# Patient Record
Sex: Female | Born: 1950
Health system: Southern US, Community
[De-identification: ages and names within clinical notes are randomized; demographics above are authoritative.]

## PROBLEM LIST (undated history)

## (undated) DIAGNOSIS — N893 Dysplasia of vagina, unspecified: Secondary | ICD-10-CM

## (undated) DIAGNOSIS — Z803 Family history of malignant neoplasm of breast: Secondary | ICD-10-CM

## (undated) DIAGNOSIS — E785 Hyperlipidemia, unspecified: Secondary | ICD-10-CM

## (undated) DIAGNOSIS — C50919 Malignant neoplasm of unspecified site of unspecified female breast: Secondary | ICD-10-CM

## (undated) DIAGNOSIS — N821 Other female urinary-genital tract fistulae: Secondary | ICD-10-CM

## (undated) DIAGNOSIS — I519 Heart disease, unspecified: Secondary | ICD-10-CM

## (undated) DIAGNOSIS — I10 Essential (primary) hypertension: Secondary | ICD-10-CM

## (undated) DIAGNOSIS — M1711 Unilateral primary osteoarthritis, right knee: Secondary | ICD-10-CM

## (undated) DIAGNOSIS — M1990 Unspecified osteoarthritis, unspecified site: Secondary | ICD-10-CM

## (undated) DIAGNOSIS — J189 Pneumonia, unspecified organism: Secondary | ICD-10-CM

## (undated) DIAGNOSIS — K5790 Diverticulosis of intestine, part unspecified, without perforation or abscess without bleeding: Secondary | ICD-10-CM

## (undated) DIAGNOSIS — D259 Leiomyoma of uterus, unspecified: Secondary | ICD-10-CM

## (undated) DIAGNOSIS — F419 Anxiety disorder, unspecified: Secondary | ICD-10-CM

## (undated) DIAGNOSIS — R7303 Prediabetes: Secondary | ICD-10-CM

## (undated) DIAGNOSIS — I340 Nonrheumatic mitral (valve) insufficiency: Secondary | ICD-10-CM

## (undated) DIAGNOSIS — Z923 Personal history of irradiation: Secondary | ICD-10-CM

## (undated) DIAGNOSIS — R519 Headache, unspecified: Secondary | ICD-10-CM

## (undated) DIAGNOSIS — Z8049 Family history of malignant neoplasm of other genital organs: Secondary | ICD-10-CM

## (undated) DIAGNOSIS — Z808 Family history of malignant neoplasm of other organs or systems: Secondary | ICD-10-CM

## (undated) DIAGNOSIS — R011 Cardiac murmur, unspecified: Secondary | ICD-10-CM

## (undated) DIAGNOSIS — Z800 Family history of malignant neoplasm of digestive organs: Secondary | ICD-10-CM

## (undated) DIAGNOSIS — K635 Polyp of colon: Secondary | ICD-10-CM

## (undated) DIAGNOSIS — K219 Gastro-esophageal reflux disease without esophagitis: Secondary | ICD-10-CM

## (undated) HISTORY — DX: Dysplasia of vagina, unspecified: N89.3

## (undated) HISTORY — PX: BREAST LUMPECTOMY: SHX2

## (undated) HISTORY — DX: Essential (primary) hypertension: I10

## (undated) HISTORY — DX: Family history of malignant neoplasm of digestive organs: Z80.0

## (undated) HISTORY — DX: Heart disease, unspecified: I51.9

## (undated) HISTORY — PX: OTHER SURGICAL HISTORY: SHX169

## (undated) HISTORY — DX: Hyperlipidemia, unspecified: E78.5

## (undated) HISTORY — DX: Unspecified osteoarthritis, unspecified site: M19.90

## (undated) HISTORY — DX: Anxiety disorder, unspecified: F41.9

## (undated) HISTORY — DX: Family history of malignant neoplasm of other genital organs: Z80.49

## (undated) HISTORY — DX: Nonrheumatic mitral (valve) insufficiency: I34.0

## (undated) HISTORY — DX: Diverticulosis of intestine, part unspecified, without perforation or abscess without bleeding: K57.90

## (undated) HISTORY — PX: BREAST EXCISIONAL BIOPSY: SUR124

## (undated) HISTORY — PX: BREAST CYST ASPIRATION: SHX578

## (undated) HISTORY — DX: Malignant neoplasm of unspecified site of unspecified female breast: C50.919

## (undated) HISTORY — DX: Polyp of colon: K63.5

## (undated) HISTORY — PX: CYSTOSCOPY: SUR368

## (undated) HISTORY — DX: Family history of malignant neoplasm of other organs or systems: Z80.8

## (undated) HISTORY — DX: Family history of malignant neoplasm of breast: Z80.3

## (undated) HISTORY — DX: Leiomyoma of uterus, unspecified: D25.9

## (undated) HISTORY — DX: Other female urinary-genital tract fistulae: N82.1

---

## 1992-03-28 DIAGNOSIS — D259 Leiomyoma of uterus, unspecified: Secondary | ICD-10-CM

## 1992-03-28 HISTORY — DX: Leiomyoma of uterus, unspecified: D25.9

## 1992-03-28 HISTORY — PX: TOTAL ABDOMINAL HYSTERECTOMY: SHX209

## 1992-04-16 HISTORY — PX: URETERAL EXPLORATION: SHX2609

## 2008-03-28 DIAGNOSIS — K635 Polyp of colon: Secondary | ICD-10-CM

## 2008-03-28 HISTORY — PX: COLONOSCOPY: SHX174

## 2008-03-28 HISTORY — PX: ESOPHAGOGASTRODUODENOSCOPY: SHX1529

## 2008-03-28 HISTORY — DX: Polyp of colon: K63.5

## 2009-06-30 ENCOUNTER — Ambulatory Visit: Payer: Self-pay | Admitting: Gynecology

## 2009-06-30 ENCOUNTER — Other Ambulatory Visit: Admission: RE | Admit: 2009-06-30 | Discharge: 2009-06-30 | Payer: Self-pay | Admitting: Gynecology

## 2009-07-15 ENCOUNTER — Ambulatory Visit: Payer: Self-pay | Admitting: Gynecology

## 2009-07-21 ENCOUNTER — Encounter: Admission: RE | Admit: 2009-07-21 | Discharge: 2009-07-21 | Payer: Self-pay | Admitting: Gynecology

## 2009-08-21 ENCOUNTER — Emergency Department (HOSPITAL_COMMUNITY): Admission: EM | Admit: 2009-08-21 | Discharge: 2009-08-21 | Payer: Self-pay | Admitting: Emergency Medicine

## 2010-03-28 HISTORY — PX: COLONOSCOPY: SHX174

## 2012-09-11 LAB — HEPATITIS B CORE ANTIBODY, IGM
Hep B C IgM: NEGATIVE
Hep B Core Total Ab: NEGATIVE

## 2012-09-11 LAB — COMPREHENSIVE METABOLIC PANEL
ALK PHOS: 78 U/L
ALT: 18 U/L (ref 7–35)
AST: 17 U/L
CALCIUM: 9 mg/dL
Creat: 0.61
Glucose: 105
Total Bilirubin: 0.3 mg/dL

## 2012-09-11 LAB — CBC
HEMOGLOBIN: 13 g/dL
WBC: 5.7
platelet count: 188

## 2012-09-11 LAB — TSH
TSH: 3.89
Thyroxine (T4): 6

## 2012-09-11 LAB — HIV 1/O/2 ABS, QUAL: HIV 1/O/2 Abs, Qual: NONREACTIVE

## 2012-09-11 LAB — SEDIMENTATION RATE: Sed Rate: 28 mm

## 2012-09-11 LAB — RPR: RPR Ser-Titr: NONREACTIVE

## 2012-09-11 LAB — HEPATITIS B SURFACE ANTIBODY,QUALITATIVE: HEP B S AB: NEGATIVE

## 2012-09-11 LAB — HEPATITIS B SURFACE ANTIGEN: HBsAg Conf: NEGATIVE

## 2012-09-11 LAB — HEMOGLOBIN A1C: A1C: 5.4

## 2013-05-24 ENCOUNTER — Ambulatory Visit (INDEPENDENT_AMBULATORY_CARE_PROVIDER_SITE_OTHER): Payer: PRIVATE HEALTH INSURANCE | Admitting: Gynecology

## 2013-05-24 ENCOUNTER — Encounter: Payer: Self-pay | Admitting: Gynecology

## 2013-05-24 VITALS — BP 146/92 | Ht 60.75 in | Wt 171.2 lb

## 2013-05-24 DIAGNOSIS — N949 Unspecified condition associated with female genital organs and menstrual cycle: Secondary | ICD-10-CM

## 2013-05-24 DIAGNOSIS — N951 Menopausal and female climacteric states: Secondary | ICD-10-CM

## 2013-05-24 DIAGNOSIS — R102 Pelvic and perineal pain: Secondary | ICD-10-CM

## 2013-05-24 DIAGNOSIS — Z01419 Encounter for gynecological examination (general) (routine) without abnormal findings: Secondary | ICD-10-CM

## 2013-05-24 DIAGNOSIS — Z79890 Hormone replacement therapy (postmenopausal): Secondary | ICD-10-CM

## 2013-05-24 DIAGNOSIS — R350 Frequency of micturition: Secondary | ICD-10-CM

## 2013-05-24 DIAGNOSIS — N952 Postmenopausal atrophic vaginitis: Secondary | ICD-10-CM | POA: Insufficient documentation

## 2013-05-24 DIAGNOSIS — Z86010 Personal history of colonic polyps: Secondary | ICD-10-CM | POA: Insufficient documentation

## 2013-05-24 LAB — URINALYSIS W MICROSCOPIC + REFLEX CULTURE
BILIRUBIN URINE: NEGATIVE
Bacteria, UA: NONE SEEN
CASTS: NONE SEEN
CRYSTALS: NONE SEEN
Glucose, UA: NEGATIVE mg/dL
KETONES UR: NEGATIVE mg/dL
Nitrite: NEGATIVE
PH: 8 (ref 5.0–8.0)
PROTEIN: NEGATIVE mg/dL
Specific Gravity, Urine: 1.014 (ref 1.005–1.030)
Urobilinogen, UA: 0.2 mg/dL (ref 0.0–1.0)

## 2013-05-24 MED ORDER — NONFORMULARY OR COMPOUNDED ITEM: Status: DC

## 2013-05-24 NOTE — Patient Instructions (Signed)
Terapia de reemplazo hormonal (Hormone Replacement Therapy) En la menopausia, su cuerpo comienza a producir menos estrgeno y progesterona. Esto provoca que el cuerpo deje de tener perodos menstruales. Esto se debe a que el estrgeno y la progesterona controlan sus perodos y su ciclo menstrual. Una falta de estrgeno puede causar sntomas tales como:  Golpes calor.  Sequedad vaginal  Piel seca.  Prdida del deseo sexual.  Riesgo de prdida de hueso (osteoporosis). Cuando esto ocurre, puede elegir realizar una terapia hormonal para volver a obtener el estrgeno perdido durante la menopausia. Cuando slo se introduce esta hormona, el procedimiento se conoce normalmente como TRE (terapia de reemplazo de estrgeno). Cuando la hormona progestina se combina con el estrgeno, el procedimiento se conoce normalmente como TH (terapia hormonal). Esto es lo que previamente se conoca como terapia de reemplazo hormonal (TRH). El profesional que le asiste le ayudar a tomar una decisin acerca de lo que resulte lo mejor para usted. La decisin de realizar una TRH cambia a menudo debido a que se realizan nuevos exmenes. Muchos estudios no ponen de acuerdo con respecto a los beneficios de realizar una terapia de reemplazo hormonal.  BENEFICIOS PROBABLES DE LA TRH QUE INCLUYEN PROTECCIN CONTRA:  Golpes de calor - Un golpe de calor es la sensacin repentina de calor sobre la cara y el cuerpo. La piel enrojece, como al sonrojarse. Estn asociados con la transpiracin y los trastornos del sueo. Las mujeres que atraviesan la menopausia pueden tener golpes de calor unas pocas veces en el mes o varios al da; esto depende de la mujer.  Osteoporosis (prdida de hueso)  El estrgeno ayuda a protegerse contra la prdida de hueso. Luego de la menopausia, los huesos de una mujer pierden calcio y se vuelven frgiles y quebradizos. Como resultado, es ms probable que el hueso se quiebre. Los que resultan afectados con  mayor frecuencia son los de la cadera, la mueca y la columna vertebral. La terapia hormonal puede ayudar a retardar la prdida de hueso luego de la menopausia. Realizar ejercicios con peso y tomar calcio con vitamina D tambin puede ayudar a prevenir la prdida de hueso. Existen medicamentos que puede prescribir el profesional que la asiste para ayudar a prevenir la osteoporosis.  Sequedad vaginal  La prdida de estrgeno produce cambios en la vagina. El recubrimiento de la misma puede volverse fino y reseco. Estos cambios pueden causar dolor y sangrado durante las relaciones sexuales. La sequedad tambin puede producir una infeccin. Puede ocasionarle ardor y picazn.  Las infecciones en las vas urinarias son ms comunes luego de la menopausia debido a la falta de estrgeno.  Otros beneficios posibles del estrgeno incluyen un cambio positivo en el humor y en la memoria de corto plazo en las mujeres. EFECTOS SECUNDARIOS Y RIESGOS  Utilizar estrgeno slo sin progesterona causa que el recubrimiento del tero crezca. Esto aumenta el riesgo de cncer endometrial. El profesional que la asiste deber darle otra hormona llamada progestina, si usted tiene tero.  Las mujeres que realizan una TH combinada (estrgeno y progestina) parecen tener un mayor riesgo de sufrir cncer de mama. El riesgo parece ser pequeo, pero aumenta a lo largo del tiempo que se realice la TH.  La terapia combinada tambin hace que el tejido mamario sea levemente ms denso, lo que hace que sea ms difcil leer mamografas (radiografas de mama).  Combinada, la terapia de estrgeno y progesterona puede realizarse todos los das, en cuyo caso podrn aparecer manchas de sangre. La TH puede realizarse   de manera cclica, en cuyo caso tendr perodos menstruales.  La TH puede aumentar el riesgo de [place], ataque cardaco, cncer de mama y formacin de cogulos en la pierna. TRATAMIENTO  Si decide realizar Ardelia Mems TH y tiene tero,  normalmente se prescribe el uso de estrgeno y progestina.  El profesional que la asiste le ayudar a decidir la mejor forma de [employer].  Lo mejor es Chief Executive Officer dosis posible que pueda ayudarla con sus sntomas y tomarlos durante la menor cantidad de tiempo posible.  La terapia hormonal puede ayudar a Banker de los problemas (sntomas) que afectan a las mujeres durante la menopausia. Antes de tomar una decisin con respecto a la TH, converse con el profesional que la asiste acerca de qu es lo mejor para usted. Mantngase bien informada y sintase cmoda con sus decisiones. INSTRUCCIONES PARA EL CUIDADO DOMICILIARIO:  [place] indicaciones del profesional con respecto a cmo Biomedical scientist.  Hgase controles de [place] regular, e incluya [place]. SOLICITE ATENCIN MDICA DE INMEDIATO SI PRESENTA:  Hemorragia vaginal anormal.  Dolor o inflamacin en las piernas, falta de aliento o Tourist information centre manager.  Mareos o dolores de [place].  Protuberancias o cambios en sus mamas o axilas.  Pronunciacin inarticulada.  Debilidad o adormecimiento en los brazos o las piernas.  Dolor, ardor o sangrado al [employer].  Dolor abdominal. Document Released: 08/31/2007 Document Revised: 06/06/2011 ExitCare Patient Information 2014 [place].

## 2013-05-24 NOTE — Progress Notes (Signed)
Debra Simon [birth year] [id number]   History:    63 y.o.  for annual gyn exam who has not been seen in her office was 2011. Her past history as follows:  In 1994 patient had an attempted laparoscopic hysterectomy with laser in [place] as a result of symptomatic leiomyomatous uteri and complications in which bilateral ureteral injuries had been reported. 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 He had done a cystoscopy right retrograde pyelogram and also had attempted ureteral stents back in 1994. He did have proceeded with exploration of the ureters and performed a right ureterotomy and repair laceration of left ureter with a ureteral neo-cystotomy. Patient has had no further problems since that time she does have frequency and occasional nocturia because of the hypertensive medications she is currently on which is Diovan 80 mg daily. She states that she has been seen a PCP in [place] but has now moved back to [place] and will need to be followed here locally. Prior to her hysterectomy in 2009 she had atypical squamous cells of undetermined significance with high-risk HPV but the followup after that we did here in our office in 2011 was normal. Patient also states that she has had in the past fine needle aspiration of bilateral breast cysts. She also stated many years ago she had a right breast biopsy which was benign. Also she has had a history of colon polyps which were detected in the past but she has informed me that she had one in [place] recently in 2004 which was benign. Her labs were less than a year ago by her PCP. Her bone density study according to her was done in 2014 and was reported to be normal.  Past medical history,surgical history, family history and social history were all reviewed and documented in the EPIC chart.  Gynecologic History No LMP recorded. Patient has had a  hysterectomy. Contraception: status post hysterectomy Last Pap: 2011. Results were: normal Last mammogram: 2014. Results were: normal  Obstetric History OB History  Gravida Para Term Preterm AB SAB TAB Ectopic Multiple Living  7 4   3 3    4     # Outcome Date GA Lbr Len/2nd Weight Sex Delivery Anes PTL Lv  7 SAB           6 SAB           5 SAB           4 PAR           3 PAR           2 PAR           1 PAR                ROS: A ROS was performed and pertinent positives and negatives are included in the history.  GENERAL: No fevers or chills. HEENT: No change in vision, no earache, sore throat or sinus congestion. NECK: No pain or stiffness. CARDIOVASCULAR: No chest pain or pressure. No palpitations. PULMONARY: No shortness of breath, cough or wheeze. GASTROINTESTINAL: No abdominal pain, nausea, vomiting or diarrhea, melena or bright red blood per rectum. GENITOURINARY: No urinary frequency, urgency, hesitancy or dysuria. MUSCULOSKELETAL: No joint or muscle pain, no back pain, no recent trauma. DERMATOLOGIC: No rash, no itching, no lesions. ENDOCRINE: No polyuria, polydipsia, no heat or cold intolerance. No recent change in weight.  HEMATOLOGICAL: No anemia or easy bruising or bleeding. NEUROLOGIC: No headache, seizures, numbness, tingling or weakness. PSYCHIATRIC: No depression, no loss of interest in normal activity or change in sleep pattern.     Exam: chaperone present  BP 146/92  Ht 5' 0.75" (1.543 m)  Wt 171 lb 3.2 oz (77.656 kg)  BMI 32.62 kg/m2  Body mass index is 32.62 kg/(m^2).  General appearance : Well developed well nourished female. No acute distress HEENT: Neck supple, trachea midline, no carotid bruits, no thyroidmegaly Lungs: Clear to auscultation, no rhonchi or wheezes, or rib retractions  Heart: Regular rate and rhythm, no murmurs or gallops Breast:Examined in sitting and supine position were symmetrical in appearance, no palpable masses or tenderness,  no  skin retraction, no nipple inversion, no nipple discharge, no skin discoloration, no axillary or supraclavicular lymphadenopathy Abdomen: no palpable masses or tenderness, no rebound or guarding Extremities: no edema or skin discoloration or tenderness  Pelvic:  [place], Urethra, Skene Glands: Within normal limits             Vagina: No gross lesions or discharge, vaginal atrophy  Cervix: Absent              Uterus: Absent  Adnexa  Without masses or tenderness  Anus and perineum  normal   Rectovaginal  normal sphincter tone without palpated masses or tenderness             Hemoccult cards provided     Assessment/Plan:  63 y.o. female for annual exam who has been receiving her medical care in [place]. Has not had a gynecological examination and several years. We did not do a Pap smear today in accordance to new guidelines. She will be referred to one of my internal medicine colleagues to monitor and treat her for her hypertension. She will go to the office in a fasting states that there will do her lab work. I have given her requisition to schedule her mammogram which is due in April. I provided her with Hemoccult cards for her to [facility] in the office for testing. There was a question whether patient's ovaries were removed the time of her hysterectomy as as she occasionally has suprapubic discomfort we are going to schedule an ultrasound the next couple weeks here in our office. We discussed importance of calcium and vitamin D in regular exercise for osteoporosis prevention. She is due for her next bone density study next year. A urinalysis was obtained today. For patient's vaginal atrophy she is going to be placed on vaginal estrogen to apply twice a week. Risks benefits and pros and cons were discussed. Literature and information was provided in [place].  Note: This dictation was prepared with  Dragon/digital dictation along withSmart phrase technology. Any transcriptional errors that result  from this process are unintentional.   Terrance Mass MD, 10:23 AM 05/24/2013

## 2013-05-26 LAB — URINE CULTURE: Colony Count: 65000

## 2013-05-27 ENCOUNTER — Other Ambulatory Visit: Payer: Self-pay | Admitting: Gynecology

## 2013-05-27 MED ORDER — NITROFURANTOIN MONOHYD MACRO 100 MG PO CAPS: 100.0000 mg | ORAL_CAPSULE | Freq: Two times a day (BID) | ORAL | Status: DC

## 2013-06-04 ENCOUNTER — Telehealth: Payer: Self-pay | Admitting: *Deleted

## 2013-06-04 NOTE — Telephone Encounter (Signed)
rx called in for 30 day supply.

## 2013-06-04 NOTE — Telephone Encounter (Signed)
Message copied by Thamas Jaegers on Tue Jun 04, 2013  1:19 PM ------      Message from: Sinclair Grooms      Created: Tue Jun 04, 2013 11:56 AM      Regarding: RX                   Sig: Estradiol .02%       1 ML Prefilled Applicator       Sig: apply vaginally twice a week       #90 Day Supply with 4 refills                    Patient states her insurance will only cover if RX is for 30 days.       Can you call in a 30 day to [facility].       Let me know and I will call patient (she is spanish speaking). Thx ------

## 2013-06-05 NOTE — Telephone Encounter (Signed)
Pharmacy called back stating medication is not covered by insurance at all. But a 3 month supply would be 49.79, claudia informed patient with this and patient is okay to pay out of pocket for medication.

## 2013-06-07 ENCOUNTER — Ambulatory Visit (INDEPENDENT_AMBULATORY_CARE_PROVIDER_SITE_OTHER): Payer: PRIVATE HEALTH INSURANCE | Admitting: Gynecology

## 2013-06-07 ENCOUNTER — Ambulatory Visit (INDEPENDENT_AMBULATORY_CARE_PROVIDER_SITE_OTHER): Payer: PRIVATE HEALTH INSURANCE

## 2013-06-07 ENCOUNTER — Encounter: Payer: Self-pay | Admitting: Gynecology

## 2013-06-07 VITALS — BP 134/88

## 2013-06-07 DIAGNOSIS — N952 Postmenopausal atrophic vaginitis: Secondary | ICD-10-CM

## 2013-06-07 DIAGNOSIS — Z780 Asymptomatic menopausal state: Secondary | ICD-10-CM

## 2013-06-07 DIAGNOSIS — R102 Pelvic and perineal pain: Secondary | ICD-10-CM

## 2013-06-07 DIAGNOSIS — N949 Unspecified condition associated with female genital organs and menstrual cycle: Secondary | ICD-10-CM

## 2013-06-07 DIAGNOSIS — N951 Menopausal and female climacteric states: Secondary | ICD-10-CM

## 2013-06-07 NOTE — Progress Notes (Signed)
   Patient presented to the office today to discuss her ultrasound since there was question whether her ovaries were removed the time of her hysterectomy in [place] several years ago. Her history was as follows:  In 1994 patient had an attempted laparoscopic hysterectomy with laser in [place] as a result of symptomatic leiomyomatous uteri and complications in which bilateral ureteral injuries had been reported. 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 He had done a cystoscopy right retrograde pyelogram and also had attempted ureteral stents back in 1994. He did have proceeded with exploration of the ureters and performed a right ureterotomy and repair laceration of left ureter with a ureteral neo-cystotomy.  Ultrasound today: Right and left ovary not seen. No apparent adnexal masses. Uterus and cervix absent  Patient has informed me that she recently started the vaginal estrogen twice a week for vaginal atrophy. She is in the process of scheduling her colonoscopy and mammogram.

## 2013-06-13 ENCOUNTER — Other Ambulatory Visit: Payer: PRIVATE HEALTH INSURANCE | Admitting: Anesthesiology

## 2013-06-13 DIAGNOSIS — Z1211 Encounter for screening for malignant neoplasm of colon: Secondary | ICD-10-CM

## 2013-07-31 ENCOUNTER — Ambulatory Visit (INDEPENDENT_AMBULATORY_CARE_PROVIDER_SITE_OTHER): Payer: PRIVATE HEALTH INSURANCE | Admitting: Family Medicine

## 2013-07-31 ENCOUNTER — Encounter: Payer: Self-pay | Admitting: Family Medicine

## 2013-07-31 VITALS — BP 138/80 | HR 72 | Temp 98.2°F | Ht 61.5 in | Wt 175.0 lb

## 2013-07-31 DIAGNOSIS — E669 Obesity, unspecified: Secondary | ICD-10-CM

## 2013-07-31 DIAGNOSIS — I10 Essential (primary) hypertension: Secondary | ICD-10-CM

## 2013-07-31 DIAGNOSIS — R7309 Other abnormal glucose: Secondary | ICD-10-CM

## 2013-07-31 DIAGNOSIS — E559 Vitamin D deficiency, unspecified: Secondary | ICD-10-CM

## 2013-07-31 DIAGNOSIS — L255 Unspecified contact dermatitis due to plants, except food: Secondary | ICD-10-CM

## 2013-07-31 DIAGNOSIS — R739 Hyperglycemia, unspecified: Secondary | ICD-10-CM

## 2013-07-31 DIAGNOSIS — L237 Allergic contact dermatitis due to plants, except food: Secondary | ICD-10-CM

## 2013-07-31 MED ORDER — VALSARTAN-HYDROCHLOROTHIAZIDE 80-12.5 MG PO TABS: 1.0000 | ORAL_TABLET | Freq: Every day | ORAL | Status: DC

## 2013-07-31 MED ORDER — TRIAMCINOLONE ACETONIDE 0.1 % EX CREA: 1.0000 "application " | TOPICAL_CREAM | Freq: Two times a day (BID) | CUTANEOUS | Status: DC

## 2013-07-31 NOTE — Assessment & Plan Note (Signed)
Chronic, stable. Continue valsartan/hctz.  Refilled today.

## 2013-07-31 NOTE — Assessment & Plan Note (Signed)
Reviewed importance of weight loss to control sugars. Last fasting sugar 105, but A1c 5.4%.

## 2013-07-31 NOTE — Assessment & Plan Note (Signed)
Recheck next blood work.

## 2013-07-31 NOTE — Assessment & Plan Note (Signed)
Continue to encourage weight loss.  Discussed incorporating regular walking into routine.  Pt has treadmill at home. Body mass index is 32.53 kg/(m^2).

## 2013-07-31 NOTE — Progress Notes (Signed)
Pre visit review using our clinic review tool, if applicable. No additional management support is needed unless otherwise documented below in the visit note. 

## 2013-07-31 NOTE — Patient Instructions (Addendum)
Llame a seguro y pregunte sobre vacuna contra shingles (zostavax). Trate triamcinolone cream para el brote. Buen doctor de ojos - Dr. Bing Plume. Puede obtener sangre en [employer]. regrese en 8-10 meses para proxima visita. Gusto conocerla hoy, llamenos con pregunta.

## 2013-07-31 NOTE — Progress Notes (Signed)
BP 138/80  Pulse 72  Temp(Src) 98.2 F (36.8 C) (Oral)  Ht 5' 1.5" (1.562 m)  Wt 175 lb (79.379 kg)  BMI 32.53 kg/m2   CC: new pt to establish care  Subjective:    Patient ID: Debra Simon, female    DOB: [birth year], 63 y.o.   MRN: [id number]  HPI: Cadee Agro is a 63 y.o. female presenting on 07/31/2013 for [place] pt to establish care referred by Dr. Cristopher Peru.  Prior received care in [place] (Dr. Collene Mares and Marcellina Millin in [place]).  Recently working outside - thinks was exposed to poison ivy.  + rash bilateral forearms as well as LLE leg.  Itchy.  Using calomine lotion.  H/o ureterovaginal fistula.  S/p repair - ?residual ureteral defect. ABDOMINAL HYSTERECTOMY Date: 1994 TAH URETERAL EXPLORATION Date: JAN [place] ON THE RIGHT, REPAIR LACERATION ON THE LEFT  Ringing in ears - has seen ENT - told nothing to do. Some insomnia - trouble falling back asleep after she wakens at 3am.  Urination wakes her up at 3 am - told possibly due to ureteral injury in past, some residual incomplete emptying Headache - triggers are sugar intake.  Hyperglycemia in the past.  HTN - occasionally bp checked at home. 130-140/70s.  No vision changes, CP/tightness, SOB, leg swelling. Attributes today's elevated reading to diet last night (m[place] for [employer]).  Wt Readings from Last 3 Encounters:  07/31/13 175 lb (79.379 kg)  05/24/13 171 lb 3.2 oz (77.656 kg)   BP Readings from Last 3 Encounters:  07/31/13 138/80  06/07/13 134/88  05/24/13 146/92   Lives with nephew Occupation: housewife Edu: GED [place] - lives intermittently to [place] where she prior lived. Activity: gardens, housework, no regular exercise Diet: good water, fruits/vegetables daily  Preventative: 06/2012 well woman 08/2012 CPE Flu - received 2014 Td - ~2003 zostavax - discussed. Pt will call insurance.  Relevant past medical, surgical, family and social  history reviewed and updated as indicated.  Allergies and medications reviewed and updated. Current Outpatient Prescriptions on File Prior to Visit  Medication Sig  . calcium carbonate (OS-CAL) 600 MG TABS tablet Take 600 mg by mouth 2 (two) times daily with a meal.  . Multiple Vitamin (MULTIVITAMIN) tablet Take 1 tablet by mouth daily.  . NONFORMULARY OR COMPOUNDED ITEM Estradiol .02% 1 ML Prefilled Applicator Sig: apply vaginally twice a week #90 Day Supply with 4 refills   No current facility-administered medications on file prior to visit.    Review of Systems Per HPI unless specifically indicated above    Objective:    BP 138/80  Pulse 72  Temp(Src) 98.2 F (36.8 C) (Oral)  Ht 5' 1.5" (1.562 m)  Wt 175 lb (79.379 kg)  BMI 32.53 kg/m2  Physical Exam  Nursing note and vitals reviewed. Constitutional: She is oriented to person, place, and time. She appears well-developed and well-nourished. No distress.  HENT:  Head: Normocephalic and atraumatic.  Nose: Nose normal.  Mouth/Throat: Uvula is midline, oropharynx is clear and moist and mucous membranes are normal. No oropharyngeal exudate, posterior oropharyngeal edema or posterior oropharyngeal erythema.  Eyes: Conjunctivae and EOM are normal. Pupils are equal, round, and reactive to light. No scleral icterus.  Neck: Normal range of motion. Neck supple.  Cardiovascular: Normal rate, regular rhythm, normal heart sounds and intact distal pulses.   No murmur heard. Pulses:      Radial pulses are 2+ on  the right side, and 2+ on the left side.  Pulmonary/Chest: Effort normal and breath sounds normal. No respiratory distress. She has no wheezes. She has no rales.  Musculoskeletal: Normal range of motion. She exhibits no edema.  Lymphadenopathy:    She has no cervical adenopathy.  Neurological: She is alert and oriented to person, place, and time.  CN grossly intact, station and gait intact  Skin: Skin is warm and dry. Rash  noted.  Erythematous pruritic papular rash - few papules on forearms, patch on L lower leg, and new pruritic papules on left leg  Psychiatric: She has a normal mood and affect. Her behavior is normal. Judgment and thought content normal.       Assessment & Plan:   Problem List Items Addressed This Visit   Obesity     Continue to encourage weight loss.  Discussed incorporating regular walking into routine.  Pt has treadmill at home. Body mass index is 32.53 kg/(m^2).    HTN (hypertension) - Primary     Chronic, stable. Continue valsartan/hctz.  Refilled today.    Relevant Medications      valsartan-hydrochlorothiazide (DIOVAN-HCT) 80-12.5 MG per tablet   Poison ivy     TCI cream prescribed today. Discussed further treatment.    Relevant Medications      [email or web address]   Vitamin D deficiency     Recheck next blood work.    Hyperglycemia     Reviewed importance of weight loss to control sugars. Last fasting sugar 105, but A1c 5.4%.        Follow up plan: Return in about 8 months (around 04/02/2014), or if symptoms worsen or fail to improve, for annual exam, prior fasting for blood work.

## 2013-07-31 NOTE — Assessment & Plan Note (Signed)
TCI cream prescribed today. Discussed further treatment.

## 2013-08-01 ENCOUNTER — Encounter: Payer: Self-pay | Admitting: Family Medicine

## 2013-08-01 ENCOUNTER — Encounter: Payer: Self-pay | Admitting: *Deleted

## 2013-08-28 ENCOUNTER — Encounter: Payer: Self-pay | Admitting: Family Medicine

## 2013-08-29 ENCOUNTER — Encounter: Payer: Self-pay | Admitting: *Deleted

## 2013-11-25 ENCOUNTER — Other Ambulatory Visit: Payer: Self-pay | Admitting: Gynecology

## 2013-11-25 DIAGNOSIS — R928 Other abnormal and inconclusive findings on diagnostic imaging of breast: Secondary | ICD-10-CM

## 2013-11-29 ENCOUNTER — Ambulatory Visit
Admission: RE | Admit: 2013-11-29 | Discharge: 2013-11-29 | Disposition: A | Discharge status: Home / Self Care | Payer: 59 | Source: Ambulatory Visit | Attending: Gynecology | Admitting: Gynecology

## 2013-11-29 ENCOUNTER — Ambulatory Visit
Admission: RE | Admit: 2013-11-29 | Discharge: 2013-11-29 | Disposition: A | Discharge status: Home / Self Care | Payer: Self-pay | Source: Ambulatory Visit | Attending: Gynecology | Admitting: Gynecology

## 2013-11-29 DIAGNOSIS — R928 Other abnormal and inconclusive findings on diagnostic imaging of breast: Secondary | ICD-10-CM

## 2014-01-27 ENCOUNTER — Encounter: Payer: Self-pay | Admitting: Family Medicine

## 2014-03-28 HISTORY — PX: COLONOSCOPY: SHX174

## 2014-04-10 ENCOUNTER — Ambulatory Visit: Payer: PRIVATE HEALTH INSURANCE | Admitting: Gynecology

## 2014-05-21 ENCOUNTER — Other Ambulatory Visit: Payer: Self-pay | Admitting: Family Medicine

## 2014-05-30 ENCOUNTER — Other Ambulatory Visit (HOSPITAL_COMMUNITY)
Admission: RE | Admit: 2014-05-30 | Discharge: 2014-05-30 | Disposition: A | Discharge status: Home / Self Care | Payer: PRIVATE HEALTH INSURANCE | Source: Ambulatory Visit | Attending: Gynecology | Admitting: Gynecology

## 2014-05-30 ENCOUNTER — Encounter: Payer: Self-pay | Admitting: Gynecology

## 2014-05-30 ENCOUNTER — Ambulatory Visit (INDEPENDENT_AMBULATORY_CARE_PROVIDER_SITE_OTHER): Payer: PRIVATE HEALTH INSURANCE | Admitting: Gynecology

## 2014-05-30 VITALS — BP 130/88 | Ht 60.5 in | Wt 168.0 lb

## 2014-05-30 DIAGNOSIS — R87810 Cervical high risk human papillomavirus (HPV) DNA test positive: Secondary | ICD-10-CM | POA: Diagnosis present

## 2014-05-30 DIAGNOSIS — N816 Rectocele: Secondary | ICD-10-CM | POA: Diagnosis not present

## 2014-05-30 DIAGNOSIS — Z01419 Encounter for gynecological examination (general) (routine) without abnormal findings: Secondary | ICD-10-CM | POA: Insufficient documentation

## 2014-05-30 DIAGNOSIS — Z79890 Hormone replacement therapy (postmenopausal): Secondary | ICD-10-CM | POA: Diagnosis not present

## 2014-05-30 DIAGNOSIS — N952 Postmenopausal atrophic vaginitis: Secondary | ICD-10-CM | POA: Diagnosis not present

## 2014-05-30 DIAGNOSIS — Z780 Asymptomatic menopausal state: Secondary | ICD-10-CM

## 2014-05-30 DIAGNOSIS — Z1151 Encounter for screening for human papillomavirus (HPV): Secondary | ICD-10-CM | POA: Diagnosis present

## 2014-05-30 NOTE — Progress Notes (Signed)
Debra Simon 12-Nov-1950 [id number]   History:    64 y.o.  for annual gyn exam with no complaints today. Her past history is as follows:  In 1994 patient had an attempted laparoscopic hysterectomy with laser in [place] as a result of symptomatic leiomyomatous uteri and complications in which bilateral ureteral injuries had been reported. 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 He had done a cystoscopy right retrograde pyelogram and also had attempted ureteral stents back in 1994. He did have proceeded with exploration of the ureters and performed a right ureterotomy and repair laceration of left ureter with a ureteral neo-cystotomy. Patient has had no further problems since that time she does have frequency and occasional nocturia because of the hypertensive medications she is currently on which is Diovan 80 mg daily. She states that she has been seen a PCP in [place] but has now moved back to [place] and will need to be followed here locally. Prior to her hysterectomy in 2009 she had atypical squamous cells of undetermined significance with high-risk HPV but the followup after that we did here in our office in 2011 was normal. Patient also states that she has had in the past fine needle aspiration of bilateral breast cysts. She also stated many years ago she had a right breast biopsy which she brought copy of the pathology report today that demonstrated that she had intraductal hyperplasia with mild epithelial atypia. Also she has had a history of colon polyps which were detected in the past but she has informed me that she had one in [place] recently in 2004 which was benign. She states that she had another colonoscopy in 2012 and that was normal. Her PCP is Dr. Danise Mina who recently had done her blood work and is treating her hypertension and vitamin D deficiency.   Past medical  history,surgical history, family history and social history were all reviewed and documented in the EPIC chart.  Gynecologic History No LMP recorded. Patient has had a hysterectomy. Contraception: status post hysterectomy Last Pap: Several years ago. Results were: Normal reported by patient in [place] mammogram: 2015. Results were: Normal but dense had three-dimensional mammogram  Obstetric History OB History  Gravida Para Term Preterm AB SAB TAB Ectopic Multiple Living  7 4   3 3    4     # Outcome Date GA Lbr Len/2nd Weight Sex Delivery Anes PTL Lv  7 SAB           6 SAB           5 SAB           4 Para           3 Para           2 Para           1 Para                ROS: A ROS was performed and pertinent positives and negatives are included in the history.  GENERAL: No fevers or chills. HEENT: No change in vision, no earache, sore throat or sinus congestion. NECK: No pain or stiffness. CARDIOVASCULAR: No chest pain or pressure. No palpitations. PULMONARY: No shortness of breath, cough or wheeze. GASTROINTESTINAL: No abdominal pain, nausea, vomiting or diarrhea, melena or bright red blood per rectum. GENITOURINARY: No urinary frequency, urgency, hesitancy or dysuria. MUSCULOSKELETAL:  No joint or muscle pain, no back pain, no recent trauma. DERMATOLOGIC: No rash, no itching, no lesions. ENDOCRINE: No polyuria, polydipsia, no heat or cold intolerance. No recent change in weight. HEMATOLOGICAL: No anemia or easy bruising or bleeding. NEUROLOGIC: No headache, seizures, numbness, tingling or weakness. PSYCHIATRIC: No depression, no loss of interest in normal activity or change in sleep pattern.     Exam: chaperone present  BP 130/88 mmHg  Ht 5' 0.5" (1.537 m)  Wt 168 lb (76.204 kg)  BMI 32.26 kg/m2  Body mass index is 32.26 kg/(m^2).  General appearance : Well developed well nourished female. No acute distress HEENT: Eyes: no retinal hemorrhage or exudates,  Neck supple,  trachea midline, no carotid bruits, no thyroidmegaly Lungs: Clear to auscultation, no rhonchi or wheezes, or rib retractions  Heart: Regular rate and rhythm, no murmurs or gallops Breast:Examined in sitting and supine position were symmetrical in appearance, no palpable masses or tenderness,  no skin retraction, no nipple inversion, no nipple discharge, no skin discoloration, no axillary or supraclavicular lymphadenopathy Abdomen: no palpable masses or tenderness, no rebound or guarding Extremities: no edema or skin discoloration or tenderness  Pelvic:  Bartholin, Urethra, Skene Glands: Within normal limits             Vagina: No gross lesions or discharge, first-degree rectocele  Cervix: Absent  Uterus absent  Adnexa  Without masses or tenderness  Anus and perineum  normal   Rectovaginal  normal sphincter tone without palpated masses or tenderness             Hemoccult cards provided     Assessment/Plan:  64 y.o. female for annual exam with asymptomatic first-degree rectocele. Patient menopausal due for bone density study July of this year. Patient was reminded on the importance of calcium vitamin D and regular exercise for osteoporosis prevention. Pap smear with HPV screening was done today and then we will adhere to the guidelines once we have a report in our records now that she lives in [place]. She was provided with fecal Hemoccult cards to submit to the office for testing. She will need a colonoscopy next year. She was reminded to do her monthly breast exams. Her PCP has been doing her blood work. She will continue to use the vaginal estrogen twice a week for vaginal atrophy.   Terrance Mass MD, 10:36 AM 05/30/2014

## 2014-05-30 NOTE — Addendum Note (Signed)
Addended by: Thurnell Garbe A on: 05/30/2014 11:05 AM   Modules accepted: SmartSet

## 2014-05-30 NOTE — Patient Instructions (Addendum)
Vacuna contra la culebrilla, Lo que debe saber (Shingles Vaccine, What You Need to Know) QU ES LA CULEBRILLA?  Es una erupcin dolorosa de la piel, en la que aparecen ampollas. Esta enfermedad tambin se denomina herpes zoster o zoster.  Se trata de una erupcin que aparece en un lado del rostro o del cuerpo y dura entre 2 y 4 semanas. El sntoma principal es el dolor, que puede ser bastante intenso. Otros sntomas son fiebre, cefalea, escalofros y malestar en el estmago. En casos raros, esta infeccin puede causar neumona, problemas auditivos, ceguera, inflamacin cerebral (encefalitis) o la muerte.  En 1 de cada 5 personas, el dolor intenso puede persistir an despus que la erupcin desaparezca. Este dolor se denomina neurlagia post herptica.  La causa de la culebrilla es el virus varicela-zoster. Este es el mismo virus que causa la varicela. Slo las personas que han sufrido varicela o que han recibido la vacuna contra la varicela pueden tener culebrilla. El virus permanece en el organismo. Puede volver a aparecer muchos aos despus para causar culebrilla.  La culebrilla no se contagia. Sin embargo, una persona que nunca sufri varicela (ni recibi la vacuna) podra contraer varicela contagindose de alguien que padece culebrilla. Esto no es frecuente.  La culebrilla es ms frecuente entre las personas mayores de 50 aos que entre los ms jvenes. Tambin es ms frecuente en personas cuyo sistema inmunolgico est debilitado debido a una enfermedad como el cncer o medicamentos como los corticoides o las drogas utilizadas en quimioterapia.  Al menos 1 milln de personas contrae culebrilla cada ao en los [place]. VACUNA CONTRA LA CULEBRILLA  Esta vacuna fue patentada en 2006. En los ensayos clnicos, se demostr que la vacuna prevena la culebrilla en alrededor de la mitad de las personas. Tambin reduce el dolor asociado a este trastorno.  Se indica una nica dosis de vacuna  en adultos de ms de 60 aos. ALGUNAS PERSONAS NO DEBEN RECIBIR LA VACUNA O DEBEN ESPERAR No deben vacunarse las personas que:  Alguna vez hayan sufrido una reaccin alrgica a la gelatina, al antibitico neomicina o a cualquier otro componente de la vacuna. Si observa una reaccin alrgica grave, comunquese con el mdico.  Su sistema inmunolgico est debilitado debido a:  SIDA u otra enfermedad que afecte el sistema inmunolgico.  Tratamientos con drogas que afecten el sistema inmunolgico como los corticoides.  Tratamientos para el cncer como la radiacin o la quimioterapia.  Tienen una historia de cncer que haya afectado la mdula sea o el sistema linftico, como leucemia o linfoma.  Estn embarazadas o desean estarlo. Las que deseen quedar embarazadas no deben hacerlo hasta al menos 4 semanas luego de recibir esta vacuna. Las personas con enfermedades menores como un resfro, pueden vacunarse. Las personas que sufren enfermedades moderadas o graves deben esperar hasta su recuperacin antes de recibir la vacuna. Aqu se incluye a quienes presenten una temperatura de 101.3 F (38.5 C). CULES SON LOS RIESGOS DE LA VACUNA CONTRA LA CULEBRILLA?  Una vacuna, como cualquier medicamento, puede causar problemas serios, como por ejemplo reacciones alrgicas graves. Sin embargo, el riesgo de que cualquier vacuna cause un dao grave, o la muerte, es extremadamente pequeo.  No se han asociado reacciones de importancia a estas vacunas. Problemas leves  Enrojecimiento, dolor, hinchazn, o picazn en el lugar de la inyeccin (en 1 de cada 3 personas).  Cefalea (en aproximadamente 1 de cada 70 personas). Como todas las vacunas, esta vacuna sigue siendo controlada para hallar problemas   infrecuentes o graves. QU DEBO HACER EN CASO DE UNA REACCIN MODERADA O GRAVE? Qu debo observar? Cualquier estado anormal como una reaccin alrgica grave o fiebre alta. Si ocurre una reaccin alrgica  grave, ocurrir Camera operator unos pocos minutos hasta una hora despus de la aplicacin de la inyeccin. Signos de reaccin alrgica grave que presente dificultad para respirar, debilidad, ronquera o silbidos, ritmo cardaco acelerado, ronchas, mareos, palidez, o hinchazn de la garganta.  Qu debo hacer?  Comunquese con el profesional que lo asiste o lleve inmediatamente a la persona al mdico.  Cuntele al mdico lo que ha sucedido, la fecha y momento en el que ocurri y cundo se aplic la vacuna.  Pdale a su mdico, enfermera o el departamento de salud que informen la reaccin llenando el formulario de Vaccine Adverse Event Report (Informe de reacciones adversas a las vacunas - VAERS). O, puede enviar este informe a travs de la pgina web del VAERS al [email or web address] o llamando al [phone]. El VAERS no proporciona orientacin mdica. CMO PUEDO SABER MS?  El profesional podr darle el prospecto de la vacuna o sugerirle otras fuentes de informacin.  Comunquese con los Doctor, hospital for [employer] and Prevention (Centros para el control y la prevencin de enfermedades, CDC).  Llame al 779-795-0101 (1-800-CDC-INFO).  Visite los sitios web de [employer] DiningCalendar.com.au. CDC Shingles Vaccine-Spanish VIS (01/01/08) Document Released: 08/31/2007 Document Revised: 06/06/2011 ExitCare Patient Information 2015 [place], [place]. This information is not intended to replace advice given to you by your health care provider. Make sure you discuss any questions you have with your health care provider. Densitometra sea  (Bone Densitometry) La densitometra sea es una radiografa especial que mide la densidad de los huesos y se [place] para predecir el riesgo de fracturas seas. Esta estudio se [place] para Actor contenido mineral y la densidad de los huesos para diagnosticar osteoporosis. La osteoporosis es la prdida de tejido seo que hace que el hueso se  debilite. Generalmente ocurre en las mujeres que entran en la menopausia. Pero tambin pueden sufrirla los hombres y personas con otras enfermedades.  PREPARACIN PARA LA PRUEBA  No es necesaria la preparacin.  Linzie Collin EXAMINARSE?   Todas las [employer] de 92 aos.  Las mujeres posmenopusicas (1 a 4 aos) con factores de riesgo para osteoporosis.  Las personas que han sufrido fracturas previas realizando actividades normales.  Las personas de contextura corporal delgada (menos de 127 libras [63.5 kg] o con un ndice de masa corporal [IMC] de menos de 21).  Las personas que tengan un padre que haya sufrido una fractura de cadera o que tengan antecedentes de osteoporosis.  Los fumadores.  Las personas que sufren artritis reumatoidea.  Los que consumen alcohol en exceso (ms de [place]).  Las mujeres con menopausia temprana. CUNDO DEBE REALIZAR UN [place]?  Las guas actuales sugieren que se debe esperar por lo menos 2 aos antes de repetir una prueba de densidad sea, si la primera fue normal. Algunos estudios recientes indican que las mujeres con densidad sea normal pueden esperar unos aos antes de repetir un estudio de densitometra sea. Comente estos temas con su mdico.  HALLAZGOS NORMALES:   Normal: menos de una desviacin estndar por debajo de lo normal (superior a -1).  Osteopenia:  1 a 2,5 desviaciones estndar por debajo de lo normal (-1 a -2,5).  Osteoporosis: ms de 2,5 desviaciones estndar por debajo de lo normal (menos de -2,5). Los [employer] se  informan como una "puntuacin T" y Ardelia Mems "puntuacin Z". La puntuacin T es el nmero que compara la densidad sea con la densidad sea de las mujeres jvenes y sanas. La puntuacin Z es un nmero que compara la densidad sea con las puntuaciones de mujeres de la misma [place], gnero y International aid/development worker.  Los rangos para los resultados normales pueden variar entre diferentes laboratorios  y hospitales. Consulte siempre con su mdico despus de Psychologist, counselling estudio para Personal assistant significado de los [place] y si los valores se consideran "dentro de los lmites normales".  SIGNIFICADO DEL ESTUDIO  El mdico leer los resultados y Teacher, English as a foreign language con usted la importancia y el significado de los [place], as como las opciones de tratamiento y la necesidad de pruebas adicionales, si fuera necesario.  OBTENCIN DE LOS RESULTADOS DE LAS PRUEBAS  Es su responsabilidad retirar el resultado del [place]. Consulte en el laboratorio cuando y cmo podr [employer].  Document Released: 12/07/2011 [facility] Patient Information 2015 [place]. This information is not intended to replace advice given to you by your health care provider. Make sure you discuss any questions you have with your health care provider.

## 2014-06-02 LAB — CYTOLOGY - PAP

## 2014-06-17 ENCOUNTER — Other Ambulatory Visit: Payer: Self-pay | Admitting: Family Medicine

## 2014-06-27 ENCOUNTER — Ambulatory Visit (INDEPENDENT_AMBULATORY_CARE_PROVIDER_SITE_OTHER): Payer: Managed Care, Other (non HMO) | Admitting: Gynecology

## 2014-06-27 ENCOUNTER — Encounter: Payer: Self-pay | Admitting: Gynecology

## 2014-06-27 VITALS — BP 132/86

## 2014-06-27 DIAGNOSIS — R896 Abnormal cytological findings in specimens from other organs, systems and tissues: Secondary | ICD-10-CM | POA: Diagnosis not present

## 2014-06-27 DIAGNOSIS — R87629 Unspecified abnormal cytological findings in specimens from vagina: Secondary | ICD-10-CM

## 2014-06-27 NOTE — Progress Notes (Signed)
   Patient is a 64 year old who presented to the office today for colposcopic evaluation as a result of her Pap smear done at time of her annual exam on March 4 which demonstrated the following:  Diagnosis NEGATIVE FOR INTRAEPITHELIAL LESIONS OR MALIGNANCY. [name] Cytotechnologist Electronic Signature (Case signed 06/02/2014) Source Vaginal Pap [ThinPrep Imaged]  Ancillary Testing HPV 16,18/45 Genotyping NEGATIVE for HPV 16 & 18/45 Completed by PT2 on 2014-06-05 HPV High Risk **DETECTED**  Patient brought copies of previous Pap smears which will be scanned into her record but in summary as follows: Pap smear 2009 negative but positive HPV Pap smear January 2010 ASCUS positive HPV Pap smear October 2010 ASCUS with positive HPV  Patient underwent a detail colposcopic evaluation. The external genitalia, perineum, perirectal region were inspected with no lesions seen. The speculum was introduced in the vagina. The entire vagina was inspected and no lesions were noted or in the vaginal cuff even after applying acetic acid.  Assessment/plan: Patient with past history of ASCUS and positive HPV most recent Pap smear here in the office was normal but HPV was detected. HPV 16, 18 and 45 were not detected. We discussed the new ASCCP guidelines. She will return back in one year for her Pap smear with HPV screen. Literature information was provided in [place].

## 2014-06-27 NOTE — Patient Instructions (Signed)
Colposcopa, Cuidado posterior (Colposcopy, Care After) La colposcopa es un procedimiento en el que se utiliza una herramienta especial para magnificar la superficie del cuello del tero. Tambin es posible que se tome una muestra de tejido (biopsia). Esta muestra se observar para identificar la presencia de cncer cervical u otros problemas. Despus del procedimiento:  Podr sentir algunos clicos.  Recustese algunos minutos si se siente mareada.  Podr tener un sangrado que debera detenerse luego de [place]. CUIDADOS EN EL HOGAR  No tenga relaciones sexuales ni use tampones durante 2 o 3 das o segn le hayan indicado.  Slo tome medicamentos como lo indique su mdico.  Contine tomando las pastillas anticonceptivas de la forma habitual. Averige los [place] de su anlisis Pregunte cundo estarn [employer] del examen. Asegrese de [employer]. SOLICITE AYUDA DE INMEDIATO SI:  Tiene un sangrado abundante o elimina cogulos.  Su temperatura es de 102 F (38.9 C) o mayor.  Margette Fast secrecin vaginal anormal.  Tiene clicos que no se [employer].  Siente mareos, vrtigo o pierde el conocimiento (se desmaya). ASEGRESE DE QUE:   Comprende estas instrucciones.  Controlar su enfermedad.  Solicitar ayuda de inmediato si no mejora o si empeora. Document Released: 04/16/2010 Document Revised: 06/06/2011 [facility] Patient Information 2015 [place]. This information is not intended to replace advice given to you by your health care provider. Make sure you discuss any questions you have with your health care provider.

## 2014-12-04 ENCOUNTER — Other Ambulatory Visit: Payer: Self-pay | Admitting: Family Medicine

## 2014-12-18 ENCOUNTER — Other Ambulatory Visit: Payer: Self-pay

## 2014-12-18 DIAGNOSIS — Z1231 Encounter for screening mammogram for malignant neoplasm of breast: Secondary | ICD-10-CM

## 2014-12-23 ENCOUNTER — Other Ambulatory Visit: Payer: Self-pay | Admitting: Gynecology

## 2014-12-23 ENCOUNTER — Ambulatory Visit (INDEPENDENT_AMBULATORY_CARE_PROVIDER_SITE_OTHER): Payer: Managed Care, Other (non HMO)

## 2014-12-23 DIAGNOSIS — Z780 Asymptomatic menopausal state: Secondary | ICD-10-CM

## 2014-12-23 DIAGNOSIS — Z79890 Hormone replacement therapy (postmenopausal): Secondary | ICD-10-CM | POA: Diagnosis not present

## 2014-12-23 DIAGNOSIS — Z13820 Encounter for screening for osteoporosis: Secondary | ICD-10-CM

## 2015-01-13 ENCOUNTER — Other Ambulatory Visit: Payer: Managed Care, Other (non HMO) | Admitting: Anesthesiology

## 2015-01-13 DIAGNOSIS — Z1211 Encounter for screening for malignant neoplasm of colon: Secondary | ICD-10-CM | POA: Diagnosis not present

## 2015-01-15 ENCOUNTER — Ambulatory Visit
Admission: RE | Admit: 2015-01-15 | Discharge: 2015-01-15 | Disposition: A | Discharge status: Home / Self Care | Payer: Managed Care, Other (non HMO) | Source: Ambulatory Visit

## 2015-01-15 DIAGNOSIS — Z1231 Encounter for screening mammogram for malignant neoplasm of breast: Secondary | ICD-10-CM

## 2015-01-16 ENCOUNTER — Ambulatory Visit: Payer: PRIVATE HEALTH INSURANCE

## 2015-01-21 ENCOUNTER — Ambulatory Visit: Payer: PRIVATE HEALTH INSURANCE

## 2015-06-12 ENCOUNTER — Encounter: Payer: Managed Care, Other (non HMO) | Admitting: Gynecology

## 2015-06-23 ENCOUNTER — Emergency Department (HOSPITAL_COMMUNITY): Payer: Managed Care, Other (non HMO)

## 2015-06-23 ENCOUNTER — Emergency Department (HOSPITAL_COMMUNITY)
Admission: EM | Admit: 2015-06-23 | Discharge: 2015-06-24 | Disposition: A | Discharge status: Home / Self Care | Payer: Managed Care, Other (non HMO) | Attending: Emergency Medicine | Admitting: Emergency Medicine

## 2015-06-23 ENCOUNTER — Encounter (HOSPITAL_COMMUNITY): Payer: Self-pay | Admitting: *Deleted

## 2015-06-23 DIAGNOSIS — Z8742 Personal history of other diseases of the female genital tract: Secondary | ICD-10-CM | POA: Diagnosis not present

## 2015-06-23 DIAGNOSIS — R42 Dizziness and giddiness: Secondary | ICD-10-CM | POA: Insufficient documentation

## 2015-06-23 DIAGNOSIS — Z8739 Personal history of other diseases of the musculoskeletal system and connective tissue: Secondary | ICD-10-CM | POA: Insufficient documentation

## 2015-06-23 DIAGNOSIS — H9319 Tinnitus, unspecified ear: Secondary | ICD-10-CM | POA: Diagnosis not present

## 2015-06-23 DIAGNOSIS — E876 Hypokalemia: Secondary | ICD-10-CM

## 2015-06-23 DIAGNOSIS — Z86010 Personal history of colonic polyps: Secondary | ICD-10-CM | POA: Diagnosis not present

## 2015-06-23 DIAGNOSIS — Z79899 Other long term (current) drug therapy: Secondary | ICD-10-CM | POA: Insufficient documentation

## 2015-06-23 DIAGNOSIS — Z86018 Personal history of other benign neoplasm: Secondary | ICD-10-CM | POA: Diagnosis not present

## 2015-06-23 DIAGNOSIS — I10 Essential (primary) hypertension: Secondary | ICD-10-CM | POA: Insufficient documentation

## 2015-06-23 LAB — CBC WITH DIFFERENTIAL/PLATELET
BASOS ABS: 0 10*3/uL (ref 0.0–0.1)
Basophils Relative: 0 %
EOS PCT: 2 %
Eosinophils Absolute: 0.1 10*3/uL (ref 0.0–0.7)
HCT: 38.9 % (ref 36.0–46.0)
Hemoglobin: 13.2 g/dL (ref 12.0–15.0)
LYMPHS ABS: 1.6 10*3/uL (ref 0.7–4.0)
LYMPHS PCT: 26 %
MCH: 29.5 pg (ref 26.0–34.0)
MCHC: 33.9 g/dL (ref 30.0–36.0)
MCV: 87 fL (ref 78.0–100.0)
MONO ABS: 0.4 10*3/uL (ref 0.1–1.0)
Monocytes Relative: 7 %
Neutro Abs: 3.9 10*3/uL (ref 1.7–7.7)
Neutrophils Relative %: 65 %
PLATELETS: 218 10*3/uL (ref 150–400)
RBC: 4.47 MIL/uL (ref 3.87–5.11)
RDW: 12.8 % (ref 11.5–15.5)
WBC: 6 10*3/uL (ref 4.0–10.5)

## 2015-06-23 MED ORDER — MECLIZINE HCL 25 MG PO TABS
25.0000 mg | ORAL_TABLET | Freq: Once | ORAL | Status: AC
  Administered 2015-06-23: 25 mg via ORAL
  Filled 2015-06-23: qty 1

## 2015-06-23 NOTE — ED Notes (Signed)
MD at bedside. 

## 2015-06-23 NOTE — ED Notes (Signed)
EKG per MD order..pt currently completing MRI scan. ENM

## 2015-06-23 NOTE — ED Notes (Signed)
Will address orders when patient returns from MRI

## 2015-06-23 NOTE — ED Notes (Signed)
Pt sent from [facility] to [facility] for evaluation for stroke; pt declined EMS transfer and was brought here via family; pt c/o dizziness since Sunday am; pt states that she cannot look up or down due to the dizziness; pt denies weakness or syncopal episode; pt states that she is having trouble with stairs due to the dizziness but is able to ambulate walking forward without difficulty; pt denies vomiting; pt states that she has been nauseous in the mornings; denies diarrhea; no weakness or drift noted to extremities; pt with normal facial movement

## 2015-06-23 NOTE — ED Provider Notes (Signed)
CSN: [id number]     Arrival date & time 06/23/15  2058 History   First MD Initiated Contact with Patient 06/23/15 2123     Chief Complaint  Patient presents with  . Dizziness    Patient speaks primarily Spanish and was translated by family member. The history is provided by the patient.  Patient presents with dizziness. For the last 3 days she's had the feeling as if she was moving around. It is worse if she looks up or down. Worse if she turns her head. Minimal headache. No localizing numbness or weakness. States she feels somewhat unsteady with walking but she relates to the dizziness. No chest pain. No trouble breathing. No lightheadedness. She has had a sensation of movement around once in the past. She states that she's had ringing in both her ears without pain. Also states she had a cold around 3 weeks ago.  Past Medical History  Diagnosis Date  . Hypertension   . Colon polyps 2010  . Uterine fibroid 1994  . Ureterovaginal fistula   . Arthritis    Past Surgical History  Procedure Laterality Date  . Cystoscopy    . Abdominal hysterectomy  1994    TAH  . Ureteral exploration  [facility] [place] ON THE RIGHT, REPAIR LACERATION ON THE LEFT  . Colonoscopy  2010    diverticulosis, polyp  . Esophagogastroduodenoscopy  2010    gastritis  . Colonoscopy  2012    WNL, rec rpt 7 yrs   Family History  Problem Relation Age of Onset  . Diabetes Mother   . Hypertension Mother   . Cancer Father     brain  . Hypertension Sister   . Diabetes Brother   . Cancer Paternal Aunt     breast  . Cancer Paternal Uncle     stomach  . Diabetes Sister   . Cancer Paternal Aunt     uterine  . CAD Neg Hx   . Stroke Neg Hx    Social History  Substance Use Topics  . Smoking status: Never Smoker   . Smokeless tobacco: Never Used  . Alcohol Use: 0.0 oz/week    0 Standard drinks or equivalent per week     Comment: rare on holidays   OB History    Gravida Para Term Preterm  AB TAB SAB Ectopic Multiple Living   7 4   3  3   4      Review of Systems  Constitutional: Negative for activity change and appetite change.  HENT: Positive for tinnitus.   Eyes: Negative for pain.  Respiratory: Negative for chest tightness and shortness of breath.   Cardiovascular: Negative for chest pain and leg swelling.  Gastrointestinal: Negative for abdominal pain.  Genitourinary: Negative for flank pain.  Musculoskeletal: Negative for back pain and neck stiffness.  Skin: Negative for rash.  Neurological: Positive for dizziness. Negative for weakness, numbness and headaches.  Psychiatric/Behavioral: Negative for behavioral problems.      Allergies  Review of patient's allergies indicates no known allergies.  Home Medications   Prior to Admission medications   Medication Sig Start Date End Date Taking? Authorizing Provider  cholecalciferol (VITAMIN D) 1000 UNITS tablet Take 1,000 Units by mouth daily.   Yes Historical Provider, MD  Multiple Vitamin (MULTIVITAMIN) tablet Take 1 tablet by mouth daily.   Yes Historical Provider, MD  valsartan-hydrochlorothiazide (DIOVAN-HCT) 80-12.5 MG per tablet Take one tablet daily **MUST HAVE PHYSICAL FOR FURTHER REFILLS**  12/04/14  Yes Ria Bush, MD  meclizine (ANTIVERT) 25 MG tablet Take 1 tablet (25 mg total) by mouth 3 (three) times daily as needed for dizziness. 06/24/15   Davonna Belling, MD  NONFORMULARY OR COMPOUNDED ITEM Estradiol .02% 1 ML Prefilled Applicator Sig: apply vaginally twice a week #90 Day Supply with 4 refills Patient not taking: Reported on 05/30/2014 05/24/13   Terrance Mass, MD  triamcinolone cream (KENALOG) 0.1 % Apply 1 application topically 2 (two) times daily. Apply to AA. Patient not taking: Reported on 05/30/2014 07/31/13   Ria Bush, MD   BP 132/65 mmHg  Pulse 60  Temp(Src) 98.1 F (36.7 C) (Oral)  Resp 18  Ht 5\' 2"  (1.575 m)  Wt 167 lb (75.751 kg)  BMI 30.54 kg/m2  SpO2 95% Physical Exam   Constitutional: She is oriented to person, place, and time. She appears well-developed and well-nourished.  HENT:  Head: Normocephalic and atraumatic.  Mild haziness a left TM without erythema or swelling. Right TM normal.  Eyes: Pupils are equal, round, and reactive to light.  Neck: Normal range of motion. Neck supple.  Cardiovascular: Normal rate, regular rhythm and normal heart sounds.   No murmur heard. Pulmonary/Chest: Effort normal and breath sounds normal. No respiratory distress. She has no wheezes. She has no rales.  Abdominal: Soft. Bowel sounds are normal. She exhibits no distension. There is no tenderness. There is no rebound and no guarding.  Musculoskeletal: Normal range of motion.  Neurological: She is alert and oriented to person, place, and time. No cranial nerve deficit.  Finger-nose intact bilaterally. Some nystagmus with gaze, particularly looking up and down into the right. Some unsteadiness with eyes closed and feet together and palms out.  Skin: Skin is warm and dry.  Psychiatric: She has a normal mood and affect. Her speech is normal.  Nursing note and vitals reviewed.   ED Course  Procedures (including critical care time) Labs Review Labs Reviewed  COMPREHENSIVE METABOLIC PANEL - Abnormal; Notable for the following:    Potassium 3.3 (*)    Glucose, Bld 110 (*)    All other components within normal limits  CBC WITH DIFFERENTIAL/PLATELET    Imaging Review Mr Brain Wo Contrast  06/23/2015  CLINICAL DATA:  Initial evaluation for acute vertigo. Evaluate for stroke. EXAM: MRI HEAD WITHOUT CONTRAST TECHNIQUE: Multiplanar, multiecho pulse sequences of the brain and surrounding structures were obtained without intravenous contrast. COMPARISON:  None. FINDINGS: Cerebral volume within normal limits for patient age. No significant white matter disease present. Small remote lacunar infarct within the left lentiform nucleus. No abnormal foci of restricted diffusion to  suggest acute intracranial infarct. Gray-white matter differentiation well maintained. Major intracranial vascular flow voids are preserved. No acute intracranial hemorrhage. No mass lesion, midline shift, or mass effect. No hydrocephalus. No extra-axial fluid collection. Major dural sinuses are grossly patent. Craniocervical junction within normal limits. Visualized upper cervical spine unremarkable. Incidental note made of a partially empty sella. No acute abnormality about the orbits. Paranasal sinuses are clear. No mastoid effusion. Inner ear structures grossly normal. Bone marrow signal intensity within normal limits. No scalp soft tissue abnormality. IMPRESSION: 1. No acute intracranial infarct or other process identified. 2. Small remote lacunar infarct within the left lentiform nucleus. 3. Otherwise normal brain MRI. Electronically Signed   By: Jeannine Boga M.D.   On: 06/23/2015 23:07   I have personally reviewed and evaluated these images and lab results as part of my medical decision-making.   EKG  Interpretation   Date/Time:  Tuesday June 23 2015 22:54:35 EDT Ventricular Rate:  62 PR Interval:  159 QRS Duration: 98 QT Interval:  452 QTC Calculation: 459 R Axis:   -7 Text Interpretation:  Sinus rhythm Confirmed by Alvino Chapel  MD, Ovid Curd  [phone]) on 06/23/2015 11:00:51 PM      MDM   Final diagnoses:  Vertigo  Hypokalemia   Patient with vertigo. Does have tenderness. Due to age MRI done and did not show acute stroke. Feels better after Antivert and will be discharged home to follow with ENT as needed. Apparent peripheral vertigo.     Davonna Belling, MD 06/24/15 [phone]

## 2015-06-23 NOTE — ED Notes (Signed)
Pt transported to MRI 

## 2015-06-24 LAB — COMPREHENSIVE METABOLIC PANEL
ALT: 18 U/L (ref 14–54)
AST: 18 U/L (ref 15–41)
Albumin: 4.4 g/dL (ref 3.5–5.0)
Alkaline Phosphatase: 63 U/L (ref 38–126)
Anion gap: 10 (ref 5–15)
BILIRUBIN TOTAL: 0.5 mg/dL (ref 0.3–1.2)
BUN: 14 mg/dL (ref 6–20)
CO2: 26 mmol/L (ref 22–32)
CREATININE: 0.47 mg/dL (ref 0.44–1.00)
Calcium: 9.7 mg/dL (ref 8.9–10.3)
Chloride: 105 mmol/L (ref 101–111)
GFR calc Af Amer: >60 mL/min (ref >60)
Glucose, Bld: 110 mg/dL — ABNORMAL HIGH (ref 65–99)
Potassium: 3.3 mmol/L — ABNORMAL LOW (ref 3.5–5.1)
Sodium: 141 mmol/L (ref 135–145)
TOTAL PROTEIN: 7.3 g/dL (ref 6.5–8.1)

## 2015-06-24 MED ORDER — MECLIZINE HCL 25 MG PO TABS: 25.0000 mg | ORAL_TABLET | Freq: Three times a day (TID) | ORAL | Status: DC | PRN

## 2015-06-24 NOTE — Discharge Instructions (Signed)
Vertigo Vertigo means you feel like you or your surroundings are moving when they are not. Vertigo can be dangerous if it occurs when you are at work, driving, or performing difficult activities.  CAUSES  Vertigo occurs when there is a conflict of signals sent to your brain from the visual and sensory systems in your body. There are many different causes of vertigo, including:  Infections, especially in the inner ear.  A bad reaction to a drug or misuse of alcohol and medicines.  Withdrawal from drugs or alcohol.  Rapidly changing positions, such as lying down or rolling over in bed.  A migraine headache.  Decreased blood flow to the brain.  Increased pressure in the brain from a head injury, infection, tumor, or bleeding. SYMPTOMS  You may feel as though the world is spinning around or you are falling to the ground. Because your balance is upset, vertigo can cause nausea and vomiting. You may have involuntary eye movements (nystagmus). DIAGNOSIS  Vertigo is usually diagnosed by physical exam. If the cause of your vertigo is unknown, your caregiver may perform imaging tests, such as an MRI scan (magnetic resonance imaging). TREATMENT  Most cases of vertigo resolve on their own, without treatment. Depending on the cause, your caregiver may prescribe certain medicines. If your vertigo is related to body position issues, your caregiver may recommend movements or procedures to correct the problem. In rare cases, if your vertigo is caused by certain inner ear problems, you may need surgery. HOME CARE INSTRUCTIONS   Follow your caregiver's instructions.  Avoid driving.  Avoid operating heavy machinery.  Avoid performing any tasks that would be dangerous to you or others during a vertigo episode.  Tell your caregiver if you notice that certain medicines seem to be causing your vertigo. Some of the medicines used to treat vertigo episodes can actually make them worse in some people. SEEK  IMMEDIATE MEDICAL CARE IF:   Your medicines do not relieve your vertigo or are making it worse.  You develop problems with talking, walking, weakness, or using your arms, hands, or legs.  You develop severe headaches.  Your nausea or vomiting continues or gets worse.  You develop visual changes.  A family member notices behavioral changes.  Your condition gets worse. MAKE SURE YOU:  Understand these instructions.  Will watch your condition.  Will get help right away if you are not doing well or get worse.   This information is not intended to replace advice given to you by your health care provider. Make sure you discuss any questions you have with your health care provider.   Document Released: 12/22/2004 Document Revised: 06/06/2011 Document Reviewed: 07/07/2014 Elsevier Interactive Patient Education 2016 [employer].  Hypokalemia Hypokalemia means that the amount of potassium in the blood is lower than normal.Potassium is a chemical, called an electrolyte, that helps regulate the amount of fluid in the body. It also stimulates muscle contraction and helps nerves function properly.Most of the body's potassium is inside of cells, and only a very small amount is in the blood. Because the amount in the blood is so small, minor changes can be life-threatening. CAUSES  Antibiotics.  Diarrhea or vomiting.  Using laxatives too much, which can cause diarrhea.  Chronic kidney disease.  Water pills (diuretics).  Eating disorders (bulimia).  Low magnesium level.  Sweating a lot. SIGNS AND SYMPTOMS  Weakness.  Constipation.  Fatigue.  Muscle cramps.  Mental confusion.  Skipped heartbeats or irregular heartbeat (palpitations).  Tingling  or numbness. DIAGNOSIS  Your health care provider can diagnose hypokalemia with blood tests. In addition to checking your potassium level, your health care provider may also check other lab tests. TREATMENT Hypokalemia can be  treated with potassium supplements taken by mouth or adjustments in your current medicines. If your potassium level is very low, you may need to get potassium through a vein (IV) and be monitored in the hospital. A diet high in potassium is also helpful. Foods high in potassium are:  Nuts, such as peanuts and pistachios.  Seeds, such as sunflower seeds and pumpkin seeds.  Peas, lentils, and lima beans.  Whole grain and bran cereals and breads.  Fresh fruit and vegetables, such as apricots, avocado, bananas, cantaloupe, kiwi, oranges, tomatoes, asparagus, and potatoes.  Orange and tomato juices.  Red meats.  Fruit yogurt. HOME CARE INSTRUCTIONS  Take all medicines as prescribed by your health care provider.  Maintain a healthy diet by including nutritious food, such as fruits, vegetables, nuts, whole grains, and lean meats.  If you are taking a laxative, be sure to follow the directions on the label. SEEK MEDICAL CARE IF:  Your weakness gets worse.  You feel your heart pounding or racing.  You are vomiting or having diarrhea.  You are diabetic and having trouble keeping your blood glucose in the normal range. SEEK IMMEDIATE MEDICAL CARE IF:  You have chest pain, shortness of breath, or dizziness.  You are vomiting or having diarrhea for more than 2 days.  You faint. MAKE SURE YOU:   Understand these instructions.  Will watch your condition.  Will get help right away if you are not doing well or get worse.   This information is not intended to replace advice given to you by your health care provider. Make sure you discuss any questions you have with your health care provider.   Document Released: 03/14/2005 Document Revised: 04/04/2014 Document Reviewed: 09/14/2012 Elsevier Interactive Patient Education [employer].

## 2015-07-07 DIAGNOSIS — H903 Sensorineural hearing loss, bilateral: Secondary | ICD-10-CM | POA: Insufficient documentation

## 2015-07-07 DIAGNOSIS — H9313 Tinnitus, bilateral: Secondary | ICD-10-CM | POA: Insufficient documentation

## 2015-07-31 ENCOUNTER — Encounter: Payer: Self-pay | Admitting: Gynecology

## 2015-07-31 ENCOUNTER — Ambulatory Visit (INDEPENDENT_AMBULATORY_CARE_PROVIDER_SITE_OTHER): Payer: Managed Care, Other (non HMO) | Admitting: Gynecology

## 2015-07-31 VITALS — BP 133/86 | Ht 62.0 in | Wt 163.0 lb

## 2015-07-31 DIAGNOSIS — Z780 Asymptomatic menopausal state: Secondary | ICD-10-CM | POA: Diagnosis not present

## 2015-07-31 DIAGNOSIS — Z8639 Personal history of other endocrine, nutritional and metabolic disease: Secondary | ICD-10-CM

## 2015-07-31 DIAGNOSIS — R896 Abnormal cytological findings in specimens from other organs, systems and tissues: Secondary | ICD-10-CM

## 2015-07-31 DIAGNOSIS — Z01419 Encounter for gynecological examination (general) (routine) without abnormal findings: Secondary | ICD-10-CM

## 2015-07-31 DIAGNOSIS — IMO0002 Reserved for concepts with insufficient information to code with codable children: Secondary | ICD-10-CM

## 2015-07-31 NOTE — Progress Notes (Signed)
Debra Simon February 19, 1951 [id number]   History:    65 y.o.  for annual gyn exam with no complaints today. Patient this year had her blood work drawn by her PCP in [place] all her labs were normal. Review of her record indicated the following:  In 1994 patient had an attempted laparoscopic hysterectomy with laser in [place] as a result of symptomatic leiomyomatous uteri and complications in which bilateral ureteral injuries had been reported. 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 He had done a cystoscopy right retrograde pyelogram and also had attempted ureteral stents back in 1994. He did have proceeded with exploration of the ureters and performed a right ureterotomy and repair laceration of left ureter with a ureteral neo-cystotomy. Patient has had no further problems since that time she does have frequency and occasional nocturia because of the hypertensive medications she is currently on which is Diovan 80 mg daily. She states that she has been seen a PCP in [place] but has now moved back to [place] and will need to be followed here locally. Prior to her hysterectomy in 2009 she had atypical squamous cells of undetermined significance with high-risk HPV but the followup after that we did here in our office in 2011 was normal. Patient also states that she has had in the past fine needle aspiration of bilateral breast cysts. She also stated many years ago she had a right breast biopsy which she brought copy of the pathology report today that demonstrated that she had intraductal hyperplasia with mild epithelial atypia. Also she has had a history of colon polyps which were detected in the past but she has informed me that she had one in [place] recently in 2004 which was benign. She had a colonoscopy in 2016 and she has had hemorrhoid banding 2. She had a normal bone density here in 2016. Her PCP  Dr. Danise Mina had been treating her for vitamin D deficiency and hypertension.  She was seen in April of last year as a result of her abnormal Pap smear which had demonstrated the following: Diagnosis NEGATIVE FOR INTRAEPITHELIAL LESIONS OR MALIGNANCY. Debra Simon Cytotechnologist Electronic Signature (Case signed 06/02/2014) Source Vaginal Pap [ThinPrep Imaged]  Ancillary Testing HPV 16,18/45 Genotyping NEGATIVE for HPV 16 & 18/45 Completed by PT2 on 2014-06-05 HPV High Risk **DETECTED**  Patient brought copies of previous Pap smears which will be scanned into her record but in summary as follows: Pap smear 2009 negative but positive HPV Pap smear January 2010 [facility] positive HPV Pap smear October 2010 [facility] with positive HPV  Patient underwent a detail colposcopic evaluation. The external genitalia, perineum, perirectal region were inspected with no lesions seen. The speculum was introduced in the vagina. The entire vagina was inspected and no lesions were noted or in the vaginal cuff even after applying acetic acid.  Patient had a normal bone density study in 2016  Past medical history,surgical history, family history and social history were all reviewed and documented in the EPIC chart.  Gynecologic History No LMP recorded. Patient has had a hysterectomy. Contraception: post menopausal status Last Pap: See above. Results were: See above Last mammogram: 2016. Results were: Normal but dense had three-dimensional mammogram  Obstetric History OB History  Gravida Para Term Preterm AB SAB TAB Ectopic Multiple Living  7 4   3 3    4     # Outcome Date GA  Lbr Len/2nd Weight Sex Delivery Anes PTL Lv  7 SAB           6 SAB           5 SAB           4 Para           3 Para           2 Para           1 Para                ROS: A ROS was performed and pertinent positives and negatives are included in the history.  GENERAL: No fevers or chills. HEENT: No change in vision, no  earache, sore throat or sinus congestion. NECK: No pain or stiffness. CARDIOVASCULAR: No chest pain or pressure. No palpitations. PULMONARY: No shortness of breath, cough or wheeze. GASTROINTESTINAL: No abdominal pain, nausea, vomiting or diarrhea, melena or bright red blood per rectum. GENITOURINARY: No urinary frequency, urgency, hesitancy or dysuria. MUSCULOSKELETAL: No joint or muscle pain, no back pain, no recent trauma. DERMATOLOGIC: No rash, no itching, no lesions. ENDOCRINE: No polyuria, polydipsia, no heat or cold intolerance. No recent change in weight. HEMATOLOGICAL: No anemia or easy bruising or bleeding. NEUROLOGIC: No headache, seizures, numbness, tingling or weakness. PSYCHIATRIC: No depression, no loss of interest in normal activity or change in sleep pattern.     Exam: chaperone present  BP 133/86 mmHg  Ht 5\' 2"  (1.575 m)  Wt 163 lb (73.936 kg)  BMI 29.81 kg/m2  Body mass index is 29.81 kg/(m^2).  General appearance : Well developed well nourished female. No acute distress HEENT: Eyes: no retinal hemorrhage or exudates,  Neck supple, trachea midline, no carotid bruits, no thyroidmegaly Lungs: Clear to auscultation, no rhonchi or wheezes, or rib retractions  Heart: Regular rate and rhythm, no murmurs or gallops Breast:Examined in sitting and supine position were symmetrical in appearance, no palpable masses or tenderness,  no skin retraction, no nipple inversion, no nipple discharge, no skin discoloration, no axillary or supraclavicular lymphadenopathy Abdomen: no palpable masses or tenderness, no rebound or guarding Extremities: no edema or skin discoloration or tenderness  Pelvic:  B[place]olin, Urethra, Skene Glands: Within normal limits             Vagina: No gross lesions or discharge, atrophic changes  Cervix: Absent  Uterus absent  Adnexa  Without masses or tenderness  Anus and perineum  normal   Rectovaginal  normal sphincter tone without palpated masses or  tenderness             Hemoccult PCP will provide     Assessment/Plan:  65 y.o. female for annual exam postmenopausal no hormone replacement therapy abnormal Pap smear last year with negative colposcopy Pap smear with HPV screening done today. Patient with past history vitamin D deficiency we will check a vitamin D level today. Patient reminded to schedule her mammogram this year and to request a three-dimensional mammogram as a result of her dense breasts. She was reminded importance of monthly breast exam. She will not need a bone density study until next year. Her PCP has been doing the rest of her blood work.   Terrance Mass MD, 11:05 AM 07/31/2015

## 2015-08-04 LAB — PAP, TP IMAGING W/ HPV RNA, RFLX HPV TYPE 16,18/45: HPV MRNA, HIGH RISK: DETECTED — AB

## 2015-08-07 LAB — HPV TYPE 16 AND 18/45 RNA
HPV TYPE 18/45 RNA: NOT DETECTED
HPV Type 16 RNA: NOT DETECTED

## 2015-08-20 ENCOUNTER — Ambulatory Visit: Payer: Managed Care, Other (non HMO) | Admitting: Gynecology

## 2015-08-28 ENCOUNTER — Encounter: Payer: Self-pay | Admitting: Gynecology

## 2015-08-28 ENCOUNTER — Ambulatory Visit (INDEPENDENT_AMBULATORY_CARE_PROVIDER_SITE_OTHER): Payer: Managed Care, Other (non HMO) | Admitting: Gynecology

## 2015-08-28 VITALS — BP 128/80 | Ht 62.0 in | Wt 163.0 lb

## 2015-08-28 DIAGNOSIS — N893 Dysplasia of vagina, unspecified: Secondary | ICD-10-CM | POA: Insufficient documentation

## 2015-08-28 NOTE — Patient Instructions (Signed)
Colposcopa - Cuidados posteriores (Colposcopy, Care After) Siga estas instrucciones durante las prximas semanas. Estas indicaciones le proporcionan informacin general acerca de cmo deber cuidarse despus del procedimiento. El mdico tambin podr darle instrucciones ms especficas. El tratamiento se ha planificado de acuerdo a las prcticas mdicas actuales, pero a veces se producen problemas. Comunquese con el mdico si tiene algn problema o tiene dudas despus del procedimiento. QU ESPERAR DESPUS DEL PROCEDIMIENTO  Despus del procedimiento, es tpico tener las siguientes sensaciones:  Clicos. Generalmente se calman en algunos minutos.  Dolor. [place].  Aturdimiento. Si esto le ocurre, recustese durante algunos minutos. Podr tener un sangrado leve o una secrecin oscura que debe detenerse en [place]. Durante este tiempo deber usar un apsito sanitario. [place] vaginales y el uso de tampones durante 3 das, o segn lo que le indique su mdico.  Tome slo medicamentos de venta libre o recetados, segn las indicaciones del mdico. No tome aspirina, ya que puede causar hemorragias.  Si utiliza pldoras anticonceptivas, contine tomndolas.  No todos los resultados estarn disponibles durante su visita. En este caso, tenga otra entrevista con su mdico para conocerlos. No suponga que es normal si no tiene noticias de su mdico o del establecimiento de salud. Es Building services engineer seguimiento de todos los [place].  Siga los consejos de su mdico con respecto a los [place], [place], visitas y [place] de control. SOLICITE ATENCIN MDICA SI:  Aparece una erupcin cutnea.  Tiene problemas con los medicamentos. SOLICITE ATENCIN MDICA DE INMEDIATO SI:  Tiene una hemorragia abundante o elimina cogulos.  Tiene fiebre.  Tiene flujo vaginal  anormal.  Tiene clicos que no se alivian luego de tomar analgsicos.  Se siente mareada, tiene vahdos o se desmaya.  Siente Research scientist (life sciences).   Esta informacin no tiene Marine scientist el consejo del mdico. Asegrese de hacerle al mdico cualquier pregunta que tenga.   Document Released: 01/02/2013 Elsevier Interactive Patient Education [employer].

## 2015-08-28 NOTE — Addendum Note (Signed)
Addended by: Burnett Kanaris on: 08/28/2015 04:22 PM   Modules accepted: Orders

## 2015-08-28 NOTE — Progress Notes (Signed)
   Patient is a 65 year old who was seen in the office for her annual exam May 5 of this year. She is here because her her recent Pap smear in the office demonstrated the following:  Low-grade squamous intraepithelial lesion encompassing HPV/mild dysplasia High-risk HPV detected  Patient had brought copies at last office visit about her Pap smears of which sound were done in the past in ER: April 2016 her Pap smear was as follows: NEGATIVE FOR INTRAEPITHELIAL LESIONS OR MALIGNANCY. [name] Cytotechnologist Electronic Signature (Case signed 06/02/2014) Source Vaginal Pap [ThinPrep Imaged]  Ancillary Testing HPV 16,18/45 Genotyping NEGATIVE for HPV 16 & 18/45 Completed by PT2 on 2014-06-05 HPV High Risk **DETECTED**  Patient brought copies of previous Pap smears which will be scanned into her record but in summary as follows: Pap smear 2009 negative but positive HPV Pap smear January 2010 ASCUS positive HPV Pap smear October 2010 ASCUS with positive HPV  Patient underwent a detail colposcopic evaluation. The external genitalia, perineum, perirectal region were inspected with no lesions seen. The speculum was introduced in the vagina. The entire vagina was inspected and no lesions were noted or in the vaginal cuff even after applying acetic acid.  Her past surgical history from [place] is as follows: In 1994 patient had an attempted laparoscopic hysterectomy with laser in [place] as a result of symptomatic leiomyomatous uteri and complications in which bilateral ureteral injuries had been reported. 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 He had done a cystoscopy right retrograde pyelogram and also had attempted ureteral stents back in 1994. He did have proceeded with exploration of the ureters and performed a right ureterotomy and repair laceration of left ureter with a ureteral  neo-cystotomy.   Patient underwent a detail colposcopic evaluation as follows: The external genitalia, perineum and perirectal region were inspected no lesions were seen. The speculum was introduced into the vagina. A frond-like tissue hyperemic was noted at the right vaginal sidewall approximately 1 cm from the vaginal cuff. The vaginal cuff near the right side there was speckled leukoplakic areas. Both of these areas were respectively biopsy and submitted for histological evaluation. Silver nitrate and Monsel solution was used for hemostasis.  Assessment/plan: Patient with prior history of laparoscopic hysterectomy [place] back in 1994 with history of abnormal Paps was as described above and recent findings. Colposcopic directed biopsies obtained today pathology report pending. We will notify the patient with the results and manage accordingly.

## 2015-12-31 ENCOUNTER — Other Ambulatory Visit: Payer: Self-pay | Admitting: Gynecology

## 2015-12-31 DIAGNOSIS — Z1231 Encounter for screening mammogram for malignant neoplasm of breast: Secondary | ICD-10-CM

## 2016-01-19 ENCOUNTER — Ambulatory Visit
Admission: RE | Admit: 2016-01-19 | Discharge: 2016-01-19 | Disposition: A | Discharge status: Home / Self Care | Payer: Managed Care, Other (non HMO) | Source: Ambulatory Visit | Attending: Gynecology | Admitting: Gynecology

## 2016-01-19 DIAGNOSIS — Z1231 Encounter for screening mammogram for malignant neoplasm of breast: Secondary | ICD-10-CM

## 2016-03-03 ENCOUNTER — Encounter: Payer: Self-pay | Admitting: Gynecology

## 2016-03-03 ENCOUNTER — Ambulatory Visit (INDEPENDENT_AMBULATORY_CARE_PROVIDER_SITE_OTHER): Payer: Managed Care, Other (non HMO) | Admitting: Gynecology

## 2016-03-03 VITALS — BP 128/82

## 2016-03-03 DIAGNOSIS — N893 Dysplasia of vagina, unspecified: Secondary | ICD-10-CM

## 2016-03-03 DIAGNOSIS — N952 Postmenopausal atrophic vaginitis: Secondary | ICD-10-CM | POA: Diagnosis not present

## 2016-03-03 DIAGNOSIS — Z1272 Encounter for screening for malignant neoplasm of vagina: Secondary | ICD-10-CM

## 2016-03-03 MED ORDER — ESTRADIOL 0.1 MG/GM VA CREA: 1.0000 g | TOPICAL_CREAM | VAGINAL | 8 refills | Status: DC

## 2016-03-03 NOTE — Progress Notes (Signed)
Patient is a 65 year old who presented to the office today for her 6 month follow-up colposcopic evaluation as a result of her past history of vaginal dysplasia and abnormal Pap smears as follows:  Patient is annual exam was in May of this year demonstrated the following Pap smear: Low-grade squamous intraepithelial lesion encompassing HPV/mild dysplasia High-risk HPV detected  Patient had brought copies at last office visit about her Pap smears of which sound were done in the past in ER: April 2016 her Pap smear was as follows: NEGATIVE FOR INTRAEPITHELIAL LESIONS OR MALIGNANCY. [name] Cytotechnologist Electronic Signature (Case signed 06/02/2014) Source Vaginal Pap [ThinPrep Imaged]  Ancillary Testing HPV 16,18/45 Genotyping NEGATIVE for HPV 16 & 18/45 Completed by PT2 on 2014-06-05 HPV High Risk **DETECTED**  Patient brought copies of previous Pap smears which will be scanned into her record but in summary as follows: Pap smear 2009 negative but positive HPV Pap smear January 2010 ASCUS positive HPV Pap smear October 2010 ASCUS with positive HPV  Patient underwent a detail colposcopic evaluation on 08/28/2015 through the following was noted: "The external genitalia, perineum and perirectal region were inspected no lesions were seen. The speculum was introduced into the vagina. A frond-like tissue hyperemic was noted at the right vaginal sidewall approximately 1 cm from the vaginal cuff. The vaginal cuff near the right side there was speckled leukoplakic areas. Both of these areas were respectively biopsy and submitted for histological evaluation. Silver nitrate and Monsel solution was used for hemostasis."  Pathology report demonstrated the following: Diagnosis 1. Vagina, biopsy, vaginal cuff - SQUAMOUS MUCOSA WITH MARKED CHRONIC INFLAMMATION AND REACTIVE EPITHELIAL CHANGES. - NO DYSPLASIA OR MALIGNANCY IDENTIFIED. - SEE COMMENT. 2. Vagina, biopsy, vaginal right  side wall - SQUAMOUS MUCOSA WITH MILD CHRONIC INFLAMMATION AND REACTIVE EPITHELIAL CHANGES. - NO DYSPLASIA OR MALIGNANCY IDENTIFIED. - SEE COMMENT. Microscopic Comment 1. -2. The diagnoses are supported with negative P16 immunostains. (MEG 09/03/15)  Her past surgical history from [place] is as follows: In 1994 patient had an attempted laparoscopic hysterectomy with laser in [place] as a result of symptomatic leiomyomatous uteri and complications in which bilateral ureteral injuries had been reported. 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 He had done a cystoscopy right retrograde pyelogram and also had attempted ureteral stents back in 1994. He did have proceeded with exploration of the ureters and performed a right ureterotomy and repair laceration of left ureter with a ureteral neo-cystotomy.    Patient underwent a detail colposcopic evaluation today of the external genitalia, perineum, perirectal region no lesions were noted. The speculum was introduced into the vagina once again the submucosal tissue on the right present was biopsied appears to be the same there was no leukoplakic areas. The vaginal cuff was very friable from atrophy but slightly above the vaginal cuff at the 12:00 position there were 2 small isolated leukoplakic areas which were respectively biopsied and Monsel solution was used for hemostasis.  Assessment/plan:Patient with prior history of laparoscopic hysterectomy [place] back in 1994 with history of abnormal Paps was as described above and recent findings. Colposcopic directed biopsies obtained today pathology report pending. We will notify the patient with the results and manage accordingly. Because her vaginal atrophy she will be placed on Estrace 1 g intravaginally twice a week and have her return back in 6 months for follow-up colposcopy and Pap smear. Of note Pap smear was  done today as well. Risk benefits and pros and cons of vaginal estrogen were discussed and literature information was provided in [place].

## 2016-03-03 NOTE — Patient Instructions (Signed)
Colposcopía - Cuidados posteriores  (Colposcopy, Care After)  Siga estas instrucciones durante las próximas semanas. Estas indicaciones le proporcionan información general acerca de cómo deberá cuidarse después del procedimiento. El médico también podrá darle instrucciones más específicas. El tratamiento se ha planificado de acuerdo a las prácticas médicas actuales, pero a veces se producen problemas. Comuníquese con el médico si tiene algún problema o tiene dudas después del procedimiento.  QUÉ ESPERAR DESPUÉS DEL PROCEDIMIENTO   Después del procedimiento, es típico tener las siguientes sensaciones:  · Cólicos. Generalmente se calman en algunos minutos.  · Dolor. Puede durar hasta dos días.  · Aturdimiento. Si esto le ocurre, recuéstese durante algunos minutos.  Podrá tener un sangrado leve o una secreción oscura que debe detenerse en algunos días. Durante este tiempo deberá usar un apósito sanitario.  INSTRUCCIONES PARA EL CUIDADO EN EL HOGAR  · Evite las relaciones sexuales, las duchas vaginales y el uso de tampones durante 3 días, o según lo que le indique su médico.  · Tome sólo medicamentos de venta libre o recetados, según las indicaciones del médico. No tome aspirina, ya que puede causar hemorragias.  · Si utiliza píldoras anticonceptivas, continúe tomándolas.  · No todos los resultados estarán disponibles durante su visita. En este caso, tenga otra entrevista con su médico para conocerlos. No suponga que es normal si no tiene noticias de su médico o del establecimiento de salud. Es importante el seguimiento de todos los resultados de los estudios.  · Siga los consejos de su médico con respecto a los medicamentos, actividades, visitas y [place] de control.  SOLICITE ATENCIÓN MÉDICA SI:  · Aparece una erupción cutánea.  · Tiene problemas con los medicamentos.  SOLICITE ATENCIÓN MÉDICA DE INMEDIATO SI:  · Tiene una hemorragia abundante o elimina coágulos.  · Tiene fiebre.  · Tiene flujo vaginal  anormal.  · Tiene cólicos que no se alivian luego de tomar analgésicos.  · Se siente mareada, tiene vahídos o se desmaya.  · Siente dolor en el estómago.  Esta información no tiene como fin reemplazar el consejo del médico. Asegúrese de hacerle al médico cualquier pregunta que tenga.  Document Released: 01/02/2013  Elsevier Interactive Patient Education © 2017 Elsevier Inc.

## 2016-03-07 LAB — PAP IG W/ RFLX HPV ASCU

## 2016-03-07 LAB — PATHOLOGY

## 2016-03-08 LAB — HUMAN PAPILLOMAVIRUS, HIGH RISK: HPV DNA HIGH RISK: DETECTED — AB

## 2016-05-31 ENCOUNTER — Telehealth: Payer: Self-pay | Admitting: *Deleted

## 2016-05-31 NOTE — Telephone Encounter (Signed)
Thank you :)

## 2016-05-31 NOTE — Telephone Encounter (Signed)
Called pt using [facility] [phone]. Adrianna left a voicemail confirming appt and left call back number. An in person interpreter is scheduled to attend tomorrows visit.

## 2016-06-01 ENCOUNTER — Encounter: Payer: Self-pay | Admitting: Family Medicine

## 2016-06-01 ENCOUNTER — Ambulatory Visit (INDEPENDENT_AMBULATORY_CARE_PROVIDER_SITE_OTHER): Payer: Managed Care, Other (non HMO) | Admitting: Family Medicine

## 2016-06-01 VITALS — BP 172/96 | HR 70 | Temp 97.8°F | Wt 172.0 lb

## 2016-06-01 DIAGNOSIS — I10 Essential (primary) hypertension: Secondary | ICD-10-CM

## 2016-06-01 DIAGNOSIS — I6381 Other cerebral infarction due to occlusion or stenosis of small artery: Secondary | ICD-10-CM

## 2016-06-01 DIAGNOSIS — E785 Hyperlipidemia, unspecified: Secondary | ICD-10-CM | POA: Diagnosis not present

## 2016-06-01 DIAGNOSIS — R011 Cardiac murmur, unspecified: Secondary | ICD-10-CM

## 2016-06-01 DIAGNOSIS — R42 Dizziness and giddiness: Secondary | ICD-10-CM

## 2016-06-01 DIAGNOSIS — H8110 Benign paroxysmal vertigo, unspecified ear: Secondary | ICD-10-CM | POA: Diagnosis not present

## 2016-06-01 DIAGNOSIS — E786 Lipoprotein deficiency: Secondary | ICD-10-CM

## 2016-06-01 DIAGNOSIS — Z6831 Body mass index (BMI) 31.0-31.9, adult: Secondary | ICD-10-CM

## 2016-06-01 DIAGNOSIS — E6609 Other obesity due to excess calories: Secondary | ICD-10-CM

## 2016-06-01 DIAGNOSIS — I639 Cerebral infarction, unspecified: Secondary | ICD-10-CM

## 2016-06-01 LAB — CBC WITH DIFFERENTIAL/PLATELET
Basophils Absolute: 0 cells/uL (ref 0–200)
Basophils Relative: 0 %
Eosinophils Absolute: 108 cells/uL (ref 15–500)
Eosinophils Relative: 2 %
HCT: 41.6 % (ref 35.0–45.0)
Hemoglobin: 13.9 g/dL (ref 11.7–15.5)
Lymphocytes Relative: 27 %
Lymphs Abs: 1458 cells/uL (ref 850–3900)
MCH: 29.6 pg (ref 27.0–33.0)
MCHC: 33.4 g/dL (ref 32.0–36.0)
MCV: 88.7 fL (ref 80.0–100.0)
MPV: 10.4 fL (ref 7.5–12.5)
Monocytes Absolute: 378 cells/uL (ref 200–950)
Monocytes Relative: 7 %
Neutro Abs: 3456 cells/uL (ref 1500–7800)
Neutrophils Relative %: 64 %
Platelets: 230 10*3/uL (ref 140–400)
RBC: 4.69 MIL/uL (ref 3.80–5.10)
RDW: 13.8 % (ref 11.0–15.0)
WBC: 5.4 10*3/uL (ref 3.8–10.8)

## 2016-06-01 LAB — COMPREHENSIVE METABOLIC PANEL
ALT: 15 U/L (ref 6–29)
AST: 17 U/L (ref 10–35)
Albumin: 4.4 g/dL (ref 3.6–5.1)
Alkaline Phosphatase: 70 U/L (ref 33–130)
BUN: 14 mg/dL (ref 7–25)
CO2: 30 mmol/L (ref 20–31)
Calcium: 9.4 mg/dL (ref 8.6–10.4)
Chloride: 103 mmol/L (ref 98–110)
Creat: 0.57 mg/dL (ref 0.50–0.99)
Glucose, Bld: 106 mg/dL — ABNORMAL HIGH (ref 65–99)
Potassium: 3.7 mmol/L (ref 3.5–5.3)
Sodium: 141 mmol/L (ref 135–146)
Total Bilirubin: 0.6 mg/dL (ref 0.2–1.2)
Total Protein: 7.6 g/dL (ref 6.1–8.1)

## 2016-06-01 LAB — MAGNESIUM: Magnesium: 2.1 mg/dL (ref 1.5–2.5)

## 2016-06-01 LAB — LIPID PANEL
Cholesterol: 172 mg/dL (ref <200)
HDL: 43 mg/dL — ABNORMAL LOW (ref >50)
LDL Cholesterol: 113 mg/dL — ABNORMAL HIGH (ref <100)
Total CHOL/HDL Ratio: 4 Ratio (ref <5.0)
Triglycerides: 80 mg/dL (ref <150)
VLDL: 16 mg/dL (ref <30)

## 2016-06-01 LAB — TSH: TSH: 2.64 mIU/L

## 2016-06-01 MED ORDER — MECLIZINE HCL 25 MG PO TABS: 25.0000 mg | ORAL_TABLET | Freq: Three times a day (TID) | ORAL | 0 refills | Status: DC | PRN

## 2016-06-01 NOTE — Progress Notes (Signed)
Debra Simon is a 66 y.o. female is here to [place].   History of Present Illness:   1. Essential hypertension. Cardiovascular ROS: negative for - chest pain, edema, palpitations or shortness of breath. Taking medications as prescribed without side effects.   Wt Readings from Last 3 Encounters:  06/01/16 172 lb (78 kg)  08/28/15 163 lb (73.9 kg)  07/31/15 163 lb (73.9 kg)    reports that she has never smoked. She has never used smokeless tobacco. BP Readings from Last 3 Encounters:  06/01/16 (!) 172/96  03/03/16 128/82  08/28/15 128/80   Lab Results  Component Value Date   CREATININE 0.57 06/01/2016    2. Dizziness. Hx of the same - present for a few days. The patient describes the symptoms as disequalibirum. Symptoms are exacerbated by looking  downward, to the right, to the left and bending The patient also complains of tinnitus and hearing loss.  She has been treated with meclizine (Antivert) with good improvement in the past.  Previous work up has been MRI, ENT evaluation.    3. Heart murmur. Denies a Hx of the same, but endorses having a normal ECHO many years ago in [place].   Health Maintenance Due  Topic Date Due  . Hepatitis C Screening  May 26, 1950  . HIV Screening  11/11/1966  . DTaP/Tdap/Td (1 - Tdap) 11/11/1970  . TETANUS/TDAP  11/11/1970  . INFLUENZA VACCINE  10/27/2015   PMHx, SurgHx, SocialHx, Medications, and Allergies were reviewed in the Visit Navigator and updated as appropriate.   Past Medical History:  Diagnosis Date  . Arthritis   . Colon polyps 2010  . Hypertension   . Ureterovaginal fistula   . Uterine fibroid 1994   Social History  Substance Use Topics  . Smoking status: Never Smoker  . Smokeless tobacco: Never Used  . Alcohol use No   Current Medications and Allergies:   .  CALCIUM PO, Take 1 tablet by mouth daily., Disp: , Rfl:  .  cholecalciferol (VITAMIN D) 1000 UNITS tablet, Take 1,000 Units by mouth daily., Disp: , Rfl:    .  estradiol (ESTRACE) 0.1 MG/GM vaginal cream, Place 1 g vaginally 2 (two) times a week., Disp: 42.5 g, Rfl: 8 .  valsartan-hydrochlorothiazide (DIOVAN-HCT) 80-12.5 MG per tablet, Take one tablet daily **MUST HAVE PHYSICAL FOR FURTHER REFILLS**, Disp: 30 tablet, Rfl: 0 .  Multiple Vitamin (MULTI-VITAMINS) TABS, Take by mouth., Disp: , Rfl:   No Known Allergies   Review of Systems:   Review of Systems: The patient denies chills, fever, weight loss/gain, fatigue, lack of appetite, difficulty swallowing, sore throat, earache, post-nasal drip, chest pain, swelling of legs, cough, wheezing, change in bowel habits, nausea, vomiting, changes in urination,  skin changes, headaches, numbness, anxiety, depression, memory changes, swollen glands, easy bruising.    Vitals:   Vitals:   06/01/16 0917 06/01/16 0925  BP: (!) 154/92 (!) 172/96  Pulse: 71 70  Temp: 97.8 F (36.6 C)   TempSrc: Oral   SpO2: 99%   Weight: 172 lb (78 kg)      Body mass index is 31.46 kg/m.   Physical Exam:   General: Alert, cooperative, appears stated age and no distress.  HEENT:  Normocephalic, without obvious abnormality, atraumatic. Conjunctivae/corneas clear. PERRL, EOM's intact. Normal TM's and external ear canals both ears. Nares normal. Septum midline. Mucosa normal. No drainage or sinus tenderness. Lips, mucosa, and tongue normal; teeth and gums normal.  Lungs: Clear to auscultation bilaterally.  Heart:: Regular rate and rhythm. 3/6 systolic murmur RUSB.  Abdomen: Soft, non-tender; bowel sounds normal; no masses,  no organomegaly.  Extremities: Extremities normal, atraumatic, no cyanosis or edema.  Pulses: 2+ and symmetric.  Skin: Skin color, texture, turgor normal. No rashes or lesions.  Neurologic: Alert and oriented X 3, normal strength and tone. Normal symmetric. reflexes. Normal coordination and gait. CN intact.  Psych: Alert,oriented, in NAD with a full range of affect, normal behavior and no  psychotic features   Results for orders placed or performed in visit on 06/01/16  CBC with Differential/Platelet  Result Value Ref Range   WBC 5.4 3.8 - 10.8 K/uL   RBC 4.69 3.80 - 5.10 MIL/uL   Hemoglobin 13.9 11.7 - 15.5 g/dL   HCT 41.6 35.0 - 45.0 %   MCV 88.7 80.0 - 100.0 fL   MCH 29.6 27.0 - 33.0 pg   MCHC 33.4 32.0 - 36.0 g/dL   RDW 13.8 11.0 - 15.0 %   Platelets 230 140 - 400 K/uL   MPV 10.4 7.5 - 12.5 fL   Neutro Abs 3,456 1,500 - 7,800 cells/uL   Lymphs Abs 1,458 850 - 3,900 cells/uL   Monocytes Absolute 378 200 - 950 cells/uL   Eosinophils Absolute 108 15 - 500 cells/uL   Basophils Absolute 0 0 - 200 cells/uL   Neutrophils Relative % 64 %   Lymphocytes Relative 27 %   Monocytes Relative 7 %   Eosinophils Relative 2 %   Basophils Relative 0 %   Smear Review Criteria for review not met   Comprehensive metabolic panel  Result Value Ref Range   Sodium 141 135 - 146 mmol/L   Potassium 3.7 3.5 - 5.3 mmol/L   Chloride 103 98 - 110 mmol/L   CO2 30 20 - 31 mmol/L   Glucose, Bld 106 (H) 65 - 99 mg/dL   BUN 14 7 - 25 mg/dL   Creat 0.57 0.50 - 0.99 mg/dL   Total Bilirubin 0.6 0.2 - 1.2 mg/dL   Alkaline Phosphatase 70 33 - 130 U/L   AST 17 10 - 35 U/L   ALT 15 6 - 29 U/L   Total Protein 7.6 6.1 - 8.1 g/dL   Albumin 4.4 3.6 - 5.1 g/dL   Calcium 9.4 8.6 - 10.4 mg/dL  Lipid panel  Result Value Ref Range   Cholesterol 172 <200 mg/dL   Triglycerides 80 <150 mg/dL   HDL 43 (L) >50 mg/dL   Total CHOL/HDL Ratio 4.0 <5.0 Ratio   VLDL 16 <30 mg/dL   LDL Cholesterol 113 (H) <100 mg/dL  Magnesium  Result Value Ref Range   Magnesium 2.1 1.5 - 2.5 mg/dL  TSH  Result Value Ref Range   TSH 2.64 mIU/L    Assessment and Plan:   [place] was seen today for dizziness.  Diagnoses and all orders for this visit:  Essential hypertension Comments: Elevated today, but without red flags. Patient will keep log of BP this week. If continues > 150/90, will call for appointment in one  week. Orders: -     EKG 12-Lead - NSR, without ST changes -     ECHOCARDIOGRAM COMPLETE; Future -     CBC with Differential/Platelet -     Comprehensive metabolic panel -     aspirin EC 81 MG tablet; Take 1 tablet (81 mg total) by mouth daily.  Benign paroxysmal vertigo, unspecified laterality Comments: Symptoms c/w vertigo. ENT records reviewed. Orders: -  meclizine (ANTIVERT) 25 MG tablet; Take 1 tablet (25 mg total) by mouth 3 (three) times daily as needed for dizziness.  Heart murmur Comments: New. Echo. Orders: -     EKG 12-Lead -     ECHOCARDIOGRAM COMPLETE; Future -     Magnesium  Class 1 obesity due to excess calories without serious comorbidity with body mass index (BMI) of 31.0 to 31.9 in adult -     Lipid panel -     TSH  Dizziness -     ECHOCARDIOGRAM COMPLETE; Future -     CBC with Differential/Platelet  Hyperlipidemia with low HDL Comments: ASCVD 9.9%. Will add statin.  Orders: -     simvastatin (ZOCOR) 20 MG tablet; Take 1 tablet (20 mg total) by mouth every evening.  Lacunar infarction ([place]) Comments: Hx of. Found on MRI 06/23/15 when working up dizziness. Looked to be old at that time. Will add ASA.   Briscoe Deutscher, [place], [place] 06/01/2016   Follow-up: Return in about 3 months (around 09/01/2016).  Meds ordered this encounter  Medications  . Multiple Vitamin (MULTI-VITAMINS) TABS    Sig: Take by mouth.  . meclizine (ANTIVERT) 25 MG tablet    Sig: Take 1 tablet (25 mg total) by mouth 3 (three) times daily as needed for dizziness.    Dispense:  30 tablet    Refill:  0   There are no discontinued medications. Orders Placed This Encounter  Procedures  . CBC with Differential/Platelet  . Comprehensive metabolic panel  . Lipid panel  . Magnesium  . TSH  . EKG 12-Lead  . ECHOCARDIOGRAM COMPLETE

## 2016-06-01 NOTE — Progress Notes (Signed)
Pre visit review using our clinic review tool, if applicable. No additional management support is needed unless otherwise documented below in the visit note. 

## 2016-06-02 DIAGNOSIS — H8110 Benign paroxysmal vertigo, unspecified ear: Secondary | ICD-10-CM | POA: Insufficient documentation

## 2016-06-02 DIAGNOSIS — E782 Mixed hyperlipidemia: Secondary | ICD-10-CM | POA: Insufficient documentation

## 2016-06-02 DIAGNOSIS — E785 Hyperlipidemia, unspecified: Secondary | ICD-10-CM | POA: Insufficient documentation

## 2016-06-02 DIAGNOSIS — E786 Lipoprotein deficiency: Secondary | ICD-10-CM

## 2016-06-02 DIAGNOSIS — I6381 Other cerebral infarction due to occlusion or stenosis of small artery: Secondary | ICD-10-CM | POA: Insufficient documentation

## 2016-06-02 MED ORDER — ASPIRIN EC 81 MG PO TBEC: 81.0000 mg | DELAYED_RELEASE_TABLET | Freq: Every day | ORAL | 3 refills | Status: DC

## 2016-06-02 MED ORDER — SIMVASTATIN 20 MG PO TABS: 20.0000 mg | ORAL_TABLET | Freq: Every evening | ORAL | 3 refills | Status: DC

## 2016-06-02 NOTE — Progress Notes (Signed)
ASCVD  9.9%.  - MODERATE TO HIGH INTENSITY STATIN REC - BP < 140/90

## 2016-06-03 ENCOUNTER — Encounter: Payer: Self-pay | Admitting: Emergency Medicine

## 2016-06-13 ENCOUNTER — Ambulatory Visit: Payer: Managed Care, Other (non HMO)

## 2016-06-22 ENCOUNTER — Ambulatory Visit
Admission: RE | Admit: 2016-06-22 | Discharge: 2016-06-22 | Disposition: A | Discharge status: Home / Self Care | Payer: Managed Care, Other (non HMO) | Source: Ambulatory Visit | Attending: Family Medicine | Admitting: Family Medicine

## 2016-06-22 DIAGNOSIS — I10 Essential (primary) hypertension: Secondary | ICD-10-CM

## 2016-06-22 DIAGNOSIS — I340 Nonrheumatic mitral (valve) insufficiency: Secondary | ICD-10-CM | POA: Diagnosis not present

## 2016-06-22 DIAGNOSIS — R42 Dizziness and giddiness: Secondary | ICD-10-CM

## 2016-06-22 DIAGNOSIS — R001 Bradycardia, unspecified: Secondary | ICD-10-CM | POA: Diagnosis not present

## 2016-06-22 DIAGNOSIS — R002 Palpitations: Secondary | ICD-10-CM | POA: Insufficient documentation

## 2016-06-22 DIAGNOSIS — R011 Cardiac murmur, unspecified: Secondary | ICD-10-CM | POA: Diagnosis not present

## 2016-06-22 NOTE — Progress Notes (Signed)
*  PRELIMINARY RESULTS* Echocardiogram 2D Echocardiogram has been performed.  Debra Simon 06/22/2016, 10:27 AM

## 2016-06-26 ENCOUNTER — Encounter: Payer: Self-pay | Admitting: Family Medicine

## 2016-06-26 DIAGNOSIS — I519 Heart disease, unspecified: Secondary | ICD-10-CM | POA: Insufficient documentation

## 2016-06-26 HISTORY — DX: Heart disease, unspecified: I51.9

## 2016-07-04 ENCOUNTER — Ambulatory Visit (INDEPENDENT_AMBULATORY_CARE_PROVIDER_SITE_OTHER): Payer: Managed Care, Other (non HMO) | Admitting: Family Medicine

## 2016-07-04 ENCOUNTER — Encounter: Payer: Self-pay | Admitting: Family Medicine

## 2016-07-04 VITALS — BP 136/84 | HR 64 | Temp 98.0°F | Ht 62.0 in | Wt 166.2 lb

## 2016-07-04 DIAGNOSIS — Z9889 Other specified postprocedural states: Secondary | ICD-10-CM | POA: Diagnosis not present

## 2016-07-04 DIAGNOSIS — I10 Essential (primary) hypertension: Secondary | ICD-10-CM | POA: Diagnosis not present

## 2016-07-04 DIAGNOSIS — I340 Nonrheumatic mitral (valve) insufficiency: Secondary | ICD-10-CM | POA: Diagnosis not present

## 2016-07-04 DIAGNOSIS — H8110 Benign paroxysmal vertigo, unspecified ear: Secondary | ICD-10-CM

## 2016-07-04 DIAGNOSIS — E786 Lipoprotein deficiency: Secondary | ICD-10-CM

## 2016-07-04 DIAGNOSIS — E785 Hyperlipidemia, unspecified: Secondary | ICD-10-CM | POA: Diagnosis not present

## 2016-07-04 DIAGNOSIS — I519 Heart disease, unspecified: Secondary | ICD-10-CM | POA: Diagnosis not present

## 2016-07-04 DIAGNOSIS — E6609 Other obesity due to excess calories: Secondary | ICD-10-CM | POA: Diagnosis not present

## 2016-07-04 DIAGNOSIS — Z6831 Body mass index (BMI) 31.0-31.9, adult: Secondary | ICD-10-CM

## 2016-07-04 DIAGNOSIS — R739 Hyperglycemia, unspecified: Secondary | ICD-10-CM | POA: Diagnosis not present

## 2016-07-04 HISTORY — DX: Nonrheumatic mitral (valve) insufficiency: I34.0

## 2016-07-04 LAB — POCT GLYCOSYLATED HEMOGLOBIN (HGB A1C): Hemoglobin A1C: 5.6

## 2016-07-04 NOTE — Patient Instructions (Signed)
Results for orders placed or performed in visit on 06/01/16  CBC with Differential/Platelet  Result Value Ref Range   WBC 5.4 3.8 - 10.8 K/uL   RBC 4.69 3.80 - 5.10 MIL/uL   Hemoglobin 13.9 11.7 - 15.5 g/dL   HCT 41.6 35.0 - 45.0 %   MCV 88.7 80.0 - 100.0 fL   MCH 29.6 27.0 - 33.0 pg   MCHC 33.4 32.0 - 36.0 g/dL   RDW 13.8 11.0 - 15.0 %   Platelets 230 140 - 400 K/uL   MPV 10.4 7.5 - 12.5 fL   Neutro Abs 3,456 1,500 - 7,800 cells/uL   Lymphs Abs 1,458 850 - 3,900 cells/uL   Monocytes Absolute 378 200 - 950 cells/uL   Eosinophils Absolute 108 15 - 500 cells/uL   Basophils Absolute 0 0 - 200 cells/uL   Neutrophils Relative % 64 %   Lymphocytes Relative 27 %   Monocytes Relative 7 %   Eosinophils Relative 2 %   Basophils Relative 0 %   Smear Review Criteria for review not met   Comprehensive metabolic panel  Result Value Ref Range   Sodium 141 135 - 146 mmol/L   Potassium 3.7 3.5 - 5.3 mmol/L   Chloride 103 98 - 110 mmol/L   CO2 30 20 - 31 mmol/L   Glucose, Bld 106 (H) 65 - 99 mg/dL   BUN 14 7 - 25 mg/dL   Creat 0.57 0.50 - 0.99 mg/dL   Total Bilirubin 0.6 0.2 - 1.2 mg/dL   Alkaline Phosphatase 70 33 - 130 U/L   AST 17 10 - 35 U/L   ALT 15 6 - 29 U/L   Total Protein 7.6 6.1 - 8.1 g/dL   Albumin 4.4 3.6 - 5.1 g/dL   Calcium 9.4 8.6 - 10.4 mg/dL  Lipid panel  Result Value Ref Range   Cholesterol 172 <200 mg/dL   Triglycerides 80 <150 mg/dL   HDL 43 (L) >50 mg/dL   Total CHOL/HDL Ratio 4.0 <5.0 Ratio   VLDL 16 <30 mg/dL   LDL Cholesterol 113 (H) <100 mg/dL  Magnesium  Result Value Ref Range   Magnesium 2.1 1.5 - 2.5 mg/dL  TSH  Result Value Ref Range   TSH 2.64 mIU/L   POC A1c ________________

## 2016-07-04 NOTE — Addendum Note (Signed)
Addended by: Elmer Bales on: 07/04/2016 09:19 AM   Modules accepted: Orders

## 2016-07-04 NOTE — Progress Notes (Addendum)
Debra Simon is a 66 y.o. female is here to discuss:  History of Present Illness:  Water quality scientist, CMA, acting as scribe for Dr. Juleen China.  Chief Complaint  Patient presents with  . Follow-up  . Hypertension  . Dizziness  . Referral    Would like referral to Urology.   CC:  Patient states she is doing well since her last visit.  Dizziness has improved.  Only feels dizzy now when she is in the shower and closes her eyes to wash her hair.  Patient has been checking her blood pressure regularly at home.  She is worried that her machine may not be accurate, but her blood pressure has been in the normal range.  She asks for a referral to urology today for monitoring of prior issues.  No other complaints or concerns today.  HPI:  1. Hyperglycemia. Hx of preDM. Due for A1c. Working on weight loss.  Wt Readings from Last 3 Encounters:  07/04/16 166 lb 3.2 oz (75.4 kg)  06/01/16 172 lb (78 kg)  08/28/15 163 lb (73.9 kg)    2. Hyperlipidemia with low HDL. Started statin after last lipid panel. Tolerating well.  Lab Results  Component Value Date   CHOL 172 06/01/2016   HDL 43 (L) 06/01/2016   LDLCALC 113 (H) 06/01/2016   TRIG 80 06/01/2016   CHOLHDL 4.0 06/01/2016    3. Essential hypertension. Home blood pressure readings at goal. Avoiding excessive salt intake. Denies chest pain. Taking medications as prescribed without side effects.   BP Readings from Last 3 Encounters:  07/04/16 136/84  06/01/16 (!) 172/96  03/03/16 128/82   Lab Results  Component Value Date   CREATININE 0.57 06/01/2016    4. Benign paroxysmal vertigo, unspecified laterality. Improved.    5. History of ureter repair. Patient brings in report from 10 years ago - ureteral repair after laceration during GYN procedure. She requests referral to Urology for recheck. No concerns.     Health Maintenance Due  Topic Date Due  . Hepatitis C Screening  27-Dec-1950  . HIV Screening  11/11/1966  . TETANUS/TDAP   11/11/1970    PMHx, SurgHx, SocialHx, FamHx, Medications, and Allergies were reviewed in the Visit Navigator and updated as appropriate.   Patient Active Problem List   Diagnosis Date Noted  . Mild mitral regurgitation 07/04/2016  . Diastolic dysfunction, left ventricle 06/26/2016  . Benign paroxysmal vertigo 06/02/2016  . Heart murmur 06/02/2016  . Lacunar infarction ([place]) 06/02/2016  . Hyperlipidemia with low HDL 06/02/2016  . Vaginal dysplasia 08/28/2015  . Sensorineural hearing loss (SNHL), bilateral 07/07/2015  . Tinnitus of both ears 07/07/2015  . Rectocele 05/30/2014  . Obesity 07/31/2013  . HTN (hypertension) 07/31/2013  . Vitamin D deficiency 07/31/2013  . Hyperglycemia 07/31/2013  . Vaginal atrophy 05/24/2013  . Personal history of colonic polyps 05/24/2013    Social History  Substance Use Topics  . Smoking status: Never Smoker  . Smokeless tobacco: Never Used  . Alcohol use No     Comment: rare on holidays    Current Medications and Allergies:    Current Outpatient Prescriptions:  .  aspirin EC 81 MG tablet, Take 1 tablet (81 mg total) by mouth daily., Disp: 90 tablet, Rfl: 3 .  CALCIUM PO, Take 1 tablet by mouth daily., Disp: , Rfl:  .  cholecalciferol (VITAMIN D) 1000 UNITS tablet, Take 1,000 Units by mouth daily., Disp: , Rfl:  .  estradiol (ESTRACE) 0.1 MG/GM vaginal cream, Place  1 g vaginally 2 (two) times a week., Disp: 42.5 g, Rfl: 8 .  meclizine (ANTIVERT) 25 MG tablet, Take 1 tablet (25 mg total) by mouth 3 (three) times daily as needed for dizziness., Disp: 30 tablet, Rfl: 0 .  Multiple Vitamin (MULTI-VITAMINS) TABS, Take by mouth., Disp: , Rfl:  .  simvastatin (ZOCOR) 20 MG tablet, Take 1 tablet (20 mg total) by mouth every evening., Disp: 90 tablet, Rfl: 3 .  valsartan-hydrochlorothiazide (DIOVAN-HCT) 80-12.5 MG per tablet, Take one tablet daily **MUST HAVE PHYSICAL FOR FURTHER REFILLS**, Disp: 30 tablet, Rfl: 0  No Known Allergies  Review of  Systems   Review of Systems  Constitutional: Negative for chills and fever.  HENT: Negative for congestion and sore throat.   Eyes: Negative for blurred vision.  Respiratory: Negative for cough and shortness of breath.   Cardiovascular: Negative for chest pain and palpitations.  Gastrointestinal: Negative for abdominal pain, nausea and vomiting.  Musculoskeletal: Negative for back pain.  Skin: Negative for rash.  Neurological: Positive for dizziness. Negative for loss of consciousness.       Only when closing eyes while showering.  Psychiatric/Behavioral: Negative for depression. The patient is not nervous/anxious.     Vitals:   Vitals:   07/04/16 0820  BP: 136/84  Pulse: 64  Temp: 98 F (36.7 C)  SpO2: 99%  Weight: 166 lb 3.2 oz (75.4 kg)  Height: 5' 2" (1.575 m)     Body mass index is 30.4 kg/m.   Physical Exam:    Physical Exam  Constitutional: She is oriented to person, place, and time. She appears well-developed and well-nourished. No distress.  HENT:  Head: Normocephalic and atraumatic.  Right Ear: External ear normal.  Left Ear: External ear normal.  Nose: Nose normal.  Mouth/Throat: Oropharynx is clear and moist.  Eyes: Conjunctivae and EOM are normal. Pupils are equal, round, and reactive to light.  Neck: Normal range of motion. Neck supple.  Cardiovascular: Normal rate, regular rhythm and intact distal pulses.   Murmur heard. Pulmonary/Chest: Effort normal and breath sounds normal.  Abdominal: Soft. Bowel sounds are normal.  Neurological: She is alert and oriented to person, place, and time. She displays normal reflexes. No cranial nerve deficit or sensory deficit. She exhibits normal muscle tone. Coordination normal.  Skin: Skin is warm.  Psychiatric: She has a normal mood and affect. Her behavior is normal.  Nursing note and vitals reviewed.  Results for orders placed or performed in visit on 06/01/16  CBC with Differential/Platelet  Result Value Ref  Range   WBC 5.4 3.8 - 10.8 K/uL   RBC 4.69 3.80 - 5.10 MIL/uL   Hemoglobin 13.9 11.7 - 15.5 g/dL   HCT 41.6 35.0 - 45.0 %   MCV 88.7 80.0 - 100.0 fL   MCH 29.6 27.0 - 33.0 pg   MCHC 33.4 32.0 - 36.0 g/dL   RDW 13.8 11.0 - 15.0 %   Platelets 230 140 - 400 K/uL   MPV 10.4 7.5 - 12.5 fL   Neutro Abs 3,456 1,500 - 7,800 cells/uL   Lymphs Abs 1,458 850 - 3,900 cells/uL   Monocytes Absolute 378 200 - 950 cells/uL   Eosinophils Absolute 108 15 - 500 cells/uL   Basophils Absolute 0 0 - 200 cells/uL   Neutrophils Relative % 64 %   Lymphocytes Relative 27 %   Monocytes Relative 7 %   Eosinophils Relative 2 %   Basophils Relative 0 %   Smear Review Criteria for   review not met   Comprehensive metabolic panel  Result Value Ref Range   Sodium 141 135 - 146 mmol/L   Potassium 3.7 3.5 - 5.3 mmol/L   Chloride 103 98 - 110 mmol/L   CO2 30 20 - 31 mmol/L   Glucose, Bld 106 (H) 65 - 99 mg/dL   BUN 14 7 - 25 mg/dL   Creat 0.57 0.50 - 0.99 mg/dL   Total Bilirubin 0.6 0.2 - 1.2 mg/dL   Alkaline Phosphatase 70 33 - 130 U/L   AST 17 10 - 35 U/L   ALT 15 6 - 29 U/L   Total Protein 7.6 6.1 - 8.1 g/dL   Albumin 4.4 3.6 - 5.1 g/dL   Calcium 9.4 8.6 - 10.4 mg/dL  Lipid panel  Result Value Ref Range   Cholesterol 172 <200 mg/dL   Triglycerides 80 <150 mg/dL   HDL 43 (L) >50 mg/dL   Total CHOL/HDL Ratio 4.0 <5.0 Ratio   VLDL 16 <30 mg/dL   LDL Cholesterol 113 (H) <100 mg/dL  Magnesium  Result Value Ref Range   Magnesium 2.1 1.5 - 2.5 mg/dL  TSH  Result Value Ref Range   TSH 2.64 mIU/L   ECHO (06/22/16):  - Left ventricle: The cavity size was normal. Systolic function was normal. The estimated ejection fraction was in the range of 60% to 65%. Wall motion was normal; there were no regional wall motion abnormalities. Doppler parameters are consistent with abnormal left ventricular relaxation (grade 1 diastolic dysfunction). - Mitral valve: There was mild regurgitation. - Left atrium: The atrium  was normal in size. - Right ventricle: Systolic function was normal. - Pulmonary arteries: Systolic pressure was within the normal range.  Assessment and Plan:    Debra Simon was seen today for follow-up, hypertension, dizziness and referral.  Diagnoses and all orders for this visit:  Hyperglycemia Comments: POC A1c 5.6 today. Reviewed low carbohydrate diet.  Hyperlipidemia with low HDL Comments: Continue statin and recheck in 3-6 months.  Essential hypertension Comments: Well controlled.  No signs of complications, medication side effects, or red flags.  Continue current regimen.    Benign paroxysmal vertigo, unspecified laterality Comments: Improved.  Class 1 obesity due to excess calories without serious comorbidity with body mass index (BMI) of 31.0 to 31.9 in adult Comments: The patient is asked to continue to improve diet and exercise patterns to aid in medical management of this problem.   History of ureter repair Comments: No concerns. Surgery was 8-10 years ago. Patient requests referral. Orders: -     Ambulatory referral to Urology  Mild mitral regurgitation Diastolic dysfunction, left ventricle Comments:  Reviewed ECHO report with patient.   . Reviewed expectations re: course of current medical issues. . Discussed self-management of symptoms. . Outlined signs and symptoms indicating need for more acute intervention. . Patient verbalized understanding and all questions were answered. . See orders for this visit as documented in the electronic medical record. . Patient received an After Visit Summary.  CMA served as Education administrator during this visit. History, Physical, and Plan performed by medical provider. Documentation and orders reviewed and attested to. Briscoe Deutscher, D.O.  Briscoe Deutscher, [place], [place] 07/04/2016  Follow-up: Return in about 3 months (around 10/03/2016).

## 2016-07-04 NOTE — Progress Notes (Signed)
Pre visit review using our clinic review tool, if applicable. No additional management support is needed unless otherwise documented below in the visit note. 

## 2016-07-22 NOTE — Progress Notes (Signed)
Debra Simon is a 66 y.o. female is here to discuss:  History of Present Illness:  Water quality scientist, CMA, acting as scribe for Dr. Juleen China.  Chief Complaint  Patient presents with  . Follow-up  . Hypertension  . Dizziness  . Referral    Would like referral to Urology.   CC:  Patient states she is doing well since her last visit.  Dizziness has improved.  Only feels dizzy now when she is in the shower and closes her eyes to wash her hair.  Patient has been checking her blood pressure regularly at home.  She is worried that her machine may not be accurate, but her blood pressure has been in the normal range.  She asks for a referral to urology today for monitoring of prior issues.  No other complaints or concerns today.  HPI:  1. Hyperglycemia. Hx of preDM. Due for A1c. Working on weight loss.  Wt Readings from Last 3 Encounters:  07/04/16 166 lb 3.2 oz (75.4 kg)  06/01/16 172 lb (78 kg)  08/28/15 163 lb (73.9 kg)    2. Hyperlipidemia with low HDL. Started statin after last lipid panel. Tolerating well.  Lab Results  Component Value Date   CHOL 172 06/01/2016   HDL 43 (L) 06/01/2016   LDLCALC 113 (H) 06/01/2016   TRIG 80 06/01/2016   CHOLHDL 4.0 06/01/2016    3. Essential hypertension. Home blood pressure readings at goal. Avoiding excessive salt intake. Denies chest pain. Taking medications as prescribed without side effects.   BP Readings from Last 3 Encounters:  07/04/16 136/84  06/01/16 (!) 172/96  03/03/16 128/82   Lab Results  Component Value Date   CREATININE 0.57 06/01/2016    4. Benign paroxysmal vertigo, unspecified laterality. Improved.    5. History of ureter repair. Patient brings in report from 10 years ago - ureteral repair after laceration during GYN procedure. She requests referral to Urology for recheck. No concerns.     Health Maintenance Due  Topic Date Due  . Hepatitis C Screening  27-Dec-1950  . HIV Screening  11/11/1966  . TETANUS/TDAP   11/11/1970    PMHx, SurgHx, SocialHx, FamHx, Medications, and Allergies were reviewed in the Visit Navigator and updated as appropriate.   Patient Active Problem List   Diagnosis Date Noted  . Mild mitral regurgitation 07/04/2016  . Diastolic dysfunction, left ventricle 06/26/2016  . Benign paroxysmal vertigo 06/02/2016  . Heart murmur 06/02/2016  . Lacunar infarction ([place]) 06/02/2016  . Hyperlipidemia with low HDL 06/02/2016  . Vaginal dysplasia 08/28/2015  . Sensorineural hearing loss (SNHL), bilateral 07/07/2015  . Tinnitus of both ears 07/07/2015  . Rectocele 05/30/2014  . Obesity 07/31/2013  . HTN (hypertension) 07/31/2013  . Vitamin D deficiency 07/31/2013  . Hyperglycemia 07/31/2013  . Vaginal atrophy 05/24/2013  . Personal history of colonic polyps 05/24/2013    Social History  Substance Use Topics  . Smoking status: Never Smoker  . Smokeless tobacco: Never Used  . Alcohol use No     Comment: rare on holidays    Current Medications and Allergies:    Current Outpatient Prescriptions:  .  aspirin EC 81 MG tablet, Take 1 tablet (81 mg total) by mouth daily., Disp: 90 tablet, Rfl: 3 .  CALCIUM PO, Take 1 tablet by mouth daily., Disp: , Rfl:  .  cholecalciferol (VITAMIN D) 1000 UNITS tablet, Take 1,000 Units by mouth daily., Disp: , Rfl:  .  estradiol (ESTRACE) 0.1 MG/GM vaginal cream, Place  1 g vaginally 2 (two) times a week., Disp: 42.5 g, Rfl: 8 .  meclizine (ANTIVERT) 25 MG tablet, Take 1 tablet (25 mg total) by mouth 3 (three) times daily as needed for dizziness., Disp: 30 tablet, Rfl: 0 .  Multiple Vitamin (MULTI-VITAMINS) TABS, Take by mouth., Disp: , Rfl:  .  simvastatin (ZOCOR) 20 MG tablet, Take 1 tablet (20 mg total) by mouth every evening., Disp: 90 tablet, Rfl: 3 .  valsartan-hydrochlorothiazide (DIOVAN-HCT) 80-12.5 MG per tablet, Take one tablet daily **MUST HAVE PHYSICAL FOR FURTHER REFILLS**, Disp: 30 tablet, Rfl: 0  No Known Allergies  Review of  Systems   Review of Systems  Constitutional: Negative for chills and fever.  HENT: Negative for congestion and sore throat.   Eyes: Negative for blurred vision.  Respiratory: Negative for cough and shortness of breath.   Cardiovascular: Negative for chest pain and palpitations.  Gastrointestinal: Negative for abdominal pain, nausea and vomiting.  Musculoskeletal: Negative for back pain.  Skin: Negative for rash.  Neurological: Positive for dizziness. Negative for loss of consciousness.       Only when closing eyes while showering.  Psychiatric/Behavioral: Negative for depression. The patient is not nervous/anxious.     Vitals:   Vitals:   07/04/16 0820  BP: 136/84  Pulse: 64  Temp: 98 F (36.7 C)  SpO2: 99%  Weight: 166 lb 3.2 oz (75.4 kg)  Height: _0  (1.575 m)     Body mass index is 30.4 kg/m.   Physical Exam:    Physical Exam  Constitutional: She is oriented to person, place, and time. She appears well-developed and well-nourished. No distress.  HENT:  Head: Normocephalic and atraumatic.  Right Ear: External ear normal.  Left Ear: External ear normal.  Nose: Nose normal.  Mouth/Throat: Oropharynx is clear and moist.  Eyes: Conjunctivae and EOM are normal. Pupils are equal, round, and reactive to light.  Neck: Normal range of motion. Neck supple.  Cardiovascular: Normal rate, regular rhythm and intact distal pulses.   Murmur heard. Pulmonary/Chest: Effort normal and breath sounds normal.  Abdominal: Soft. Bowel sounds are normal.  Neurological: She is alert and oriented to person, place, and time. She displays normal reflexes. No cranial nerve deficit or sensory deficit. She exhibits normal muscle tone. Coordination normal.  Skin: Skin is warm.  Psychiatric: She has a normal mood and affect. Her behavior is normal.  Nursing note and vitals reviewed.  Results for orders placed or performed in visit on 06/01/16  CBC with Differential/Platelet  Result Value Ref  Range   WBC 5.4 3.8 - 10.8 K/uL   RBC 4.69 3.80 - 5.10 MIL/uL   Hemoglobin 13.9 11.7 - 15.5 g/dL   HCT 41.6 35.0 - 45.0 %   MCV 88.7 80.0 - 100.0 fL   MCH 29.6 27.0 - 33.0 pg   MCHC 33.4 32.0 - 36.0 g/dL   RDW 13.8 11.0 - 15.0 %   Platelets 230 140 - 400 K/uL   MPV 10.4 7.5 - 12.5 fL   Neutro Abs 3,456 1,500 - 7,800 cells/uL   Lymphs Abs 1,458 850 - 3,900 cells/uL   Monocytes Absolute 378 200 - 950 cells/uL   Eosinophils Absolute 108 15 - 500 cells/uL   Basophils Absolute 0 0 - 200 cells/uL   Neutrophils Relative % 64 %   Lymphocytes Relative 27 %   Monocytes Relative 7 %   Eosinophils Relative 2 %   Basophils Relative 0 %   Smear Review Criteria for  review not met   Comprehensive metabolic panel  Result Value Ref Range   Sodium 141 135 - 146 mmol/L   Potassium 3.7 3.5 - 5.3 mmol/L   Chloride 103 98 - 110 mmol/L   CO2 30 20 - 31 mmol/L   Glucose, Bld 106 (H) 65 - 99 mg/dL   BUN 14 7 - 25 mg/dL   Creat 0.57 0.50 - 0.99 mg/dL   Total Bilirubin 0.6 0.2 - 1.2 mg/dL   Alkaline Phosphatase 70 33 - 130 U/L   AST 17 10 - 35 U/L   ALT 15 6 - 29 U/L   Total Protein 7.6 6.1 - 8.1 g/dL   Albumin 4.4 3.6 - 5.1 g/dL   Calcium 9.4 8.6 - 10.4 mg/dL  Lipid panel  Result Value Ref Range   Cholesterol 172 <200 mg/dL   Triglycerides 80 <150 mg/dL   HDL 43 (L) >50 mg/dL   Total CHOL/HDL Ratio 4.0 <5.0 Ratio   VLDL 16 <30 mg/dL   LDL Cholesterol 113 (H) <100 mg/dL  Magnesium  Result Value Ref Range   Magnesium 2.1 1.5 - 2.5 mg/dL  TSH  Result Value Ref Range   TSH 2.64 mIU/L   ECHO (06/22/16):  - Left ventricle: The cavity size was normal. Systolic function was normal. The estimated ejection fraction was in the range of 60% to 65%. Wall motion was normal; there were no regional wall motion abnormalities. Doppler parameters are consistent with abnormal left ventricular relaxation (grade 1 diastolic dysfunction). - Mitral valve: There was mild regurgitation. - Left atrium: The atrium  was normal in size. - Right ventricle: Systolic function was normal. - Pulmonary arteries: Systolic pressure was within the normal range.  Assessment and Plan:    Debra Simon was seen today for follow-up, hypertension, dizziness and referral.  Diagnoses and all orders for this visit:  Hyperglycemia Comments: POC A1c 5.6 today. Reviewed low carbohydrate diet.  Hyperlipidemia with low HDL Comments: Continue statin and recheck in 3-6 months.  Essential hypertension Comments: Well controlled.  No signs of complications, medication side effects, or red flags.  Continue current regimen.    Benign paroxysmal vertigo, unspecified laterality Comments: Improved.  Class 1 obesity due to excess calories without serious comorbidity with body mass index (BMI) of 31.0 to 31.9 in adult Comments: The patient is asked to continue to improve diet and exercise patterns to aid in medical management of this problem.   History of ureter repair Comments: No concerns. Surgery was 8-10 years ago. Patient requests referral. Orders: -     Ambulatory referral to Urology  Mild mitral regurgitation Diastolic dysfunction, left ventricle Comments:  Reviewed ECHO report with patient.   . Reviewed expectations re: course of current medical issues. . Discussed self-management of symptoms. . Outlined signs and symptoms indicating need for more acute intervention. . Patient verbalized understanding and all questions were answered. . See orders for this visit as documented in the electronic medical record. . Patient received an After Visit Summary.  CMA served as Education administrator during this visit. History, Physical, and Plan performed by medical provider. Documentation and orders reviewed and attested to. Briscoe Deutscher, D.O.  Briscoe Deutscher, [place], [place] 07/04/2016  Follow-up: Return in about 3 months (around 10/03/2016).

## 2016-08-10 ENCOUNTER — Encounter: Payer: Self-pay | Admitting: Gynecology

## 2016-08-31 ENCOUNTER — Ambulatory Visit (INDEPENDENT_AMBULATORY_CARE_PROVIDER_SITE_OTHER): Payer: Managed Care, Other (non HMO) | Admitting: Gynecology

## 2016-08-31 ENCOUNTER — Telehealth: Payer: Self-pay | Admitting: Family Medicine

## 2016-08-31 ENCOUNTER — Encounter: Payer: Self-pay | Admitting: Gynecology

## 2016-08-31 ENCOUNTER — Ambulatory Visit: Payer: Managed Care, Other (non HMO) | Admitting: Gynecology

## 2016-08-31 VITALS — BP 126/82 | Ht 62.0 in | Wt 164.0 lb

## 2016-08-31 DIAGNOSIS — R87811 Vaginal high risk human papillomavirus (HPV) DNA test positive: Secondary | ICD-10-CM | POA: Diagnosis not present

## 2016-08-31 DIAGNOSIS — Z87411 Personal history of vaginal dysplasia: Secondary | ICD-10-CM | POA: Insufficient documentation

## 2016-08-31 MED ORDER — VALSARTAN-HYDROCHLOROTHIAZIDE 80-12.5 MG PO TABS: ORAL_TABLET | ORAL | 2 refills | Status: DC

## 2016-08-31 NOTE — Addendum Note (Signed)
Addended by: Dorothyann Gibbs on: 08/31/2016 02:27 PM   Modules accepted: Orders

## 2016-08-31 NOTE — Telephone Encounter (Signed)
Patient is requesting valsartan-hydrochlorothiazide (DIOVAN-HCT) 80-12.5 MG per tablet [[id number]] be sent to [facility] on [address]

## 2016-08-31 NOTE — Patient Instructions (Signed)
Colonoscopia en los adultos, cuidados posteriores  Colonoscopy, Adult, Care After  Lea esta información sobre cómo cuidarse después del procedimiento. Su médico también podrá darle instrucciones más específicas. Comuníquese con su médico si tiene problemas o preguntas.  ¿Qué puedo esperar después del procedimiento?  Después del procedimiento, es común tener los siguientes síntomas:  · Una pequeña cantidad de sangre en la materia fecal durante 24 horas después del procedimiento.  · Gases.  · Cólicos abdominales o meteorismo leves.    Siga estas instrucciones en su casa:  Instrucciones generales    · Durante las primeras 24 horas después del procedimiento:  ? No conduzca ni opere maquinaria.  ? No firme documentos importantes.  ? No beba alcohol.  ? Realice las actividades cotidianas habituales a un ritmo más lento que el normal.  ? Coma alimentos blandos y fáciles de digerir.  ? Descanse con frecuencia.  · Tome los medicamentos de venta libre y los recetados solamente como se lo haya indicado el médico.  · Es su responsabilidad retirar los resultados del procedimiento. Consulte a su médico o en el departamento donde se realice el procedimiento cuándo estarán listos los resultados.  Alivio de los cólicos y del meteorismo  · Cuando tenga cólicos o se sienta hinchado, intente caminar por la casa.  · Aplíquese calor en el abdomen como se lo haya indicado el médico. Use la fuente de calor que el médico le recomiende, como una compresa de calor húmedo o una almohadilla térmica.  ? Colóquese una toalla entre la piel y la fuente de calor.  ? Aplique el calor durante 20 a 30 minutos.  ? Retire la fuente de calor si la piel se le pone de color rojo brillante. Esto es muy importante si no puede sentir el dolor, el calor ni el frío. Puede correr un riesgo mayor de sufrir quemaduras.  Comida y bebida  · Beba suficiente líquido para mantener la orina clara o de color amarillo pálido.  · Retome su dieta normal como se lo haya  indicado el médico. Evite los alimentos pesados o fritos que son difíciles de digerir.  · Evite consumir alcohol durante el tiempo que le haya indicado el médico.  Comuníquese con un médico si:  · Tiene sangre en la materia fecal 2 o 3 días después del procedimiento.  Solicite ayuda de inmediato si:  · Tiene más que una pequeña mancha de sangre en la materia fecal.  · Elimina grandes coágulos de sangre en la materia fecal.  · Tiene el abdomen inflamado.  · Tiene náuseas o vómitos.  · Tiene fiebre.  · Siente cada vez más dolor en el abdomen y no se alivia con los medicamentos.  Esta información no tiene como fin reemplazar el consejo del médico. Asegúrese de hacerle al médico cualquier pregunta que tenga.  Document Released: 06/10/2008 Document Revised: 02/08/2016 Document Reviewed: 05/26/2015  [employer]vier Interactive Patient Education © 2018 [employer]

## 2016-08-31 NOTE — Telephone Encounter (Signed)
RX sent to pharmacy  

## 2016-08-31 NOTE — Progress Notes (Signed)
Patient is a 66 year old that presented to the office today for colposcopic evaluation as a result of her past history of vaginal dysplasia. Her history is as follows:  Patient was seen in May 2017 for her annual exam and her Pap smear demonstrated the following: Low-grade squamous intraepithelial lesion encompassing HPV/mild dysplasia High-risk HPV detected  Patient had brought copies at last office visit about her Pap smears of which sound were done in the past in ER: April 2016 her Pap smear was as follows: NEGATIVE FOR INTRAEPITHELIAL LESIONS OR MALIGNANCY. [name] Cytotechnologist Electronic Signature (Case signed 06/02/2014) Source Vaginal Pap [ThinPrep Imaged]  Ancillary Testing HPV 16,18/45 Genotyping NEGATIVE for HPV 16 & 18/45 Completed by PT2 on 2014-06-05 HPV High Risk **DETECTED**  Patient brought copies of previous Pap smears which will be scanned into her record but in summary as follows: Pap smear 2009 negative but positive HPV Pap smear January 2010 ASCUS positive HPV Pap smear October 2010 ASCUS with positive HPV  Patient underwent a detail colposcopic evaluation on 08/28/2015 through the following was noted: "The external genitalia, perineum and perirectal region were inspected no lesions were seen. The speculum was introduced into the vagina. A frond-like tissue hyperemic was noted at the right vaginal sidewall approximately 1 cm from the vaginal cuff. The vaginal cuff near the right side there was speckled leukoplakic areas. Both of these areas were respectively biopsy and submitted for histological evaluation. Silver nitrate and Monsel solution was used for hemostasis."  Pathology report demonstrated the following: Diagnosis 1. Vagina, biopsy, vaginal cuff - SQUAMOUS MUCOSA WITH MARKED CHRONIC INFLAMMATION AND REACTIVE EPITHELIAL CHANGES. - NO DYSPLASIA OR MALIGNANCY IDENTIFIED. - SEE COMMENT. 2. Vagina, biopsy, vaginal right side wall -  SQUAMOUS MUCOSA WITH MILD CHRONIC INFLAMMATION AND REACTIVE EPITHELIAL CHANGES. - NO DYSPLASIA OR MALIGNANCY IDENTIFIED. - SEE COMMENT. Microscopic Comment 1. -2. The diagnoses are supported with negative P16 immunostains. (MEG 09/03/15)  Her past surgical history from [place] is as follows: In 1994 patient had an attempted laparoscopic hysterectomy with laser in [place] as a result of symptomatic leiomyomatous uteri and complications in which bilateral ureteral injuries had been reported. 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 He had done a cystoscopy right retrograde pyelogram and also had attempted ureteral stents back in 1994. He did have proceeded with exploration of the ureters and performed a right ureterotomy and repair laceration of left ureter with a ureteral neo-cystotomy.   In December 2017 patient underwent a detail colposcopic evaluation it was noted at the vaginal cuff was very friable from atrophy but slightly above the vaginal cuff at the 12:00 position there were 2 small isolated leukoplakic areas which were respectively biopsied and pathology report didn't demonstrate a following: Focal koilocytotic atypia with possible HPV changes. Her Pap smear had demonstrated ASCUS with HPV changes  Patient underwent a detail colposcopic evaluation today: The external genitalia, perineum and perirectal region were inspected no lesions seen. The speculum was then introduced into the vagina is systematic inspection demonstrated at the 6:00 position and acetowhite area which was biopsied and submitted for histological evaluation. Sore 90 was used for hemostasis  Assessment/plan: Patient with past history of vaginal dysplasia as noted above acetowhite area 6:00 position of vaginal cuff noted biopsied. Will notify the patient with results once it becomes available. Patient otherwise scheduled to return  back in July of this year for annual exam. Patient is  no longer using vaginal estrogen cream she used it for 3 months.

## 2016-09-01 ENCOUNTER — Encounter: Payer: Self-pay | Admitting: Gynecology

## 2016-09-14 ENCOUNTER — Telehealth: Payer: Self-pay | Admitting: *Deleted

## 2016-09-14 MED ORDER — NONFORMULARY OR COMPOUNDED ITEM: 4 refills | Status: DC

## 2016-09-14 NOTE — Telephone Encounter (Signed)
Pt Rx for estradiol vaginal cream 0.02% was never called into pharmacy from 08/31/16 pathology report result note. Rx called in today

## 2016-09-19 ENCOUNTER — Encounter: Payer: Managed Care, Other (non HMO) | Admitting: Gynecology

## 2016-11-10 ENCOUNTER — Telehealth: Payer: Self-pay | Admitting: Family Medicine

## 2016-11-10 NOTE — Telephone Encounter (Signed)
Patient called in reference to valsartan-hydrochlorothiazide (DIOVAN-HCT) 80-12.5 MG tablet. Pharmacy sent a letter to get in contact with doctor to change the medication due to possible recall. Please call patient and advise. OK to leave message. Can also call husband at [phone]

## 2016-11-11 NOTE — Telephone Encounter (Signed)
Please call patient to schedule her an appointment.  Thank you!

## 2016-11-11 NOTE — Telephone Encounter (Signed)
Please advise 

## 2016-11-11 NOTE — Telephone Encounter (Signed)
Have her come in for an appointment to discuss the change.

## 2016-11-16 ENCOUNTER — Encounter: Payer: Self-pay | Admitting: Family Medicine

## 2016-11-16 ENCOUNTER — Ambulatory Visit: Payer: Self-pay

## 2016-11-16 ENCOUNTER — Ambulatory Visit (INDEPENDENT_AMBULATORY_CARE_PROVIDER_SITE_OTHER): Payer: Managed Care, Other (non HMO) | Admitting: Family Medicine

## 2016-11-16 ENCOUNTER — Encounter: Payer: Self-pay | Admitting: Sports Medicine

## 2016-11-16 ENCOUNTER — Ambulatory Visit (INDEPENDENT_AMBULATORY_CARE_PROVIDER_SITE_OTHER): Payer: Managed Care, Other (non HMO) | Admitting: Sports Medicine

## 2016-11-16 VITALS — BP 128/82 | HR 74 | Temp 98.0°F | Ht 62.0 in | Wt 167.6 lb

## 2016-11-16 VITALS — BP 128/82 | HR 74 | Ht 62.0 in | Wt 167.6 lb

## 2016-11-16 DIAGNOSIS — I10 Essential (primary) hypertension: Secondary | ICD-10-CM

## 2016-11-16 DIAGNOSIS — M1711 Unilateral primary osteoarthritis, right knee: Secondary | ICD-10-CM

## 2016-11-16 DIAGNOSIS — I639 Cerebral infarction, unspecified: Secondary | ICD-10-CM

## 2016-11-16 MED ORDER — LOSARTAN POTASSIUM-HCTZ 50-12.5 MG PO TABS: 1.0000 | ORAL_TABLET | Freq: Every day | ORAL | 3 refills | Status: DC

## 2016-11-16 NOTE — Progress Notes (Signed)
OFFICE VISIT NOTE Debra Simon. Debra Simon, [place] [facility]  Debra Simon - 66 y.o. female MRN [id number]  Date of birth: 1951/03/19  Visit Date: 11/16/2016  PCP: Briscoe Deutscher, DO   Referred by: Briscoe Deutscher, DO  Burlene Arnt, CMA acting as scribe for Dr. Paulla Fore.  SUBJECTIVE:   Chief Complaint  Patient presents with  . New Patient (Initial Visit)    rt knee pain   HPI: As below and per problem based documentation when appropriate.  Debra Simon is a new patient presenting today with complaint of right knee pain. Pain is mostly on the mesial aspect of the knee and at times on the anterior and lateral aspect. She has noticed some swelling in the knee.  Pain has been present x 4 years.  She has been dx with osteoarthritis.   The pain is described as constant aching with occasional stabbing sensation. and is rated as 8/10 currently.  Pain is worse when walking, going up stairs/bending, even cold air from a fan makes the pain worse. She also has increased pain after being on her feet for prolonged periods of time.  Pain improves with rest, staying off her feet.  Associates pains include: Right hip pain, she got some relief from the after last steroid injection. She denies groin pain. She also c/o right sided low back pain.     ROS  Otherwise per HPI.  HISTORY & PERTINENT PRIOR DATA:  No specialty comments available. She reports that she has never smoked. She has never used smokeless tobacco.   Recent Labs  07/04/16 0919  HGBA1C 5.6   Medications & Allergies reviewed per EMR Patient Active Problem List   Diagnosis Date Noted  . Arthritis of right knee 11/16/2016  . History of vaginal dysplasia 08/31/2016  . Mild mitral regurgitation 07/04/2016  . Diastolic dysfunction, left ventricle 06/26/2016  . Benign paroxysmal vertigo 06/02/2016  . Lacunar infarction ([place]) 06/02/2016  . Hyperlipidemia with low HDL 06/02/2016    . Vaginal dysplasia 08/28/2015  . Sensorineural hearing loss (SNHL), bilateral 07/07/2015  . Tinnitus of both ears 07/07/2015  . Rectocele 05/30/2014  . Obesity 07/31/2013  . HTN (hypertension) 07/31/2013  . Vitamin D deficiency 07/31/2013  . Hyperglycemia 07/31/2013  . Vaginal atrophy 05/24/2013  . Personal history of colonic polyps 05/24/2013   Past Medical History:  Diagnosis Date  . Arthritis   . Colon polyps 2010  . Diastolic dysfunction, left ventricle 06/26/2016   (ECHO 2018): Left ventricle: The cavity size was normal. Systolic function was normal. The estimated ejection fraction was in the range of 60% to 65%. Wall motion was normal; there were no regional wall motion abnormalities. Doppler parameters are consistent with abnormal left ventricular relaxation (grade 1 diastolic dysfunction).  . Hypertension   . Mild mitral regurgitation 07/04/2016  . Ureterovaginal fistula   . Uterine fibroid 1994  . Vaginal dysplasia    Family History  Problem Relation Age of Onset  . Cancer Father        brain  . Cancer Paternal Aunt        breast  . Diabetes Mother   . Hypertension Mother   . Hypertension Sister   . Diabetes Brother   . Cancer Paternal Uncle        stomach  . Diabetes Sister   . Cancer Paternal Aunt        uterine  . CAD Neg Hx   . Stroke Neg  Hx    Past Surgical History:  Procedure Laterality Date  . ABDOMINAL HYSTERECTOMY  1994   TAH  . COLONOSCOPY  2010   diverticulosis, polyp  . COLONOSCOPY  2012   WNL, rec rpt 7 yrs  . CYSTOSCOPY    . ESOPHAGOGASTRODUODENOSCOPY  2010   gastritis  . URETERAL EXPLORATION  JAN Starbuck ON THE RIGHT, REPAIR LACERATION ON THE LEFT   Social History   Occupational History  . Not on file.   Social History Main Topics  . Smoking status: Never Smoker  . Smokeless tobacco: Never Used  . Alcohol use No     Comment: rare on holidays  . Drug use: No  . Sexual activity: No    OBJECTIVE:  VS:   HT:5\' 2"  (157.5 cm)   WT:167 lb 9.6 oz (76 kg)  BMI:30.65    BP:128/82  HR:74bpm  TEMP: ( )  RESP:97 % EXAM: Findings:  WDWN, NAD, Non-toxic appearing Alert & appropriately interactive Not depressed or anxious appearing No increased work of breathing. Pupils are equal. EOM intact without nystagmus No clubbing or cyanosis of the extremities appreciated No significant rashes/lesions/ulcerations overlying the examined area. DP & PT pulses 2+/4.  No significant pretibial edema.  No clubbing or cyanosis Sensation intact to light touch in lower extremities.  Right Knee: Overall joint is well aligned, generalized osteoarthritic bossing Large effusion.   ROM: 0 to 120.   Extensor mechanism intact Generalized medial and lateral joint line tenderness..   Stable to varus/valgus strain & anterior/posterior drawer.    Pain with McMurray's.     [place] Limited Joint Space Structures Low Right(no Linked Charges)  Result Date: 11/16/2016 Gerda Diss, DO     11/16/2016  9:34 AM PROCEDURE NOTE - ULTRASOUND GUIDED ASPIRATION & INJECTION: Right Knee Images were obtained and interpreted by myself, Teresa Coombs, DO Images have been saved and stored to PACS system. Images obtained on: GE S7 Ultrasound machine  ULTRASOUND FINDINGS: Large Effusion, degenerative spurring appreciated DESCRIPTION OF PROCEDURE: The patient's clinical condition is marked by substantial pain and/or significant functional disability. Other conservative therapy has not provided relief, is contraindicated, or not appropriate. There is a reasonable likelihood that injection will significantly improve the patient's pain and/or functional impairment. After discussing the risks, benefits and expected outcomes of the injection and all questions were reviewed and answered, the patient wished to undergo the above named procedure. Verbal consent was obtained. The ultrasound was used to identify the target structure and adjacent  neurovascular structures. The skin was then prepped in sterile fashion and the target structure was injected under direct visualization using sterile technique as below: PREP: Alcohol, Ethel Chloride, 3cc 1% lidocaine on 25 needle APPROACH: Superiolateral, stopcock technique, 18g 1.5" needle INJECTATE: 2 cc 0.5% marcaine, 2 cc 40mg  DepoMedrol ASPIRATE: 25cc serous fluid DRESSING: Band-Aid DJO Reaction brace Post procedural instructions including recommending icing and warning signs for infection were reviewed. This procedure was well tolerated and there were no complications.  IMPRESSION: Succesful US Guided Aspiration & injection   ASSESSMENT & PLAN:     ICD-10-CM   1. Arthritis of right knee M17.11 [place] LIMITED JOINT SPACE STRUCTURES LOW RIGHT(NO LINKED CHARGES)  ================================================================= Arthritis of right knee Large effusion today with degenerative bossing consistent with OA.  Previously seen at [facility] and discussed total knee arthroplasty but she would like to defer this as long as possible.  Last injection was over a year ago and she did  quite well with this and reports 4-5 months of great improvement.  Slow residual return.  She has been referred to physical therapy by Dr. Juleen China.  We will set her up with a reaction brace from [employer] today.  Reported good improvement in her symptoms with this.  She responded well to injection.  We can consider Visco supplementation and follow-up if recurrent symptoms and would recommend Orthovisc series. ================================================================= Patient Instructions  You had an injection today.  Things to be aware of after injection are listed below: . You may experience no significant improvement or even a slight worsening in your symptoms during the first 24 to 48 hours.  After that we expect your symptoms to improve gradually over the next 2 weeks for the medicine to have its maximal  effect.  You should continue to have improvement out to 6 weeks after your injection. . Dr. Paulla Fore recommends icing the site of the injection for 20 minutes  1-2 times the day of your injection . You may shower but no swimming, tub bath or Jacuzzi for 24 hours. . If your bandage falls off this does not need to be replaced.  It is appropriate to remove the bandage after 4 hours. . You may resume light activities as tolerated unless otherwise directed per Dr. Paulla Fore during your visit  POSSIBLE STEROID SIDE EFFECTS:  Side effects from injectable steroids tend to be less than when taken orally however you may experience some of the symptoms listed below.  If experienced these should only last for a short period of time. Change in menstrual flow  Edema (swelling)  Increased appetite Skin flushing (redness)  Skin rash/acne  Thrush (oral) Yeast vaginitis    Increased sweating  Depression Increased blood glucose levels Cramping and leg/calf  Euphoria (feeling happy)  POSSIBLE PROCEDURE SIDE EFFECTS: The side effects of the injection are usually fairly minimal however if you may experience some of the following side effects that are usually self-limited and will is off on their own.  If you are concerned please feel free to call the office with questions:  Increased numbness or tingling  Nausea or vomiting  Swelling or bruising at the injection site   Please call our office if if you experience any of the following symptoms over the next week as these can be signs of infection:   Fever greater than 100.41F  Significant swelling at the injection site  Significant redness or drainage from the injection site  If after 2 weeks you are continuing to have worsening symptoms please call our office to discuss what the next appropriate actions should be including the potential for a return office visit or other diagnostic  testing.    =================================================================   Follow-up: Return in about 12 weeks (around 02/08/2017) for viscosupplementation (Orthovisc series vs Monovisc).   CMA/ATC served as Education administrator during this visit. History, Physical, and Plan performed by medical provider. Documentation and orders reviewed and attested to.      Teresa Coombs, [facility] Physician

## 2016-11-16 NOTE — Procedures (Signed)
PROCEDURE NOTE - ULTRASOUND GUIDED ASPIRATION & INJECTION: Right Knee Images were obtained and interpreted by myself, Teresa Coombs, DO  Images have been saved and stored to PACS system. Images obtained on: GE S7 Ultrasound machine  ULTRASOUND FINDINGS:  Large Effusion, degenerative spurring appreciated  DESCRIPTION OF PROCEDURE:  The patient's clinical condition is marked by substantial pain and/or significant functional disability. Other conservative therapy has not provided relief, is contraindicated, or not appropriate. There is a reasonable likelihood that injection will significantly improve the patient's pain and/or functional impairment. After discussing the risks, benefits and expected outcomes of the injection and all questions were reviewed and answered, the patient wished to undergo the above named procedure. Verbal consent was obtained. The ultrasound was used to identify the target structure and adjacent neurovascular structures. The skin was then prepped in sterile fashion and the target structure was injected under direct visualization using sterile technique as below: PREP: Alcohol, Ethel Chloride, 3cc 1% lidocaine on 25 needle APPROACH: Superiolateral, stopcock technique, 18g 1.5" needle INJECTATE: 2 cc 0.5% marcaine, 2 cc 40mg  DepoMedrol ASPIRATE: 25cc serous fluid  DRESSING: Band-Aid DJO Reaction brace  Post procedural instructions including recommending icing and warning signs for infection were reviewed. This procedure was well tolerated and there were no complications.   IMPRESSION: Succesful US Guided Aspiration & injection

## 2016-11-16 NOTE — Assessment & Plan Note (Signed)
Large effusion today with degenerative bossing consistent with OA.  Previously seen at [facility] and discussed total knee arthroplasty but she would like to defer this as long as possible.  Last injection was over a year ago and she did quite well with this and reports 4-5 months of great improvement.  Slow residual return.  She has been referred to physical therapy by Dr. Juleen China.  We will set her up with a reaction brace from [employer] today.  Reported good improvement in her symptoms with this.  She responded well to injection.  We can consider Visco supplementation and follow-up if recurrent symptoms and would recommend Orthovisc series.

## 2016-11-16 NOTE — Progress Notes (Signed)
Debra Simon is a 66 y.o. female is here for follow up.  History of Present Illness:   Shaune Pascal CMA acting as scribe for Dr. Juleen China.  HPI: Patient comes in today for follow up on her medication. She got a letter from her pharmacy stating that she needed to change her Diovan due to recall. She would also like to talk about right knee pain. She had an injection a year ago at [facility]. We have scheduled her an appointment with Dr. Paulla Fore at 9 AM.   1. Essential hypertension.   Avoiding excessive salt intake? [x]   YES  []   NO Trying to exercise on a regular basis? []   YES  [x]   NO Review: taking medications as instructed, no medication side effects noted, no TIAs, no chest pain on exertion, no dyspnea on exertion, no swelling of ankles.   Wt Readings from Last 3 Encounters:  11/16/16 167 lb 9.6 oz (76 kg)  08/31/16 164 lb (74.4 kg)  07/04/16 166 lb 3.2 oz (75.4 kg)   BP Readings from Last 3 Encounters:  11/16/16 128/82  08/31/16 126/82  07/04/16 136/84   Lab Results  Component Value Date   CREATININE 0.57 06/01/2016    2. Arthritis of right knee. Followed by [facility], Dr. Lynann Bologna. Last knee injection was 10/19/2015. Dx: end stage arthritis of right knee. Xray: bone on bone medial compartment and also significant patellofemoral changes. Unable to get up stairs.   Health Maintenance Due  Topic Date Due  . Hepatitis C Screening  19-Sep-1950  . TETANUS/TDAP  11/11/1970  . PNA vac Low Risk Adult (1 of 2 - PCV13) 11/10/2016   No flowsheet data found. PMHx, SurgHx, SocialHx, FamHx, Medications, and Allergies were reviewed in the Visit Navigator and updated as appropriate.   Patient Active Problem List   Diagnosis Date Noted  . Arthritis of right knee 11/16/2016  . History of vaginal dysplasia 08/31/2016  . Mild mitral regurgitation 07/04/2016  . Diastolic dysfunction, left ventricle 06/26/2016  . Benign paroxysmal vertigo 06/02/2016  . Lacunar infarction ([place])  06/02/2016  . Hyperlipidemia with low HDL 06/02/2016  . Vaginal dysplasia 08/28/2015  . Sensorineural hearing loss (SNHL), bilateral 07/07/2015  . Tinnitus of both ears 07/07/2015  . Rectocele 05/30/2014  . Obesity 07/31/2013  . HTN (hypertension) 07/31/2013  . Vitamin D deficiency 07/31/2013  . Hyperglycemia 07/31/2013  . Vaginal atrophy 05/24/2013  . Personal history of colonic polyps 05/24/2013   Social History  Substance Use Topics  . Smoking status: Never Smoker  . Smokeless tobacco: Never Used  . Alcohol use No     Comment: rare on holidays   Current Medications and Allergies:   .  aspirin EC 81 MG tablet, Take 1 tablet (81 mg total) by mouth daily., Disp: 90 tablet, Rfl: 3 .  CALCIUM PO, Take 1 tablet by mouth daily., Disp: , Rfl:  .  cholecalciferol (VITAMIN D) 1000 UNITS tablet, Take 1,000 Units by mouth daily., Disp: , Rfl:  .  Estradiol vaginal cream 1 ml twice weekly, Disp: 24 each, Rfl: 4 .  simvastatin (ZOCOR) 20 MG tablet, Take 1 tablet (20 mg total) by mouth every evening., Disp: 90 tablet, Rfl: 3 .  valsartan-hydrochlorothiazide 50-12.5 MG tablet, Take 1 tablet by mouth daily., Disp: 90 tablet, Rfl: 3  No Known Allergies   Review of Systems   Pertinent items are noted in the HPI. Otherwise, ROS is negative.  Vitals:   Vitals:   11/16/16 2330  BP: 128/82  Pulse: 74  Temp: 98 F (36.7 C)  TempSrc: Oral  SpO2: 97%  Weight: 167 lb 9.6 oz (76 kg)  Height: 5\' 2"  (1.575 m)     Body mass index is 30.65 kg/m.   Physical Exam:   Physical Exam  Constitutional: She appears well-nourished.  HENT:  Head: Normocephalic and atraumatic.  Eyes: Pupils are equal, round, and reactive to light. EOM are normal.  Neck: Normal range of motion. Neck supple.  Cardiovascular: Normal rate, regular rhythm and intact distal pulses.   Murmur heard. Pulmonary/Chest: Effort normal.  Abdominal: Soft.  Musculoskeletal:       Right knee: She exhibits effusion and  deformity. Tenderness found. Medial joint line and lateral joint line tenderness noted.  Skin: Skin is warm.  Psychiatric: She has a normal mood and affect. Her behavior is normal.  Nursing note and vitals reviewed.   Results for orders placed or performed in visit on 07/04/16  POCT glycosylated hemoglobin (Hb A1C)  Result Value Ref Range   Hemoglobin A1C 5.6    Assessment and Plan:   Diagnoses and all orders for this visit:  Essential hypertension Comments: Change medication due to recall. Doing well. Orders: -     losartan-hydrochlorothiazide (HYZAAR) 50-12.5 MG tablet; Take 1 tablet by mouth daily.  Arthritis of right knee Comments: US guided injection today with Dr. Paulla Fore. Orders: -     Ambulatory referral to Physical Therapy   . Reviewed expectations re: course of current medical issues. . Discussed self-management of symptoms. . Outlined signs and symptoms indicating need for more acute intervention. . Patient verbalized understanding and all questions were answered. Marland Kitchen Health Maintenance issues including appropriate healthy diet, exercise, and smoking avoidance were discussed with patient. . See orders for this visit as documented in the electronic medical record. . Patient received an After Visit Summary.  CMA served as Education administrator during this visit. History, Physical, and Plan performed by medical provider. The above documentation has been reviewed and is accurate and complete. Briscoe Deutscher, D.O.  Briscoe Deutscher, DO [place] [place] 11/16/2016  Future Appointments Date Time Provider [place]  11/16/2016 9:00 AM Gerda Diss, DO [facility] None

## 2016-11-16 NOTE — Patient Instructions (Signed)

## 2016-12-28 ENCOUNTER — Ambulatory Visit: Payer: Managed Care, Other (non HMO)

## 2017-01-20 ENCOUNTER — Encounter: Payer: Self-pay | Admitting: Family Medicine

## 2017-01-20 ENCOUNTER — Ambulatory Visit (INDEPENDENT_AMBULATORY_CARE_PROVIDER_SITE_OTHER): Payer: Medicare Other | Admitting: Family Medicine

## 2017-01-20 VITALS — BP 128/80 | HR 56 | Temp 97.9°F | Ht 62.0 in | Wt 168.4 lb

## 2017-01-20 DIAGNOSIS — E786 Lipoprotein deficiency: Secondary | ICD-10-CM | POA: Diagnosis not present

## 2017-01-20 DIAGNOSIS — I10 Essential (primary) hypertension: Secondary | ICD-10-CM

## 2017-01-20 DIAGNOSIS — E785 Hyperlipidemia, unspecified: Secondary | ICD-10-CM

## 2017-01-20 DIAGNOSIS — Z0000 Encounter for general adult medical examination without abnormal findings: Secondary | ICD-10-CM | POA: Diagnosis not present

## 2017-01-20 DIAGNOSIS — E2839 Other primary ovarian failure: Secondary | ICD-10-CM

## 2017-01-20 DIAGNOSIS — Z1159 Encounter for screening for other viral diseases: Secondary | ICD-10-CM

## 2017-01-20 DIAGNOSIS — R350 Frequency of micturition: Secondary | ICD-10-CM

## 2017-01-20 LAB — COMPREHENSIVE METABOLIC PANEL
ALT: 18 U/L (ref 0–35)
AST: 20 U/L (ref 0–37)
Albumin: 4.5 g/dL (ref 3.5–5.2)
Alkaline Phosphatase: 66 U/L (ref 39–117)
BUN: 14 mg/dL (ref 6–23)
CO2: 33 mEq/L — ABNORMAL HIGH (ref 19–32)
Calcium: 9.6 mg/dL (ref 8.4–10.5)
Chloride: 102 mEq/L (ref 96–112)
Creatinine, Ser: 0.53 mg/dL (ref 0.40–1.20)
GFR: 122.98 mL/min (ref >60.00)
Glucose, Bld: 104 mg/dL — ABNORMAL HIGH (ref 70–99)
Potassium: 3.8 mEq/L (ref 3.5–5.1)
Sodium: 141 mEq/L (ref 135–145)
Total Bilirubin: 0.6 mg/dL (ref 0.2–1.2)
Total Protein: 7.1 g/dL (ref 6.0–8.3)

## 2017-01-20 LAB — POCT URINALYSIS DIPSTICK
Bilirubin, UA: NEGATIVE
Blood, UA: NEGATIVE
Glucose, UA: NEGATIVE
Ketones, UA: NEGATIVE
Leukocytes, UA: NEGATIVE
Nitrite, UA: NEGATIVE
Protein, UA: NEGATIVE
Spec Grav, UA: 1.015 (ref 1.010–1.025)
Urobilinogen, UA: 0.2 E.U./dL
pH, UA: 8.5 — AB (ref 5.0–8.0)

## 2017-01-20 LAB — LIPID PANEL
Cholesterol: 152 mg/dL (ref 0–200)
HDL: 45.2 mg/dL (ref >39.00)
LDL Cholesterol: 88 mg/dL (ref 0–99)
NonHDL: 106.78
Total CHOL/HDL Ratio: 3
Triglycerides: 94 mg/dL (ref 0.0–149.0)
VLDL: 18.8 mg/dL (ref 0.0–40.0)

## 2017-01-20 MED ORDER — ASPIRIN EC 81 MG PO TBEC: 81.0000 mg | DELAYED_RELEASE_TABLET | Freq: Every day | ORAL | 3 refills | Status: DC

## 2017-01-20 MED ORDER — SIMVASTATIN 20 MG PO TABS: 20.0000 mg | ORAL_TABLET | Freq: Every evening | ORAL | 3 refills | Status: DC

## 2017-01-20 NOTE — Patient Instructions (Signed)
Schedule Bone Density Test.

## 2017-01-20 NOTE — Progress Notes (Signed)
Subjective:    Debra Simon is a 66 y.o. female who presents for Medicare Initial Preventive Examination.  Preventive Screening-Counseling & Management  Tobacco History  Smoking Status  . Never Smoker  Smokeless Tobacco  . Never Used    Current Problems (verified) Patient Active Problem List   Diagnosis Date Noted  . Arthritis of right knee 11/16/2016  . History of vaginal dysplasia 08/31/2016  . Mild mitral regurgitation 07/04/2016  . Diastolic dysfunction, left ventricle 06/26/2016  . Benign paroxysmal vertigo 06/02/2016  . Lacunar infarction 06/02/2016  . Hyperlipidemia with low HDL 06/02/2016  . Vaginal dysplasia 08/28/2015  . Sensorineural hearing loss (SNHL), bilateral 07/07/2015  . Tinnitus of both ears 07/07/2015  . Rectocele 05/30/2014  . Obesity 07/31/2013  . HTN (hypertension) 07/31/2013  . Vitamin D deficiency 07/31/2013  . Hyperglycemia 07/31/2013  . Vaginal atrophy 05/24/2013  . Personal history of colonic polyps 05/24/2013   Medications Prior to Visit Current Outpatient Prescriptions on File Prior to Visit  Medication Sig Dispense Refill  . aspirin EC 81 MG tablet Take 1 tablet (81 mg total) by mouth daily. 90 tablet 3  . CALCIUM PO Take 1 tablet by mouth daily.    . cholecalciferol (VITAMIN D) 1000 UNITS tablet Take 1,000 Units by mouth daily.    Marland Kitchen losartan-hydrochlorothiazide (HYZAAR) 50-12.5 MG tablet Take 1 tablet by mouth daily. 90 tablet 3  . meclizine (ANTIVERT) 25 MG tablet Take 1 tablet (25 mg total) by mouth 3 (three) times daily as needed for dizziness. 30 tablet 0  . Multiple Vitamin (MULTI-VITAMINS) TABS Take by mouth.    . NONFORMULARY OR COMPOUNDED ITEM Estradiol vaginal cream 1 ml twice weekly 24 each 4  . simvastatin (ZOCOR) 20 MG tablet Take 1 tablet (20 mg total) by mouth every evening. 90 tablet 3   No current facility-administered medications on file prior to visit.    Current Medications (verified) Current Outpatient Prescriptions   Medication Sig Dispense Refill  . aspirin EC 81 MG tablet Take 1 tablet (81 mg total) by mouth daily. 90 tablet 3  . CALCIUM PO Take 1 tablet by mouth daily.    . cholecalciferol (VITAMIN D) 1000 UNITS tablet Take 1,000 Units by mouth daily.    Marland Kitchen losartan-hydrochlorothiazide (HYZAAR) 50-12.5 MG tablet Take 1 tablet by mouth daily. 90 tablet 3  . meclizine (ANTIVERT) 25 MG tablet Take 1 tablet (25 mg total) by mouth 3 (three) times daily as needed for dizziness. 30 tablet 0  . Multiple Vitamin (MULTI-VITAMINS) TABS Take by mouth.    . NONFORMULARY OR COMPOUNDED ITEM Estradiol vaginal cream 1 ml twice weekly 24 each 4  . simvastatin (ZOCOR) 20 MG tablet Take 1 tablet (20 mg total) by mouth every evening. 90 tablet 3   No current facility-administered medications for this visit.     Allergies (verified) Patient has no known allergies.   PAST HISTORY  Family History  Problem Relation Age of Onset  . Brain cancer Father   . Breast cancer Paternal Aunt   . Diabetes Mother   . Hypertension Mother   . Hypertension Sister   . Diabetes Brother   . Stomach cancer Paternal Uncle   . Diabetes Sister   . Uterine cancer Paternal Aunt   . CAD Neg Hx   . Stroke Neg Hx    Social History  Substance Use Topics  . Smoking status: Never Smoker  . Smokeless tobacco: Never Used  . Alcohol use No     Comment:  rare on holidays    Are there smokers in your home (other than you)? No  Opioid history? No  Risk Factors Current exercise habits: The patient does not participate in regular exercise at present.  Dietary issues discussed: patient does not have concerns.   Cardiac risk factors: advanced age (older than 50 for men, 80 for women), dyslipidemia, hypertension, obesity (BMI >= 30 kg/m2) and sedentary lifestyle.  Depression Screen (Note: if answer to either of the following is "Yes", a more complete depression screening is indicated)   Over the past 2 weeks, have you felt down, depressed or  hopeless? No  Over the past 2 weeks, have you felt little interest or pleasure in doing things? No  Have you lost interest or pleasure in daily life? No  Do you often feel hopeless? No  Do you cry easily over simple problems? No  Activities of Daily Living In your present state of health, do you have any difficulty performing the following activities?:  Driving? No Managing money?  No Feeding yourself? No Getting from bed to chair? No Climbing a flight of stairs? No Preparing food and eating?: No Bathing or showering? No Getting dressed: No Getting to the toilet? No Using the toilet:No Moving around from place to place: No In the past year have you fallen or had a near fall?:No   Are you sexually active?  Yes  Do you have more than one partner?  No  Hearing Difficulties: No Do you often ask people to speak up or repeat themselves? No Do you experience ringing or noises in your ears? No Do you have difficulty understanding soft or whispered voices? No   Do you feel that you have a problem with memory? No  Do you often misplace items? No  Do you feel safe at home?  Yes  Cognitive Testing  Alert? Yes  Normal Appearance?Yes  Oriented to person? Yes  Place? Yes   Time? Yes  Recall of three objects?  Yes  Can perform simple calculations? Yes  Displays appropriate judgment?Yes  Can read the correct time from a watch face?Yes   Advanced Directives have been discussed with the patient? Yes  List the Names of Other Physician/Practitioners you currently use:  Patient Care Team: Briscoe Deutscher, DO as PCP - General (Family Medicine)  Indicate any recent Medical Services you may have received from other than [facility] providers in the past year (date may be approximate).  Immunization History  Administered Date(s) Administered  . Zoster Recombinat (Shingrix) 07/22/2016, 11/18/2016   Screening Tests Health Maintenance  Topic Date Due  . Hepatitis C Screening  [birth year]  .  TETANUS/TDAP  11/11/1970  . PNA vac Low Risk Adult (1 of 2 - PCV13) 11/10/2016  . INFLUENZA VACCINE  02/09/2018 (Originally 10/26/2016)  . MAMMOGRAM  01/18/2018  . PAP SMEAR  03/04/2019  . COLONOSCOPY  06/02/2020  . DEXA SCAN  Completed  . HIV Screening  Completed   All answers were reviewed with the patient and necessary referrals were made:  Briscoe Deutscher, DO   01/21/2017   History reviewed: allergies, current medications, past family history, past medical history, past social history, past surgical history and problem list  Review of Systems Pertinent items are noted in HPI.    Objective:   See screening.  Body mass index is 30.8 kg/m. BP 128/80   Pulse (!) 56   Temp 97.9 F (36.6 C) (Oral)   Ht 5\' 2"  (1.575 m)   Wt 168 lb  6.4 oz (76.4 kg)   SpO2 97%   BMI 30.80 kg/m  General appearance: alert, cooperative and appears stated age Head: normocephalic, without obvious abnormality, atraumatic Eyes: conjunctivae/corneas clear. PERRL, EOM's intact. Fundi benign. Ears: normal TM's and external ear canals both ears Nose: nares normal. Septum midline. Mucosa normal. No drainage or sinus tenderness. Throat: lips, mucosa, and tongue normal; teeth and gums normal Neck: no adenopathy, no carotid bruit, no JVD, supple, symmetrical, trachea midline and thyroid not enlarged, symmetric, no tenderness/mass/nodules Lungs: clear to auscultation bilaterally Heart: regular rate and rhythm, S1, S2 normal, no murmur, click, rub or gallop Abdomen: soft, non-tender; bowel sounds normal; no masses,  no organomegaly Extremities: extremities normal, atraumatic, no cyanosis or edema Pulses: 2+ and symmetric Skin: skin color, texture, turgor normal. No rashes or lesions Neurologic: Grossly normal   Assessment:   Diagnoses and all orders for this visit:  Encounter for initial preventive physical examination covered by Medicare -     Comprehensive metabolic panel  Estrogen deficiency -     DG  Bone Density; Future  Urinary frequency -     POCT urinalysis dipstick  Hyperlipidemia with low HDL -     Lipid panel -     simvastatin (ZOCOR) 20 MG tablet; Take 1 tablet (20 mg total) by mouth every evening.  Encounter for screening for other viral diseases -     Hepatitis C antibody  Essential hypertension Comments: Controlled. Orders: -     aspirin EC 81 MG tablet; Take 1 tablet (81 mg total) by mouth daily.   Plan:   During the course of the visit the patient was educated and counseled about appropriate screening and preventive services including:    Nutrition counseling   Advanced directives: will communicate with husband re: living will.  Diet review for nutrition referral? Yes []   Not Indicated [x]     Patient Instructions (the written plan) was given to the patient.  Medicare Attestation I have personally reviewed: The patient's medical and social history Their use of alcohol, tobacco or illicit drugs Their current medications and supplements The patient's functional ability including ADLs,fall risks, home safety risks, cognitive, and hearing and visual impairment Diet and physical activities Evidence for depression or mood disorders  The patient's weight, height, BMI, and visual acuity have been recorded in the chart.  I have made referrals, counseling, and provided education to the patient based on review of the above and I have provided the patient with a written personalized care plan for preventive services.    Briscoe Deutscher, DO   01/21/2017

## 2017-01-21 LAB — HEPATITIS C ANTIBODY
Hepatitis C Ab: NONREACTIVE
SIGNAL TO CUT-OFF: 0.01 (ref <1.00)

## 2017-02-06 ENCOUNTER — Other Ambulatory Visit: Payer: Managed Care, Other (non HMO)

## 2017-02-08 ENCOUNTER — Ambulatory Visit: Payer: Managed Care, Other (non HMO) | Admitting: Sports Medicine

## 2017-02-10 ENCOUNTER — Ambulatory Visit (INDEPENDENT_AMBULATORY_CARE_PROVIDER_SITE_OTHER): Payer: Medicare Other | Admitting: Physician Assistant

## 2017-02-10 ENCOUNTER — Encounter: Payer: Self-pay | Admitting: Physician Assistant

## 2017-02-10 VITALS — BP 130/80 | HR 99 | Temp 100.3°F | Ht 62.0 in | Wt 169.5 lb

## 2017-02-10 DIAGNOSIS — R509 Fever, unspecified: Secondary | ICD-10-CM | POA: Diagnosis not present

## 2017-02-10 DIAGNOSIS — J029 Acute pharyngitis, unspecified: Secondary | ICD-10-CM

## 2017-02-10 DIAGNOSIS — R300 Dysuria: Secondary | ICD-10-CM | POA: Diagnosis not present

## 2017-02-10 LAB — POCT URINALYSIS DIPSTICK
BILIRUBIN UA: NEGATIVE
Blood, UA: 80
Glucose, UA: NEGATIVE
KETONES UA: NEGATIVE
Nitrite, UA: POSITIVE
PH UA: 7.5 (ref 5.0–8.0)
Protein, UA: 15
SPEC GRAV UA: 1.015 (ref 1.010–1.025)
Urobilinogen, UA: 0.2 E.U./dL

## 2017-02-10 LAB — POCT RAPID STREP A (OFFICE): RAPID STREP A SCREEN: NEGATIVE

## 2017-02-10 MED ORDER — AMOXICILLIN-POT CLAVULANATE 875-125 MG PO TABS
1.0000 | ORAL_TABLET | Freq: Two times a day (BID) | ORAL | 0 refills | Status: DC
Start: 2017-02-10 — End: 2017-03-14

## 2017-02-10 NOTE — Progress Notes (Signed)
Debra Simon is a 66 y.o. female here for a new problem.  I acted as a Education administrator for [employer], PA-C Anselmo Pickler, LPN  History of Present Illness:   Chief Complaint  Patient presents with  . Fever  . Sore Throat  . Generalized Body Aches    Fever   This is a new problem. Episode onset: started Wed night. The problem occurs intermittently. The problem has been unchanged. Her temperature was unmeasured prior to arrival. Associated symptoms include headaches, nausea and a sore throat. Pertinent negatives include no coughing. She has tried NSAIDs (Aleve) for the symptoms. The treatment provided mild relief.  Sore Throat   This is a new problem. Episode onset: Started Monday. The problem has been unchanged. Neither side of throat is experiencing more pain than the other. The pain is at a severity of 6/10. The pain is moderate. Associated symptoms include headaches and a hoarse voice. Pertinent negatives include no coughing or trouble swallowing. Exposure to: Husband sick with viral illness 2 weeks. She has tried NSAIDs and cool liquids (Aleve) for the symptoms. The treatment provided mild relief.   Appetite is poor. Endorses nausea. Normally has constipation, last BM was yesterday. No recent travel.  No history of bronchitis, PNA. Worst symptom: throat pain. She does endorse malodorous and dark urine, as well as a bit of burning. No back pain or abdominal pain. Does have UTI history. Feels like she is pushing fluids. Denies hx of pyelo.   Past Medical History:  Diagnosis Date  . Arthritis   . Colon polyps 2010  . Diastolic dysfunction, left ventricle 06/26/2016   (ECHO 2018): Left ventricle: The cavity size was normal. Systolic function was normal. The estimated ejection fraction was in the range of 60% to 65%. Wall motion was normal; there were no regional wall motion abnormalities. Doppler parameters are consistent with abnormal left ventricular relaxation (grade 1 diastolic  dysfunction).  . Hypertension   . Mild mitral regurgitation 07/04/2016  . Ureterovaginal fistula   . Uterine fibroid 1994  . Vaginal dysplasia      Social History   Socioeconomic History  . Marital status: Married    Spouse name: Not on file  . Number of children: Not on file  . Years of education: Not on file  . Highest education level: Not on file  Social Needs  . Financial resource strain: Not on file  . Food insecurity - worry: Not on file  . Food insecurity - inability: Not on file  . Transportation needs - medical: Not on file  . Transportation needs - non-medical: Not on file  Occupational History  . Not on file  Tobacco Use  . Smoking status: Never Smoker  . Smokeless tobacco: Never Used  Substance and Sexual Activity  . Alcohol use: No    Alcohol/week: 0.0 oz    Comment: rare on holidays  . Drug use: No  . Sexual activity: No  Other Topics Concern  . Not on file  Social History Narrative   Lives with nephew   Occupation: housewife   Edu: GED   [place] - lives intermittently to [place] where she prior lived.   Activity: gardens, housework, no regular exercise   Diet: good water, fruits/vegetables daily    Past Surgical History:  Procedure Laterality Date  . ABDOMINAL HYSTERECTOMY  1994   TAH  . COLONOSCOPY  2010   diverticulosis, polyp  . COLONOSCOPY  2012   WNL, rec rpt 7 yrs  . CYSTOSCOPY    .  ESOPHAGOGASTRODUODENOSCOPY  2010   gastritis  . URETERAL EXPLORATION  [facility] [place] ON THE RIGHT, REPAIR LACERATION ON THE LEFT    Family History  Problem Relation Age of Onset  . Brain cancer Father   . Breast cancer Paternal Aunt   . Diabetes Mother   . Hypertension Mother   . Hypertension Sister   . Diabetes Brother   . Stomach cancer Paternal Uncle   . Diabetes Sister   . Uterine cancer Paternal Aunt   . CAD Neg Hx   . Stroke Neg Hx     No Known Allergies  Current Medications:   Current Outpatient Medications:  .  aspirin  EC 81 MG tablet, Take 1 tablet (81 mg total) by mouth daily., Disp: 90 tablet, Rfl: 3 .  CALCIUM PO, Take 1 tablet by mouth daily., Disp: , Rfl:  .  cholecalciferol (VITAMIN D) 1000 UNITS tablet, Take 1,000 Units by mouth daily., Disp: , Rfl:  .  losartan-hydrochlorothiazide (HYZAAR) 50-12.5 MG tablet, Take 1 tablet by mouth daily., Disp: 90 tablet, Rfl: 3 .  meclizine (ANTIVERT) 25 MG tablet, Take 1 tablet (25 mg total) by mouth 3 (three) times daily as needed for dizziness., Disp: 30 tablet, Rfl: 0 .  Multiple Vitamin (MULTI-VITAMINS) TABS, Take by mouth., Disp: , Rfl:  .  NONFORMULARY OR COMPOUNDED ITEM, Estradiol vaginal cream 1 ml twice weekly, Disp: 24 each, Rfl: 4 .  amoxicillin-clavulanate (AUGMENTIN) 875-125 MG tablet, Take 1 tablet 2 (two) times daily by mouth., Disp: 20 tablet, Rfl: 0 .  simvastatin (ZOCOR) 20 MG tablet, Take 1 tablet (20 mg total) by mouth every evening. (Patient not taking: Reported on 02/10/2017), Disp: 90 tablet, Rfl: 3   Review of Systems:   Review of Systems  Constitutional: Positive for fever.  HENT: Positive for hoarse voice and sore throat. Negative for trouble swallowing.   Respiratory: Negative for cough.   Gastrointestinal: Positive for nausea.  Neurological: Positive for headaches.    Vitals:   Vitals:   02/10/17 0905 02/10/17 0927  BP: (!) 150/90 130/80  Pulse: 99   Temp: 100.3 F (37.9 C)   TempSrc: Oral   SpO2: 96%   Weight: 169 lb 8 oz (76.9 kg)   Height: 5\' 2"  (1.575 m)      Body mass index is 31 kg/m.  Physical Exam:   Physical Exam  Constitutional: She appears well-developed. She is cooperative.  Non-toxic appearance. She does not have a sickly appearance. She does not appear ill. No distress.  HENT:  Head: Normocephalic and atraumatic.  Right Ear: Tympanic membrane, external ear and ear canal normal. Tympanic membrane is not erythematous, not retracted and not bulging.  Left Ear: Tympanic membrane, external ear and ear canal  normal. Tympanic membrane is not erythematous, not retracted and not bulging.  Nose: Nose normal. Right sinus exhibits no maxillary sinus tenderness and no frontal sinus tenderness. Left sinus exhibits no maxillary sinus tenderness and no frontal sinus tenderness.  Mouth/Throat: Uvula is midline and mucous membranes are normal. Posterior oropharyngeal edema and posterior oropharyngeal erythema present. Tonsils are 1+ on the right. Tonsils are 1+ on the left.  Eyes: Conjunctivae and lids are normal.  Neck: Trachea normal.  Cardiovascular: Normal rate, regular rhythm, S1 normal, S2 normal and normal heart sounds.  Pulmonary/Chest: Effort normal and breath sounds normal. She has no decreased breath sounds. She has no wheezes. She has no rhonchi. She has no rales.  Abdominal: There is no  tenderness. There is no CVA tenderness.  Lymphadenopathy:    She has no cervical adenopathy.  Neurological: She is alert.  Skin: Skin is warm, dry and intact.  Psychiatric: She has a normal mood and affect. Her speech is normal and behavior is normal.  Nursing note and vitals reviewed.  Results for orders placed or performed in visit on 02/10/17  POCT rapid strep A  Result Value Ref Range   Rapid Strep A Screen Negative Negative  POCT urinalysis dipstick  Result Value Ref Range   Color, UA Yellow    Clarity, UA Cloudy    Glucose, UA Negative    Bilirubin, UA Negative    Ketones, UA Negative    Spec Grav, UA 1.015 1.010 - 1.025   Blood, UA 80 Ery/uL    pH, UA 7.5 5.0 - 8.0   Protein, UA 15 mg/dL    Urobilinogen, UA 0.2 0.2 or 1.0 E.U./dL   Nitrite, UA Positive    Leukocytes, UA Small (1+) (A) Negative     Assessment and Plan:    Debra Simon was seen today for fever, sore throat and generalized body aches.  Diagnoses and all orders for this visit:  Fever, unspecified fever cause, Sore throat, Dysuria Suspect URI as well as early cystitis given lab findings. Rapid strep negative. Push fluids, start  Augmentin. Urine culture pending. I advised her to follow-up if symptoms worsen or persist despite treatment. Patient verbalized understanding. No red flags on exam today. -     POCT rapid strep A -     POCT urinalysis dipstick -     Urine Culture  Other orders -     amoxicillin-clavulanate (AUGMENTIN) 875-125 MG tablet; Take 1 tablet 2 (two) times daily by mouth.  . Reviewed expectations re: course of current medical issues. . Discussed self-management of symptoms. . Outlined signs and symptoms indicating need for more acute intervention. . Patient verbalized understanding and all questions were answered. . See orders for this visit as documented in the electronic medical record. . Patient received an After-Visit Summary.  CMA or LPN served as scribe during this visit. History, Physical, and Plan performed by medical provider. Documentation and orders reviewed and attested to.  Inda Coke, PA-C

## 2017-02-10 NOTE — Patient Instructions (Signed)
It was great to see you!  Push fluids.  It looks like you have a urinary tract infection and upper respiratory infection. May take Tylenol for fever. Start oral antibiotic today.  We will call with your urine culture results.  Follow-up in the meantime if symptoms worsen or persist.

## 2017-02-13 LAB — URINE CULTURE
MICRO NUMBER: 81295833
SPECIMEN QUALITY: ADEQUATE

## 2017-02-15 MED ORDER — CIPROFLOXACIN HCL 250 MG PO TABS: 250.0000 mg | ORAL_TABLET | Freq: Two times a day (BID) | ORAL | 0 refills | Status: DC

## 2017-02-15 NOTE — Addendum Note (Signed)
Addended by: Briscoe Deutscher R on: 02/15/2017 08:04 AM   Modules accepted: Orders

## 2017-03-14 ENCOUNTER — Ambulatory Visit (INDEPENDENT_AMBULATORY_CARE_PROVIDER_SITE_OTHER): Payer: Medicare Other | Admitting: Family Medicine

## 2017-03-14 ENCOUNTER — Encounter: Payer: Self-pay | Admitting: Family Medicine

## 2017-03-14 VITALS — BP 126/74 | HR 71 | Temp 98.1°F | Ht 62.0 in | Wt 167.2 lb

## 2017-03-14 DIAGNOSIS — R319 Hematuria, unspecified: Secondary | ICD-10-CM

## 2017-03-14 DIAGNOSIS — R059 Cough, unspecified: Secondary | ICD-10-CM

## 2017-03-14 DIAGNOSIS — R05 Cough: Secondary | ICD-10-CM | POA: Diagnosis not present

## 2017-03-14 DIAGNOSIS — R300 Dysuria: Secondary | ICD-10-CM

## 2017-03-14 LAB — POCT URINALYSIS DIPSTICK
BILIRUBIN UA: NEGATIVE
Glucose, UA: NEGATIVE
Ketones, UA: NEGATIVE
LEUKOCYTES UA: NEGATIVE
Nitrite, UA: NEGATIVE
PH UA: 6 (ref 5.0–8.0)
Protein, UA: 15
Spec Grav, UA: >=1.03 — AB (ref 1.010–1.025)
UROBILINOGEN UA: 0.2 U/dL

## 2017-03-14 MED ORDER — CEPHALEXIN 500 MG PO CAPS: 500.0000 mg | ORAL_CAPSULE | Freq: Two times a day (BID) | ORAL | 0 refills | Status: AC

## 2017-03-14 MED ORDER — IPRATROPIUM BROMIDE 0.06 % NA SOLN: 2.0000 | Freq: Four times a day (QID) | NASAL | 0 refills | Status: DC

## 2017-03-14 MED ORDER — BENZONATATE 200 MG PO CAPS: 200.0000 mg | ORAL_CAPSULE | Freq: Two times a day (BID) | ORAL | 0 refills | Status: DC | PRN

## 2017-03-14 NOTE — Patient Instructions (Signed)
Start the keflex and atrovent.  We will call you with the culture results.  Take care,  Dr Jerline Pain

## 2017-03-14 NOTE — Assessment & Plan Note (Signed)
Noted on last 2 UAs in setting of likely UTI.  We will need to monitor for resolution of the next 4-6 weeks pending resolution of her UTI symptoms.

## 2017-03-14 NOTE — Progress Notes (Signed)
   Subjective:  Debra Simon is a 66 y.o. female who presents today with a chief complaint of cough.   HPI:  Cough, acute issue Started 3 days ago. Worsened over that time. Associated with sinus congestion, rhinorrhea, and sore throat.  Tried over-the-counter medications including Robitussin which did not centrically seem to help.  No known sick contacts.  No precipitating events.  No other obvious alleviating or aggravating factors.  Dysuria, acute issue Symptoms started a few days ago as well.  Symptoms are consistent with her prior UTIs.  She was diagnosed with UTI approximately 1 month ago and treated with a course of Cipro to resolve her symptoms.  She does have some low-grade fevers.  A little bit of low back pain.  No chills.  No nausea or vomiting.  ROS: Per HPI  PMH: Smoking history reviewed.  Never smoker.  Objective:  Physical Exam: BP 126/74 (BP Location: Left Arm, Patient Position: Sitting, Cuff Size: Normal)   Pulse 71   Temp 98.1 F (36.7 C) (Oral)   Ht 5\' 2"  (1.575 m)   Wt 167 lb 3.2 oz (75.8 kg)   SpO2 97%   BMI 30.58 kg/m   Gen: NAD, resting comfortably HEENT: TMs with clear effusion bilaterally.  Oropharynx erythematous without exudate.  No lymphadenopathy. CV: RRR, soft 2 out of 6 systolic murmur noted. Pulm: NWOB, CTAB with no crackles, wheezes, or rhonchi GI: Normal bowel sounds present. Soft, Nontender, Nondistended. MSK: Mild low back tenderness. Skin: Warm, dry Neuro: Grossly normal, moves all extremities Psych: Normal affect and thought content  Results for orders placed or performed in visit on 03/14/17 (from the past 72 hour(s))  POCT urinalysis dipstick     Status: Abnormal   Collection Time: 03/14/17  9:44 AM  Result Value Ref Range   Color, UA Yellow    Clarity, UA Clear    Glucose, UA Negative    Bilirubin, UA Negative    Ketones, UA Negative    Spec Grav, UA >=1.030 (A) 1.010 - 1.025   Blood, UA 2+     Comment: 80 Ery/uL   pH, UA 6.0  5.0 - 8.0   Protein, UA 15    Urobilinogen, UA 0.2 0.2 or 1.0 E.U./dL   Nitrite, UA Negative    Leukocytes, UA Negative Negative   Appearance     Odor       Assessment/Plan:  Cough Lung exam clear with no signs of bronchitis or pneumonia.  Likely upper respiratory infection.  Start Atrovent nasal spray.  Prescription for Tessalon sent in as well. continue Tylenol and/or Motrin as needed for low-grade fever and pain.  Encouraged good oral hydration.  Return precautions reviewed.  Follow-up as needed.  Dysuria UA with negative nitrites and leukocytes however positive for blood.  Start empiric Keflex.  Sent for urine culture.  Hematuria Noted on last 2 UAs in setting of likely UTI.  We will need to monitor for resolution of the next 4-6 weeks pending resolution of her UTI symptoms.  Algis Greenhouse. Jerline Pain, MD 03/14/2017 9:51 AM

## 2017-03-15 LAB — URINE CULTURE
MICRO NUMBER:: 81421823
SPECIMEN QUALITY:: ADEQUATE

## 2017-03-16 NOTE — Progress Notes (Signed)
Urine culture negative. She does not have a UTI. She can safely stop her antibiotics. If she is still having burning with urination, she needs another office visit.  Algis Greenhouse. Jerline Pain, MD 03/16/2017 12:40 PM

## 2017-03-24 ENCOUNTER — Other Ambulatory Visit: Payer: Self-pay | Admitting: Obstetrics & Gynecology

## 2017-03-24 ENCOUNTER — Ambulatory Visit (INDEPENDENT_AMBULATORY_CARE_PROVIDER_SITE_OTHER): Payer: Medicare Other | Admitting: Obstetrics & Gynecology

## 2017-03-24 ENCOUNTER — Encounter: Payer: Self-pay | Admitting: Obstetrics & Gynecology

## 2017-03-24 VITALS — BP 146/82 | Ht 61.25 in | Wt 167.0 lb

## 2017-03-24 DIAGNOSIS — R87620 Atypical squamous cells of undetermined significance on cytologic smear of vagina (ASC-US): Secondary | ICD-10-CM | POA: Diagnosis not present

## 2017-03-24 DIAGNOSIS — R87810 Cervical high risk human papillomavirus (HPV) DNA test positive: Secondary | ICD-10-CM | POA: Diagnosis not present

## 2017-03-24 DIAGNOSIS — Z1151 Encounter for screening for human papillomavirus (HPV): Secondary | ICD-10-CM

## 2017-03-24 DIAGNOSIS — Z1231 Encounter for screening mammogram for malignant neoplasm of breast: Secondary | ICD-10-CM

## 2017-03-24 DIAGNOSIS — N393 Stress incontinence (female) (male): Secondary | ICD-10-CM | POA: Diagnosis not present

## 2017-03-24 DIAGNOSIS — Z90710 Acquired absence of both cervix and uterus: Secondary | ICD-10-CM

## 2017-03-24 DIAGNOSIS — Z9189 Other specified personal risk factors, not elsewhere classified: Secondary | ICD-10-CM

## 2017-03-24 DIAGNOSIS — Z124 Encounter for screening for malignant neoplasm of cervix: Secondary | ICD-10-CM | POA: Diagnosis not present

## 2017-03-24 DIAGNOSIS — Z01411 Encounter for gynecological examination (general) (routine) with abnormal findings: Secondary | ICD-10-CM

## 2017-03-24 DIAGNOSIS — Z780 Asymptomatic menopausal state: Secondary | ICD-10-CM

## 2017-03-24 DIAGNOSIS — N870 Mild cervical dysplasia: Secondary | ICD-10-CM

## 2017-03-24 DIAGNOSIS — Z13820 Encounter for screening for osteoporosis: Secondary | ICD-10-CM

## 2017-03-24 NOTE — Patient Instructions (Signed)
1. Encounter for gynecological examination with abnormal finding Gynecologic exam status post hysterectomy.  Pap test with high risk HPV done.  Breast exam normal.  Schedule screening mammogram.  Will schedule screening colonoscopy in 2019.  Health labs with family physician.  2. Menopause present Well on no hormone replacement therapy.  3. H/O total hysterectomy   4. Dysplasia of cervix, low grade (CIN 1) Colposcopy June 2018 showing koilocytosis.  High risk HPV positive.  HPV 16-18-40 5 negative.  Pap with high risk HPV done today.  Further management per results.  5. Urinary, incontinence, stress female No cystocele present on exam today.  Mild stress urinary incontinence with coughing.  Recommended to treat cough, constipation and avoid heavy lifting.  Will start Kegle exercises.  Instructions given and will add written information in summary today.  Patient will schedule reevaluation if worsening symptoms.  6. Screening for osteoporosis Recommend vitamin D supplements, calcium rich nutrition and regular weightbearing physical activity.  Follow-up here for bone density.   Ejercicios de Kegel Barrister's clerk) Los ejercicios de Kegel ayudan a fortalecer los msculos que sostienen el recto, la vagina, el intestino delgado, la vejiga y [place] . Los ejercicios de Kegel pueden ayudar a lo siguiente:  Mejorar el control de la vejiga y de los intestinos.  Mejorar la respuesta sexual.  Reducir los problemas o las molestias durante el [place]. Los ejercicios de Kegel implican apretar los msculos del suelo plvico, que son los mismos msculos que comprime cuando trata de Scientist, water quality el flujo de la [place]. Los ejercicios se pueden Regulatory affairs officer est sentado, parado o [place], pero lo mejor es variar la posicin. EJERCICIOS DE KEGEL 1. Apriete bien los msculos del suelo plvico. Debera sentir una elevacin firme en el rea del recto. Si es [place], tambin debera sentir una compresin en el  rea de la vagina. [place], las nalgas y las piernas relajadas. 2. Mantenga los msculos apretados durante 10segundos como mximo. 3. Relaje los msculos. Repita este ejercicio 50veces al da o como se lo haya indicado el mdico. Contine haciendo este ejercicio durante al menos 4 a 6semanas y Doctor, general practice tiempo que le haya indicado el mdico. Esta informacin no tiene Marine scientist el consejo del mdico. Asegrese de hacerle al mdico cualquier pregunta que tenga. Document Released: 02/29/2012 Document Revised: 02/01/2015 Document Reviewed: 02/01/2015 Elsevier Interactive Patient Education  2018 [place] mujeres posmenopusicas (Health Maintenance for Postmenopausal Women) La menopausia es un proceso normal en el cual se pierde la capacidad reproductiva. Este proceso ocurre gradualmente a lo largo de un perodo de meses o aos, por lo general entre los 16 y los 55aos. La menopausia es completa cuando no se han tenido 47mnstruaciones consecutivas. Es importante hablar con el mdico sobre algunas de las enfermedades ms comunes que afectan a las mujeres posmenopusicas, como la cardiopata coronaria, el cncer y la prdida de la masa sea (osteoporosis). Adoptar un estilo de vida saludable y recibir atencin preventiva pueden ayudar a promover la salud y eMusician Adems, estas medidas pueden reducir las probabilidades de desarrollar algunas de estas enfermedades frecuentes. QU DEBO SABER ACERCA DE [place] Durante le mEdgerton puede tener una serie de sntomas, por ejemplo:  Calores repentinos moderados a graves.  Sudoracin nocturna.  Disminucin del deseo sexual.  Cambios en el estado de nimo.  Dolores de cN[place]  Cansancio.  Irritabilidad.  Problemas de memoria.  Insomnio. Tratar o no los cambios que ocurren en  la menopausia es una decisin personal que se toma con el mdico. QU DEBO SABER SOBRE  LOS TRATAMIENTOS DE REPOSICIN HORMONAL Y LOS SUPLEMENTOS? Los productos para la terapia hormonal son eficaces para tratar los sntomas que se asocian con la menopausia, como los calores repentinos y las sudoraciones nocturnas. La reposicin hormonal conlleva ciertos riesgos, especialmente a medida que una mujer envejece. Si est pensando en usar tratamientos con estrgeno o estrgeno con progesterona, analice los beneficios y los riesgos con el mdico. QU DEBO SABER [place]? A medida que una persona envejece, aumenta la probabilidad de tener cardiopata coronaria, infarto de miocardio e ictus. Esto puede deberse, en parte, a los cambios hormonales que atraviesa el cuerpo durante la menopausia. Estos cambios pueden afectar la forma en que el organismo procesa las [place], los triglicridos y el colesterol de su dieta. El infarto de miocardio y el ictus son emergencias mdicas. Hay muchas cosas que se pueden hacer para ayudar a prevenir la cardiopata coronaria y el ictus:  Debe controlar su presin arterial al menos cada uno o [place]. La hipertensin arterial causa enfermedades cardacas y [place] el riesgo de ictus.  Si tiene entre 80 y 79aos, consulte al mdico si debe tomar aspirina para prevenir un infarto de miocardio o un ictus.  No consuma ningn producto que contenga tabaco, lo que incluye cigarrillos, tabaco de Higher education careers adviser o Psychologist, sport and exercise. Si necesita ayuda para dejar de fumar, consulte al [employer].  Es importante seguir una dieta sana y Theatre manager un peso saludable. ? Asegrese de [employer] verduras, frutas, productos lcteos de bajo contenido de [place] y Advertising account planner. ? No consuma alimentos con alto contenido de grasas slidas, azcares agregados o sal (sodio).  Realice actividad fsica con regularidad. Esta es una de las cosas ms importantes que puede hacer por su salud. ? Intente realizar al menos 128mnutos de actividad fsica por  semana. El tipo de ejercicio que realice debe aumentar la frecuencia cardaca y hacerla sudar. Esto se conoce como ejercicio de iMal[place] ? Intente hacer ejercicios de elongacin por lo menos dos veces por semana. Agrguelos al plan de ejercicio de intensidad moderada.  Conozca sus cifras. Pdale al mdico que le controle el colesterol y el nivel sanguneo de glucosa. Siga hacindose anlisis de sAmerican Electric Powerse lo haya indicado el mdico. QU DEBO SABER SOBRE LAS PRUEBAS DE DETECCIN DEL CNCER? Hay varios tipos de cncer. Tome las siguientes medidas para reducir el riesgo y dActuaryformacin cancerosa lo antes posible. Cncer de mama  Practique la autoconciencia de la mama. ? Esto significa reconocer la apariencia normal de sus mamas y cmo las siente. ? Tambin significa realizar autoexmenes regulares de las mDelphos Informe a su mdico sobre cualquier cambio, sin importar cun pequeo sea.  Si es mayor de 40aos, visite a un mdico para qPublic librarianun examen de las mamas (exploracin clnica mamaria o ECM) todos los a[place] En funcin de sToysRus los antecedentes familiares y la historia cHamilton tal vez sea recomendable que tambin se haga una radiografa anual de las mamas ([place].  Si tiene antecedentes familiares de cncer de mama, hable con el mdico para someterse a un estudio gentico.  Si tiene alto riesgo de pChief Financial Officerde mama, hable con el mdico para hacerse a uPublic house manager(RM) y uLavinia Sharpstodos los aWorden  La evaluacin del gen del cncer de mama (BRCA) se recomienda a las mujeres que tengan familiares con tumores malignos relacionados  con el BRCA. Los resultados de la evaluacin determinarn la necesidad de recibir asesoramiento gentico y Building services engineer de deteccin del BRCA1 y el BRCA2. Los tumores malignos relacionados con el BRCA incluyen estos tipos: ? Mama. Este tipo se presenta en hombres o mujeres. ? Ovario. ? Trompas. A este tipo  tambin se lo llama cncer de trompa de [place]. ? Cncer de la pared abdominal o plvica (cncer de peritoneo). ? Prstata. ? Pncreas. Cncer de cuello uterino, de tero y de ovario El mdico puede recomendarle que se haga pruebas peridicas de deteccin de cncer de los rganos de la pelvis, los cuales [employer] ovarios, el tero y la vagina. Estas pruebas incluyen un examen plvico, que abarca controlar si se produjeron cambios microscpicos en la superficie del cuello del tero (prueba de Papanicolaou).  A las mujeres que [employer] 21 y 20aos, los mdicos pueden recomendarles que se realicen un examen plvico y Ardelia Mems prueba de Papanicolaou cada tres aos. A las mujeres que tienen entre 30 y 65aos, pueden recomendarles la prueba de Papanicolaou y el examen plvico, en combinacin con una prueba de deteccin del virus del papiloma humano (VPH) [place]. Algunos tipos de VPH aumentan el riesgo de Chief Financial Officer de cuello del tero. La prueba para la deteccin del VPH tambin puede realizarse a mujeres de cualquier edad cuyos resultados de la prueba de Papanicolaou no sean claros.  Es posible que otros mdicos no recomienden exmenes de deteccin a las mujeres no embarazadas que se consideran sujetos de bajo riesgo de Chief Financial Officer de pelvis y no tienen sntomas. Pregntele al mdico si un examen plvico de deteccin es adecuado para usted.  Si ha recibido un tratamiento para Science writer cervical o una enfermedad que podra causar cncer, necesitar realizarse una prueba de Papanicolaou y controles durante al menos 19 aos de concluido el [place]. Si no se ha hecho el Papanicolaou con regularidad, debern volver a evaluarse los factores de riesgo (como tener un nuevo compaero sexual), para Teacher, adult education si debe empezar a Dispensing optician los estudios nuevamente. Algunas mujeres sufren problemas mdicos que aumentan la probabilidad de Museum/gallery curator cncer de cuello del tero. En estos casos, el  mdico podr [employer] se realicen controles y pruebas de Papanicolaou con ms frecuencia.  Si tiene antecedentes familiares de cncer de tero o de ovario, hable con el mdico para someterse a un estudio gentico.  Si tiene hemorragia vaginal despus de la menopausia, informe al mdico.  En la actualidad, no hay pruebas confiables para la deteccin del cncer de ovario. Cncer de pulmn Se recomienda realizar exmenes de deteccin de cncer de pulmn a personas adultas entre 63 y 15 aos que estn en riesgo de Horticulturist, commercial de pulmn por sus antecedentes de consumo de tabaco. Se recomienda una tomografa computarizada (TC) de baja dosis de los pulmones todos los aos si usted:  Fuma actualmente.  Ha fumado durante 30aos un paquete diario y sigue fumando o dej el hbito en algn momento en los ltimos 15aos. Un paquete-ao equivale a fumar en promedio un paquete de cigarrillos diario durante un ao. Los exmenes de deteccin anuales:  Deben hacerse hasta que hayan pasado 15aos desde que dej de fumar.  Deben dejar de realizarse si tiene un problema de salud que le impide recibir tratamiento para el cncer de pulmn. Cncer colorrectal  Este tipo de cncer puede detectarse y a menudo prevenirse.  El estudio de [place] de Programme researcher, broadcasting/film/video del cncer colorrectal debe comenzar a Electrical engineer a Proofreader de los  32aos y [facility] [phone].  El mdico puede aconsejarle que lo haga antes, si tiene factores de riesgo de Best boy cncer de colon.  Si tiene antecedentes familiares de cncer colorrectal, hable con el mdico para someterse a un estudio gentico.  El mdico tambin puede recomendarle que use un kit de prueba para Engineer, mining a fin de Educational psychologist en la materia fecal.  Es posible que se use una pequea cmara en el extremo de un tubo para examinar directamente el colon (sigmoidoscopia o colonoscopia) a fin de Hydrographic surveyor formas tempranas de cncer colorrectal.  El examen directo  del colon se debe repetir cada 5 a 10aos hasta los 57aos. Sin embargo, si se hallan formas incipientes de plipos precancerosos o pequeos tumores, o si tiene antecedentes familiares o riesgo gentico de Therapist, music, debe realizarse exmenes de deteccin con ms frecuencia. Cncer de piel  Revise la piel de la cabeza a los pies con regularidad.  Contrlese los lunares. Infrmele al mdico: ? Si aparecen nuevos lunares o los que tiene se modifican, especialmente en su forma o color. ? Si tiene un lunar que es ms grande que el tamao de una goma de Games developer.  Si alguno de los miembros de su familia tiene antecedentes de cncer de piel, especialmente a una edad temprana, hable con el mdico para someterse a pruebas genticas.  Siempre use pantalla solar. Aplique pantalla solar de Kerry Dory y repetida a lo largo del Training and development officer.  Protjase usando mangas y [employer], un sombrero de ala ancha y gafas para el sol, siempre que est al [place]. QU DEBO SABER SOBRE LA OSTEOPOROSIS? La osteoporosis es una afeccin en la cual la destruccin de la masa sea ocurre con mayor rapidez que su formacin. Despus de la menopausia, puede correr un riesgo ms alto de tener osteoporosis. Para ayudar a prevenir esta afeccin o las fracturas seas que pueden ocurrir a causa de [place], se recomienda lo siguiente:  Si tiene entre 19 y 50aos, tome como mnimo 1048m de calcio y 6065mde vitaminaD por daTraining and development officer Si es mayor de 50aos pero menor de 70aos, tome como mnimo 120074me calcio y 600m30m vitaminaD por da. Training and development officeri es mayor de 70aos, tome como mnimo 1200mg65mcalcio y 800mg 27mitaminaD por da. ElTraining and development officerabaquismo y el consumo excesivo de alcohol aumentan el riesgo de osteoporosis. Consuma alimentos ricos en calcio y vitaminaD, y haga ejercicios con soporte de peso varias veces a la semana, como se lo haya indicado el mdico. QU DEBO SABER SOBRE EL MODO EN QUE LA MENOPAUSIA AFECTA MI  SALUD MENTAL? La depresin puede presentarse a cualquier edad, pero es ms frecuente a medida que una persona envejece. Los sntomas comunes de depresin incluyen lo siguiente:  Desnimo o tristeza.  Cambios en los patrones de sueo.  Cambios en el apetito o en los hbitos de alimentacin.  Sensacin de falta general de motivacin o placer al realizYahooidades que sola disfrutar.  Crisis frecuentes de llanto. Hable con el mdico si cree que tiene depresin. QU DEBO SABER SOBRE LAS VACUNAS? Es importante que se aplique las vacunas y las maPort Angeles Easts incluyen las siguientes:  Vacuna contra el ttanos, la difteria y la tosferina (Tdap).  Vacuna anual contra la gripe antes del inicio de la temporada de gripe.  Vacuna contra la neumona.  Vacuna contra el herpes. El mdico tambin puede recomendarle que se aplique otras vacunas. Esta informacin no tiene como fin  reemplazar el consejo del mdico. Asegrese de hacerle al mdico cualquier pregunta que tenga. Document Released: 01/02/2013 Document Revised: 04/04/2014 Document Reviewed: 12/16/2014 Elsevier Interactive Patient Education  2018 [employer].

## 2017-03-24 NOTE — Addendum Note (Signed)
Addended by: Thurnell Garbe A on: 03/24/2017 08:56 AM   Modules accepted: Orders

## 2017-03-24 NOTE — Progress Notes (Addendum)
Debra Simon 1950/09/09 [id number]   History:    66 y.o. [id number]  Separated.  RP:  Established patient presenting for annual gyn exam   HPI: Separated, not sexually active.  Menopausal, well without hormone replacement therapy.  Status post hysterectomy.  No pelvic pain.  Not using the estrogen cream prescribed last year.  No vaginal or vulvar dryness, prefers not to use estrogen cream.  Complains of mild urinary stress incontinence with coughing. Not doing Kegel exercises.  Breasts normal.  Due for mammogram.  Occasional physical activity.  Not taking vitamin D regularly.  Due for bone density.  Body mass index 31.30.  Past medical history,surgical history, family history and social history were all reviewed and documented in the EPIC chart.  Gynecologic History No LMP recorded. Patient has had a hysterectomy. Contraception: status post hysterectomy Last Pap: 07/2015. Results were: LGSIL/HPV HR pos.  Colpo 08/2016 Koilocytosis.  HPV 16-18-45 neg. Last mammogram: 12/2015. Results were: Negative Colono 2016, recommended repeat at 3 yrs. Bone Density 11/2014 normal.  Will schedule now.  Obstetric History OB History  Gravida Para Term Preterm AB Living  7 4     3 4   SAB TAB Ectopic Multiple Live Births  3            # Outcome Date GA Lbr Len/2nd Weight Sex Delivery Anes PTL Lv  7 SAB           6 SAB           5 SAB           4 Para           3 Para           2 Para           1 Para                ROS: A ROS was performed and pertinent positives and negatives are included in the history.  GENERAL: No fevers or chills. HEENT: No change in vision, no earache, sore throat or sinus congestion. NECK: No pain or stiffness. CARDIOVASCULAR: No chest pain or pressure. No palpitations. PULMONARY: No shortness of breath, cough or wheeze. GASTROINTESTINAL: No abdominal pain, nausea, vomiting or diarrhea, melena or bright red blood per rectum. GENITOURINARY: No urinary frequency, urgency,  hesitancy or dysuria. MUSCULOSKELETAL: No joint or muscle pain, no back pain, no recent trauma. DERMATOLOGIC: No rash, no itching, no lesions. ENDOCRINE: No polyuria, polydipsia, no heat or cold intolerance. No recent change in weight. HEMATOLOGICAL: No anemia or easy bruising or bleeding. NEUROLOGIC: No headache, seizures, numbness, tingling or weakness. PSYCHIATRIC: No depression, no loss of interest in normal activity or change in sleep pattern.     Exam:   BP (!) 146/82   Ht 5' 1.25" (1.556 m)   Wt 167 lb (75.8 kg)   BMI 31.30 kg/m   Body mass index is 31.3 kg/m.  General appearance : Well developed well nourished female. No acute distress HEENT: Eyes: no retinal hemorrhage or exudates,  Neck supple, trachea midline, no carotid bruits, no thyroidmegaly Lungs: Clear to auscultation, no rhonchi or wheezes, or rib retractions  Heart: Regular rate and rhythm, no murmurs or gallops Breast:Examined in sitting and supine position were symmetrical in appearance, no palpable masses or tenderness,  no skin retraction, no nipple inversion, no nipple discharge, no skin discoloration, no axillary or supraclavicular lymphadenopathy Abdomen: no palpable masses or tenderness, no rebound or guarding Extremities: no edema or  skin discoloration or tenderness  Pelvic: Vulva normal  Bartholin, Urethra, Skene Glands: Within normal limits             Vagina: No gross lesions or discharge.  Pap/HR HPV done.  Cervix/Uterus absent  Adnexa  Without masses or tenderness  Anus and perineum  normal     Assessment/Plan:  66 y.o. female for annual exam   1. Encounter for gynecological examination with abnormal finding Gynecologic exam status post hysterectomy.  Pap test with high risk HPV done.  Breast exam normal.  Schedule screening mammogram.  Will schedule screening colonoscopy in 2019.  Health labs with family physician.  2. Menopause present Well on no hormone replacement therapy.  3. H/O total  hysterectomy   4. Dysplasia of vagina (VAIN 1) Colposcopy June 2018 showing koilocytosis.  High risk HPV positive.  HPV 16-18-45 negative.  Pap with high risk HPV done today.  Further management per results.  5. Urinary, incontinence, stress female No cystocele present on exam today.  Mild stress urinary incontinence with coughing.  Recommended to treat cough, constipation and avoid heavy lifting.  Will start Kegle exercises.  Instructions given and will add written information in summary today.  Patient will schedule reevaluation if worsening symptoms.  6. Screening for osteoporosis Recommend vitamin D supplements, calcium rich nutrition and regular weightbearing physical activity.  Follow-up here for bone density.  Counseling on above issues more than 50% for 15 minutes. - DG Bone Density; Future  Princess Bruins MD, 8:12 AM 03/24/2017

## 2017-03-29 LAB — PAP, TP IMAGING W/ HPV RNA, RFLX HPV TYPE 16,18/45: HPV DNA High Risk: DETECTED — AB

## 2017-03-30 ENCOUNTER — Other Ambulatory Visit: Payer: Self-pay | Admitting: Gynecology

## 2017-03-30 DIAGNOSIS — Z780 Asymptomatic menopausal state: Secondary | ICD-10-CM

## 2017-04-03 ENCOUNTER — Other Ambulatory Visit: Payer: Managed Care, Other (non HMO)

## 2017-04-17 ENCOUNTER — Ambulatory Visit
Admission: RE | Admit: 2017-04-17 | Discharge: 2017-04-17 | Disposition: A | Discharge status: Home / Self Care | Payer: Medicare Other | Source: Ambulatory Visit | Attending: Obstetrics & Gynecology | Admitting: Obstetrics & Gynecology

## 2017-04-17 DIAGNOSIS — Z1231 Encounter for screening mammogram for malignant neoplasm of breast: Secondary | ICD-10-CM

## 2017-04-18 ENCOUNTER — Encounter: Payer: Self-pay | Admitting: Gynecology

## 2017-04-18 ENCOUNTER — Ambulatory Visit (INDEPENDENT_AMBULATORY_CARE_PROVIDER_SITE_OTHER): Payer: Medicare Other

## 2017-04-18 ENCOUNTER — Other Ambulatory Visit: Payer: Self-pay | Admitting: Obstetrics & Gynecology

## 2017-04-18 DIAGNOSIS — R928 Other abnormal and inconclusive findings on diagnostic imaging of breast: Secondary | ICD-10-CM

## 2017-04-18 DIAGNOSIS — Z780 Asymptomatic menopausal state: Secondary | ICD-10-CM

## 2017-04-24 ENCOUNTER — Ambulatory Visit
Admission: RE | Admit: 2017-04-24 | Discharge: 2017-04-24 | Disposition: A | Discharge status: Home / Self Care | Payer: Managed Care, Other (non HMO) | Source: Ambulatory Visit | Attending: Obstetrics & Gynecology | Admitting: Obstetrics & Gynecology

## 2017-04-24 ENCOUNTER — Ambulatory Visit: Payer: Managed Care, Other (non HMO)

## 2017-04-24 DIAGNOSIS — R928 Other abnormal and inconclusive findings on diagnostic imaging of breast: Secondary | ICD-10-CM

## 2017-04-24 DIAGNOSIS — R922 Inconclusive mammogram: Secondary | ICD-10-CM | POA: Diagnosis not present

## 2017-04-26 ENCOUNTER — Encounter: Payer: Self-pay | Admitting: Obstetrics & Gynecology

## 2017-04-26 ENCOUNTER — Ambulatory Visit (INDEPENDENT_AMBULATORY_CARE_PROVIDER_SITE_OTHER): Payer: Medicare Other | Admitting: Obstetrics & Gynecology

## 2017-04-26 VITALS — BP 132/80

## 2017-04-26 DIAGNOSIS — R87620 Atypical squamous cells of undetermined significance on cytologic smear of vagina (ASC-US): Secondary | ICD-10-CM | POA: Diagnosis not present

## 2017-04-26 DIAGNOSIS — N893 Dysplasia of vagina, unspecified: Secondary | ICD-10-CM | POA: Diagnosis not present

## 2017-04-26 DIAGNOSIS — R87811 Vaginal high risk human papillomavirus (HPV) DNA test positive: Secondary | ICD-10-CM | POA: Diagnosis not present

## 2017-04-26 DIAGNOSIS — N890 Mild vaginal dysplasia: Secondary | ICD-10-CM | POA: Diagnosis not present

## 2017-04-26 NOTE — Progress Notes (Signed)
    Debra Simon 1950/10/18 [id number]        67 y.o.  [id number] Separated.  Abstinent x 10 years  RP: ASCUS/HPV HR positive  HPI: S/P Total Hysterectomy.  H/O VAIN.  HR HPV positive as far back as 2009.  Last Colpo 08/2016: Diagnosis Vagina, biopsy, 6:00 o'clock: FOCAL KOILOCYTIC ATYPIA.  Previous HPV 16-18-45 testing negative.  Abstinent for the past 10 years.   OB History  Gravida Para Term Preterm AB Living  7 4     3 4   SAB TAB Ectopic Multiple Live Births  3            # Outcome Date GA Lbr Len/2nd Weight Sex Delivery Anes PTL Lv  7 SAB           6 SAB           5 SAB           4 Para           3 Para           2 Para           1 Para               Past medical history,surgical history, problem list, medications, allergies, family history and social history were all reviewed and documented in the EPIC chart.   Directed ROS with pertinent positives and negatives documented in the history of present illness/assessment and plan.  Exam:  Vitals:   04/26/17 1128  BP: 132/80   General appearance:  Normal  Colposcopy Procedure Note Queena Wieand 04/26/2017  Indications:  ASCUS/HPV HR pos  Procedure Details  The risks and benefits of the procedure and Verbal informed consent obtained.  Speculum placed in vagina and excellent visualization of cervix achieved, cervix swabbed x 3 with acetic acid solution.  Findings:  Cervix colposcopy:  Cervix absent  Vaginal colposcopy:  Aw with punctation at mid vaginal cuff and left fornix.  Exophytic 1 x 1 cm lesion at right upper 1/3 vagina.  A biopsy was taken at the 3 sites just described.  Vulvar colposcopy:  Grossly normal  Perirectal colposcopy: Grossly normal  Specimens:  Vaginal Biopsies:  Mid vaginal vault, left fornix and right superior 1/3 vagina (exophytic lesion)  Complications:  None, hemostasis with Silver Nitrate . Plan:  Management per Vaginal biopsy results, pending.    Assessment/Plan:  67 y.o.  [id number]   1. ASCUS with positive high risk human papillomavirus of vagina Vaginal colposcopy done today.  3 biopsies taken.  Pending results.  Possible VAIN 1.  Management per results.  Previous HPV 16, 18 and 45 negative, not repeated as patient has been abstinent since then.  2. VAIN (vaginal intraepithelial neoplasia) History of VAIN 1.  Counseling on above issues more than 50% for 15 minutes.  Princess Bruins MD, 11:39 AM 04/26/2017

## 2017-04-26 NOTE — Patient Instructions (Signed)
1. ASCUS with positive high risk human papillomavirus of vagina Vaginal colposcopy done today.  3 biopsies taken.  Pending results.  Possible VAIN 1.  Management per results.  Previous HPV 16, 18 and 45 negative, not repeated as patient has been abstinent since then.  2. VAIN (vaginal intraepithelial neoplasia) History of VAIN 1.  Lizania, fue un placer verle hoy!  Voy a informarle de sus [employer].  Colposcopa - Cuidados posteriores (Colposcopy, Care After) Siga estas instrucciones durante las prximas semanas. Estas indicaciones le proporcionan informacin general acerca de cmo deber cuidarse despus del procedimiento. El mdico tambin podr darle instrucciones ms especficas. El tratamiento se ha planificado de acuerdo a las prcticas mdicas actuales, pero a veces se producen problemas. Comunquese con el mdico si tiene algn problema o tiene dudas despus del procedimiento. QU ESPERAR DESPUS DEL PROCEDIMIENTO  Despus del procedimiento, es tpico tener las siguientes sensaciones:  Clicos. Generalmente se calman en algunos minutos.  Dolor. [place].  Aturdimiento. Si esto le ocurre, recustese durante algunos minutos. Podr tener un sangrado leve o una secrecin oscura que debe detenerse en [place]. Durante este tiempo deber usar un apsito sanitario. [place] vaginales y el uso de tampones durante 3 das, o segn lo que le indique su mdico.  Tome slo medicamentos de venta libre o recetados, segn las indicaciones del mdico. No tome aspirina, ya que puede causar hemorragias.  Si utiliza pldoras anticonceptivas, contine tomndolas.  No todos los resultados estarn disponibles durante su visita. En este caso, tenga otra entrevista con su mdico para conocerlos. No suponga que es normal si no tiene noticias de su mdico o del establecimiento de salud. Es  Building services engineer seguimiento de todos los [place].  Siga los consejos de su mdico con respecto a los [place], [place], visitas y [place] de control. SOLICITE ATENCIN MDICA SI:  Aparece una erupcin cutnea.  Tiene problemas con los medicamentos. SOLICITE ATENCIN MDICA DE INMEDIATO SI:  Tiene una hemorragia abundante o elimina cogulos.  Tiene fiebre.  Tiene flujo vaginal anormal.  Tiene clicos que no se alivian luego de tomar analgsicos.  Se siente mareada, tiene vahdos o se desmaya.  Siente Research scientist (life sciences). Esta informacin no tiene Marine scientist el consejo del mdico. Asegrese de hacerle al mdico cualquier pregunta que tenga. Document Released: 01/02/2013 Elsevier Interactive Patient Education  2017 [employer].

## 2017-04-28 ENCOUNTER — Encounter: Payer: Self-pay | Admitting: Obstetrics & Gynecology

## 2017-04-28 LAB — PATHOLOGY

## 2017-04-28 LAB — TISSUE PATH REPORT 10802

## 2017-05-08 DIAGNOSIS — M1711 Unilateral primary osteoarthritis, right knee: Secondary | ICD-10-CM | POA: Diagnosis not present

## 2017-05-12 ENCOUNTER — Ambulatory Visit (INDEPENDENT_AMBULATORY_CARE_PROVIDER_SITE_OTHER): Payer: Medicare Other | Admitting: Family Medicine

## 2017-05-12 VITALS — BP 130/80 | HR 69 | Temp 98.6°F | Wt 166.2 lb

## 2017-05-12 DIAGNOSIS — N3001 Acute cystitis with hematuria: Secondary | ICD-10-CM

## 2017-05-12 DIAGNOSIS — E786 Lipoprotein deficiency: Secondary | ICD-10-CM

## 2017-05-12 DIAGNOSIS — K5730 Diverticulosis of large intestine without perforation or abscess without bleeding: Secondary | ICD-10-CM

## 2017-05-12 DIAGNOSIS — I10 Essential (primary) hypertension: Secondary | ICD-10-CM | POA: Diagnosis not present

## 2017-05-12 DIAGNOSIS — E785 Hyperlipidemia, unspecified: Secondary | ICD-10-CM

## 2017-05-12 DIAGNOSIS — R350 Frequency of micturition: Secondary | ICD-10-CM

## 2017-05-12 LAB — URINALYSIS, ROUTINE W REFLEX MICROSCOPIC
Bilirubin Urine: NEGATIVE
Ketones, ur: NEGATIVE
Nitrite: NEGATIVE
Specific Gravity, Urine: 1.01 (ref 1.000–1.030)
Total Protein, Urine: NEGATIVE
Urine Glucose: NEGATIVE
Urobilinogen, UA: 0.2 (ref 0.0–1.0)
pH: 7.5 (ref 5.0–8.0)

## 2017-05-12 LAB — CBC WITH DIFFERENTIAL/PLATELET
Basophils Absolute: 0 10*3/uL (ref 0.0–0.1)
Basophils Relative: 0.2 % (ref 0.0–3.0)
Eosinophils Absolute: 0.2 10*3/uL (ref 0.0–0.7)
Eosinophils Relative: 2.9 % (ref 0.0–5.0)
HCT: 40.6 % (ref 36.0–46.0)
Hemoglobin: 13.5 g/dL (ref 12.0–15.0)
Lymphocytes Relative: 31.6 % (ref 12.0–46.0)
Lymphs Abs: 1.9 10*3/uL (ref 0.7–4.0)
MCHC: 33.2 g/dL (ref 30.0–36.0)
MCV: 89 fl (ref 78.0–100.0)
Monocytes Absolute: 0.4 10*3/uL (ref 0.1–1.0)
Monocytes Relative: 6.3 % (ref 3.0–12.0)
Neutro Abs: 3.5 10*3/uL (ref 1.4–7.7)
Neutrophils Relative %: 59 % (ref 43.0–77.0)
Platelets: 234 10*3/uL (ref 150.0–400.0)
RBC: 4.56 Mil/uL (ref 3.87–5.11)
RDW: 13.7 % (ref 11.5–15.5)
WBC: 6 10*3/uL (ref 4.0–10.5)

## 2017-05-12 LAB — COMPREHENSIVE METABOLIC PANEL
ALT: 13 U/L (ref 0–35)
AST: 16 U/L (ref 0–37)
Albumin: 4.6 g/dL (ref 3.5–5.2)
Alkaline Phosphatase: 66 U/L (ref 39–117)
BUN: 15 mg/dL (ref 6–23)
CO2: 33 mEq/L — ABNORMAL HIGH (ref 19–32)
Calcium: 9.5 mg/dL (ref 8.4–10.5)
Chloride: 101 mEq/L (ref 96–112)
Creatinine, Ser: 0.6 mg/dL (ref 0.40–1.20)
GFR: 106.47 mL/min (ref >60.00)
Glucose, Bld: 102 mg/dL — ABNORMAL HIGH (ref 70–99)
Potassium: 3.7 mEq/L (ref 3.5–5.1)
Sodium: 141 mEq/L (ref 135–145)
Total Bilirubin: 0.7 mg/dL (ref 0.2–1.2)
Total Protein: 7.4 g/dL (ref 6.0–8.3)

## 2017-05-12 MED ORDER — SIMVASTATIN 20 MG PO TABS: 20.0000 mg | ORAL_TABLET | Freq: Every day | ORAL | 3 refills | Status: DC

## 2017-05-12 NOTE — Progress Notes (Signed)
Subjective:    Debra Simon is a 67 y.o. female who presents to the office today for a preoperative consultation at the request of her Orthopedist who plans on performing knee replacement in March. This consultation is requested for the specific conditions prompting preoperative evaluation (i.e. because of potential affect on operative risk): HTN, CHF, Hx of lacunar infarction. Patient has a bleeding risk of: no recent abnormal bleeding, no remote history of abnormal bleeding and no use of Ca-channel blockers. Patient does not have objections to receiving blood products if needed.  Patient Active Problem List   Diagnosis Date Noted  . Diverticulosis   . Hematuria 03/14/2017  . Arthritis of right knee 11/16/2016  . History of vaginal dysplasia 08/31/2016  . Mild mitral regurgitation 07/04/2016  . Diastolic dysfunction, left ventricle 06/26/2016  . Benign paroxysmal vertigo 06/02/2016  . Lacunar infarction 06/02/2016  . Hyperlipidemia with low HDL 06/02/2016  . Vaginal dysplasia 08/28/2015  . Sensorineural hearing loss (SNHL), bilateral 07/07/2015  . Tinnitus of both ears 07/07/2015  . Rectocele 05/30/2014  . Obesity 07/31/2013  . HTN (hypertension) 07/31/2013  . Vitamin D deficiency 07/31/2013  . Hyperglycemia 07/31/2013  . Vaginal atrophy 05/24/2013  . Personal history of colonic polyps 05/24/2013   Past Surgical History:  Procedure Laterality Date  . BREAST CYST ASPIRATION Right   . BREAST EXCISIONAL BIOPSY Right   . COLONOSCOPY  2010  . COLONOSCOPY  2012  . CYSTOSCOPY    . ESOPHAGOGASTRODUODENOSCOPY  2010  . TOTAL ABDOMINAL HYSTERECTOMY  1994  . URETERAL EXPLORATION  04/16/1992   URETEROTOMY ON THE RIGHT, REPAIR LACERATION ON THE LEFT   Social History   Socioeconomic History  . Marital status: Married    Spouse name: Not on file  . Number of children: Not on file  . Years of education: Not on file  . Highest education level: GED or equivalent  Social Needs  .  Financial resource strain: Not on file  . Food insecurity - worry: Never true  . Food insecurity - inability: Never true  . Transportation needs - medical: No  . Transportation needs - non-medical: No  Occupational History  . Occupation: Housewife  Tobacco Use  . Smoking status: Never Smoker  . Smokeless tobacco: Never Used  Substance and Sexual Activity  . Alcohol use: No    Alcohol/week: 0.0 oz    Comment: rare on holidays  . Drug use: No  . Sexual activity: No  Other Topics Concern  . Not on file  Social History Narrative  . Not on file   The following portions of the patient's history were reviewed and updated as appropriate: allergies, current medications, past family history, past medical history, past social history, past surgical history and problem list.  Review of Systems Pertinent items noted in HPI and remainder of comprehensive ROS otherwise negative.    Objective:   Physical Exam BP 130/80   Pulse 69   Temp 98.6 F (37 C) (Oral)   Wt 166 lb 3.2 oz (75.4 kg)   SpO2 98%   BMI 31.15 kg/m   General Appearance:    Alert, cooperative, no distress, appears stated age  Head:    Normocephalic, without obvious abnormality, atraumatic  Eyes:    PERRL, conjunctiva/corneas clear, EOM's intact, fundi    benign, both eyes  Ears:    Normal TM's and external ear canals, both ears  Nose:   Nares normal, septum midline, mucosa normal, no drainage    or  sinus tenderness  Throat:   Lips, mucosa, and tongue normal; teeth and gums normal  Neck:   Supple, symmetrical, trachea midline, no adenopathy;    thyroid:  no enlargement/tenderness/nodules; no carotid   bruit or JVD  Back:     Symmetric, no curvature, ROM normal, no CVA tenderness  Lungs:     Clear to auscultation bilaterally, respirations unlabored  Chest Wall:    No tenderness or deformity   Heart:    Regular rate and rhythm, S1 and S2 normal, no murmur, rub   or gallop  Abdomen:     Soft, non-tender, bowel sounds  active all four quadrants,    no masses, no organomegaly  Extremities:   Extremities normal, atraumatic, no cyanosis or edema  Pulses:   2+ and symmetric all extremities  Skin:   Skin color, texture, turgor normal, no rashes or lesions  Lymph nodes:   Cervical, supraclavicular, and axillary nodes normal  Neurologic:   CNII-XII intact, normal strength, sensation and reflexes    throughout   Cardiographics ECG: normal sinus rhythm, no blocks or conduction defects, no ischemic changes Echocardiogram: 2018 ECHO reviewed  Imaging Chest x-ray: not indicated   Lab Review   Results for orders placed or performed in visit on 05/12/17  Comprehensive metabolic panel  Result Value Ref Range   Sodium 141 135 - 145 mEq/L   Potassium 3.7 3.5 - 5.1 mEq/L   Chloride 101 96 - 112 mEq/L   CO2 33 (H) 19 - 32 mEq/L   Glucose, Bld 102 (H) 70 - 99 mg/dL   BUN 15 6 - 23 mg/dL   Creatinine, Ser 0.60 0.40 - 1.20 mg/dL   Total Bilirubin 0.7 0.2 - 1.2 mg/dL   Alkaline Phosphatase 66 39 - 117 U/L   AST 16 0 - 37 U/L   ALT 13 0 - 35 U/L   Total Protein 7.4 6.0 - 8.3 g/dL   Albumin 4.6 3.5 - 5.2 g/dL   Calcium 9.5 8.4 - 10.5 mg/dL   GFR 106.47 >60.00 mL/min  CBC with Differential/Platelet  Result Value Ref Range   WBC 6.0 4.0 - 10.5 K/uL   RBC 4.56 3.87 - 5.11 Mil/uL   Hemoglobin 13.5 12.0 - 15.0 g/dL   HCT 40.6 36.0 - 46.0 %   MCV 89.0 78.0 - 100.0 fl   MCHC 33.2 30.0 - 36.0 g/dL   RDW 13.7 11.5 - 15.5 %   Platelets 234.0 150.0 - 400.0 K/uL   Neutrophils Relative % 59.0 43.0 - 77.0 %   Lymphocytes Relative 31.6 12.0 - 46.0 %   Monocytes Relative 6.3 3.0 - 12.0 %   Eosinophils Relative 2.9 0.0 - 5.0 %   Basophils Relative 0.2 0.0 - 3.0 %   Neutro Abs 3.5 1.4 - 7.7 K/uL   Lymphs Abs 1.9 0.7 - 4.0 K/uL   Monocytes Absolute 0.4 0.1 - 1.0 K/uL   Eosinophils Absolute 0.2 0.0 - 0.7 K/uL   Basophils Absolute 0.0 0.0 - 0.1 K/uL  Urinalysis, Routine w reflex microscopic  Result Value Ref Range    Color, Urine YELLOW Yellow;Lt. Yellow   APPearance CLEAR Clear   Specific Gravity, Urine 1.010 1.000 - 1.030   pH 7.5 5.0 - 8.0   Total Protein, Urine NEGATIVE Negative   Urine Glucose NEGATIVE Negative   Ketones, ur NEGATIVE Negative   Bilirubin Urine NEGATIVE Negative   Hgb urine dipstick SMALL (A) Negative   Urobilinogen, UA 0.2 0.0 - 1.0   Leukocytes,  UA SMALL (A) Negative   Nitrite NEGATIVE Negative   WBC, UA 3-6/hpf (A) 0-2/hpf   RBC / HPF 0-2/hpf 0-2/hpf   Squamous Epithelial / LPF Rare(0-4/hpf) Rare(0-4/hpf)   Bacteria, UA Few(10-50/hpf) (A) None    Assessment:   67 y.o. female with planned surgery as above.    Cardiac Risk Estimation: per the Revised Cardiac Risk Index, the patient's risk factors for cardiac complications include history of cerebrovascular disease and history of congestive heart failure, putting her in: RCI RISK CLASS II (1 risk factor, risk of major cardiac compl. appr. 1.3%)  Cerebrovascular Disease 1  Congestive Heart Failure 1  Creatinine > 2 1  Diabetes Requiring Insulin 1  Ischemic Cardiac Disease   Suprainguinal Vascular Surgery, Intrathoracic Surgery, Intra-abdominal Surgery (High Risk Surgery) 1    Plan:   1. Preoperative workup as follows as above. 2. Medically maximized for surgery.  Briscoe Deutscher

## 2017-05-14 ENCOUNTER — Encounter: Payer: Self-pay | Admitting: Family Medicine

## 2017-05-14 DIAGNOSIS — K5790 Diverticulosis of intestine, part unspecified, without perforation or abscess without bleeding: Secondary | ICD-10-CM | POA: Insufficient documentation

## 2017-05-14 LAB — URINE CULTURE
MICRO NUMBER:: 90205371
SPECIMEN QUALITY:: ADEQUATE

## 2017-05-15 MED ORDER — NITROFURANTOIN MONOHYD MACRO 100 MG PO CAPS: 100.0000 mg | ORAL_CAPSULE | Freq: Two times a day (BID) | ORAL | 0 refills | Status: DC

## 2017-05-24 ENCOUNTER — Encounter (HOSPITAL_COMMUNITY): Payer: Self-pay | Admitting: Physician Assistant

## 2017-05-24 DIAGNOSIS — M1711 Unilateral primary osteoarthritis, right knee: Secondary | ICD-10-CM | POA: Diagnosis not present

## 2017-05-24 HISTORY — DX: Unilateral primary osteoarthritis, right knee: M17.11

## 2017-05-24 NOTE — Pre-Procedure Instructions (Signed)
Debra Simon  05/24/2017      [id number] - [place], [address] [place] [address] Phone: [phone] Fax: [phone]    Your procedure is scheduled on March 11th, Monday.   Report to [facility] Admitting at  5:30 AM             (posted surgery time 7:15a - 9:30a)   Call this number if you have problems the morning of surgery:  [phone]   Remember:              4-5 days prior to surgery, STOP TAKING any Vitamins, Herbal Supplements, Anti-inflammatories.   Do not eat food or drink liquids after midnight, Sunday.   Take these medicines the morning of surgery with A SIP OF WATER : NONE   Do not wear jewelry, make-up or nail polish.  Do not wear lotions, powders,  perfumes, or deodorant.  Do not shave 48 hours prior to surgery.    Do not bring valuables to the hospital.  [facility] is not responsible for any belongings or valuables.  Contacts, dentures or bridgework may not be worn into surgery.  Leave your suitcase in the car.  After surgery it may be brought to your room.  For patients admitted to the hospital, discharge time will be determined by your treatment team.  Please read over the following fact sheets that you were given. Pain Booklet, MRSA Information and Surgical Site Infection Prevention      [place]- Preparing For Surgery  Before surgery, you can play an important role. Because skin is not sterile, your skin needs to be as free of germs as possible. You can reduce the number of germs on your skin by washing with CHG (chlorahexidine gluconate) Soap before surgery.  CHG is an antiseptic cleaner which kills germs and bonds with the skin to continue killing germs even after washing.  Please do not use if you have an allergy to CHG or antibacterial soaps. If your skin becomes reddened/irritated stop using the CHG.  Do not shave (including legs and underarms) for at least 48 hours prior to first CHG  shower. It is OK to shave your face.  Please follow these instructions carefully.   1. Shower the NIGHT BEFORE SURGERY and the MORNING OF SURGERY with CHG.   2. If you chose to wash your hair, wash your hair first as usual with your normal shampoo.  3. After you shampoo, rinse your hair and body thoroughly to remove the shampoo.  4. Use CHG as you would any other liquid soap. You can apply CHG directly to the skin and wash gently with a scrungie or a clean washcloth.   5. Apply the CHG Soap to your body ONLY FROM THE NECK DOWN.  Do not use on open wounds or open sores. Avoid contact with your eyes, ears, mouth and genitals (private parts). Wash Face and genitals (private parts)  with your normal soap.  6. Wash thoroughly, paying special attention to the area where your surgery will be performed.  7. Thoroughly rinse your body with warm water from the neck down.  8. DO NOT shower/wash with your normal soap after using and rinsing off the CHG Soap.  9. Pat yourself dry with a CLEAN TOWEL.  10. Wear CLEAN PAJAMAS to bed the night before surgery, wear comfortable clothes the morning of surgery  11. Place CLEAN SHEETS on your bed the night of your  first shower and DO NOT SLEEP WITH PETS.    Day of Surgery: Do not apply any deodorants/lotions. Please wear clean clothes to the hospital/surgery center.

## 2017-05-24 NOTE — H&P (Addendum)
TOTAL KNEE ADMISSION H&P  Patient is being admitted for right total knee arthroplasty.  Subjective:  Chief Complaint:right knee pain.  HPI: Debra Simon, 67 y.o. female, has a history of pain and functional disability in the right knee due to arthritis and has failed non-surgical conservative treatments for greater than 12 weeks to includeNSAID's and/or analgesics, corticosteriod injections, viscosupplementation injections, flexibility and strengthening excercises, supervised PT with diminished ADL's post treatment, use of assistive devices and activity modification.  Onset of symptoms was gradual, starting 10 years ago with gradually worsening course since that time. The patient noted no past surgery on the right knee(s).  Patient currently rates pain in the right knee(s) at 10 out of 10 with activity. Patient has night pain, worsening of pain with activity and weight bearing, pain that interferes with activities of daily living, crepitus and joint swelling.  Patient has evidence of subchondral sclerosis, periarticular osteophytes and joint space narrowing by imaging studies. This patient has had. There is no active infection.  Patient Active Problem List   Diagnosis Date Noted  . Diverticulosis   . Hematuria 03/14/2017  . Arthritis of right knee 11/16/2016  . History of vaginal dysplasia 08/31/2016  . Mild mitral regurgitation 07/04/2016  . Diastolic dysfunction, left ventricle 06/26/2016  . Benign paroxysmal vertigo 06/02/2016  . Lacunar infarction 06/02/2016  . Hyperlipidemia with low HDL 06/02/2016  . Vaginal dysplasia 08/28/2015  . Sensorineural hearing loss (SNHL), bilateral 07/07/2015  . Tinnitus of both ears 07/07/2015  . Rectocele 05/30/2014  . Obesity 07/31/2013  . HTN (hypertension) 07/31/2013  . Vitamin D deficiency 07/31/2013  . Hyperglycemia 07/31/2013  . Vaginal atrophy 05/24/2013  . Personal history of colonic polyps 05/24/2013   Past Medical History:  Diagnosis  Date  . Arthritis   . Colon polyps 2010  . Diastolic dysfunction, left ventricle 06/26/2016   (ECHO 2018): Left ventricle: The cavity size was normal. Systolic function was normal. The estimated ejection fraction was in the range of 60% to 65%. Wall motion was normal; there were no regional wall motion abnormalities. Doppler parameters are consistent with abnormal left ventricular relaxation (grade 1 diastolic dysfunction).  . Diverticulosis   . Hypertension   . Mild mitral regurgitation 07/04/2016  . Ureterovaginal fistula   . Uterine fibroid 1994  . Vaginal dysplasia     Past Surgical History:  Procedure Laterality Date  . BREAST CYST ASPIRATION Right   . BREAST EXCISIONAL BIOPSY Right   . COLONOSCOPY  2010  . COLONOSCOPY  2012  . CYSTOSCOPY    . ESOPHAGOGASTRODUODENOSCOPY  2010  . TOTAL ABDOMINAL HYSTERECTOMY  1994  . URETERAL EXPLORATION  04/16/1992   URETEROTOMY ON THE RIGHT, REPAIR LACERATION ON THE LEFT    No current facility-administered medications for this encounter.    Current Outpatient Medications  Medication Sig Dispense Refill Last Dose  . acetaminophen (TYLENOL) 500 MG tablet Take 500 mg by mouth 2 (two) times daily as needed (for knee pain.).   Past Week at Unknown time  . aspirin EC 81 MG tablet Take 1 tablet (81 mg total) by mouth daily. 90 tablet 3 05/24/2017 at Unknown time  . losartan-hydrochlorothiazide (HYZAAR) 50-12.5 MG tablet Take 1 tablet by mouth daily. 90 tablet 3 05/24/2017 at Unknown time  . nitrofurantoin, macrocrystal-monohydrate, (MACROBID) 100 MG capsule Take 1 capsule (100 mg total) by mouth 2 (two) times daily. 20 capsule 0 05/24/2017 at Unknown time  . Polyethyl Glycol-Propyl Glycol (LUBRICANT EYE DROPS) 0.4-0.3 % SOLN Place 1-2 drops into  both eyes 3 (three) times daily as needed (for dry/irritated eyes.).   05/24/2017 at Unknown time  . simvastatin (ZOCOR) 20 MG tablet Take 1 tablet (20 mg total) by mouth at bedtime. 90 tablet 3 05/24/2017 at Unknown  time   No Known Allergies  Social History   Tobacco Use  . Smoking status: Never Smoker  . Smokeless tobacco: Never Used  Substance Use Topics  . Alcohol use: No    Alcohol/week: 0.0 oz    Comment: rare on holidays    Family History  Problem Relation Age of Onset  . Brain cancer Father   . Breast cancer Paternal Aunt   . Diabetes Mother   . Hypertension Mother   . Hypertension Sister   . Diabetes Brother   . Stomach cancer Paternal Uncle   . Diabetes Sister   . Uterine cancer Paternal Aunt   . CAD Neg Hx   . Stroke Neg Hx      Review of Systems  Constitutional: Negative.   HENT: Negative.   Eyes: Negative.   Respiratory: Negative.   Cardiovascular: Negative.   Gastrointestinal: Negative.   Genitourinary: Negative.   Musculoskeletal: Positive for back pain and joint pain.  Skin: Negative.   Neurological: Negative.   Endo/Heme/Allergies: Negative.   Psychiatric/Behavioral: Negative.     Objective:  Physical Exam  Constitutional: She is oriented to person, place, and time. She appears well-developed and well-nourished.  HENT:  Head: Normocephalic and atraumatic.  Mouth/Throat: Oropharynx is clear and moist.  Eyes: Conjunctivae are normal. Pupils are equal, round, and reactive to light.  Neck: Neck supple.  Cardiovascular: Normal rate and regular rhythm.  Respiratory: Effort normal and breath sounds normal.  GI: Soft. Bowel sounds are normal.  Genitourinary:  Genitourinary Comments: Not pertinent to current symptomatology therefore not examined.  Musculoskeletal:   Examination of her right knee reveals varus deformity with pain.  1+ synovitis.  Range of motion -5 to 120 degrees.  Knee is stable with normal patella tracking.  Examination of her left knee reveals full range of motion without pain, swelling, weakness or instability.    Neurological: She is alert and oriented to person, place, and time.  Skin: Skin is warm and dry.  Psychiatric: She has a normal  mood and affect. Her behavior is normal.    Vital signs in last 24 hours: Temp:  [97.8 F (36.6 C)] 97.8 F (36.6 C) (02/27 1400) Pulse Rate:  [76] 76 (02/27 1400) BP: (148)/(74) 148/74 (02/27 1400) SpO2:  [97 %] 97 % (02/27 1400) Weight:  [76.3 kg (168 lb 3.2 oz)] 76.3 kg (168 lb 3.2 oz) (02/27 1400)  Labs:   Estimated body mass index is 30.76 kg/m as calculated from the following:   Height as of this encounter: 5\' 2"  (1.575 m).   Weight as of this encounter: 76.3 kg (168 lb 3.2 oz).   Imaging Review Plain radiographs demonstrate severe degenerative joint disease of the right knee(s). The overall alignment issignificant varus. The bone quality appears to be good for age and reported activity level.  Assessment/Plan:  End stage arthritis, right knee  Principal Problem:   Primary localized osteoarthritis of right knee Active Problems:   HTN (hypertension)   Vitamin D deficiency   Lacunar infarction   Diastolic dysfunction, left ventricle   The patient history, physical examination, clinical judgment of the provider and imaging studies are consistent with end stage degenerative joint disease of the right knee(s) and total knee arthroplasty is deemed medically  necessary. The treatment options including medical management, injection therapy arthroscopy and arthroplasty were discussed at length. The risks and benefits of total knee arthroplasty were presented and reviewed. The risks due to aseptic loosening, infection, stiffness, patella tracking problems, thromboembolic complications and other imponderables were discussed. The patient acknowledged the explanation, agreed to proceed with the plan and consent was signed. Patient is being admitted for inpatient treatment for surgery, pain control, PT, OT, prophylactic antibiotics, VTE prophylaxis, progressive ambulation and ADL's and discharge planning. The patient is planning to be discharged home with home health services   PREOP UTI  HAS BEEN CLEARED WITH CEFTRIAXONE. CULTURE 05-25-17 SHOWED NO GROWTH  IT WAS A MULTIRESISTANT ECOLI.  WILL GIVE 2 GRAMS OF ANCEF AND 1 GRAM OF CEFTRIAXONE PREOP.

## 2017-05-25 ENCOUNTER — Encounter (HOSPITAL_COMMUNITY)
Admission: RE | Admit: 2017-05-25 | Discharge: 2017-05-25 | Disposition: A | Discharge status: Home / Self Care | Payer: Managed Care, Other (non HMO) | Source: Ambulatory Visit | Attending: Orthopedic Surgery | Admitting: Orthopedic Surgery

## 2017-05-25 ENCOUNTER — Other Ambulatory Visit: Payer: Self-pay

## 2017-05-25 ENCOUNTER — Encounter (HOSPITAL_COMMUNITY): Payer: Self-pay

## 2017-05-25 DIAGNOSIS — Z01812 Encounter for preprocedural laboratory examination: Secondary | ICD-10-CM | POA: Insufficient documentation

## 2017-05-25 HISTORY — DX: Gastro-esophageal reflux disease without esophagitis: K21.9

## 2017-05-25 HISTORY — DX: Cardiac murmur, unspecified: R01.1

## 2017-05-25 LAB — CBC WITH DIFFERENTIAL/PLATELET
BASOS PCT: 0 %
Basophils Absolute: 0 10*3/uL (ref 0.0–0.1)
EOS ABS: 0.3 10*3/uL (ref 0.0–0.7)
Eosinophils Relative: 4 %
HCT: 42.1 % (ref 36.0–46.0)
Hemoglobin: 13.9 g/dL (ref 12.0–15.0)
LYMPHS ABS: 2.1 10*3/uL (ref 0.7–4.0)
Lymphocytes Relative: 26 %
MCH: 29.7 pg (ref 26.0–34.0)
MCHC: 33 g/dL (ref 30.0–36.0)
MCV: 90 fL (ref 78.0–100.0)
MONO ABS: 0.4 10*3/uL (ref 0.1–1.0)
MONOS PCT: 5 %
Neutro Abs: 5.4 10*3/uL (ref 1.7–7.7)
Neutrophils Relative %: 65 %
Platelets: 229 10*3/uL (ref 150–400)
RBC: 4.68 MIL/uL (ref 3.87–5.11)
RDW: 13.3 % (ref 11.5–15.5)
WBC: 8.3 10*3/uL (ref 4.0–10.5)

## 2017-05-25 LAB — COMPREHENSIVE METABOLIC PANEL
ALBUMIN: 4.3 g/dL (ref 3.5–5.0)
ALT: 18 U/L (ref 14–54)
ANION GAP: 11 (ref 5–15)
AST: 25 U/L (ref 15–41)
Alkaline Phosphatase: 86 U/L (ref 38–126)
BILIRUBIN TOTAL: 0.5 mg/dL (ref 0.3–1.2)
BUN: 15 mg/dL (ref 6–20)
CALCIUM: 9.3 mg/dL (ref 8.9–10.3)
CO2: 24 mmol/L (ref 22–32)
Chloride: 104 mmol/L (ref 101–111)
Creatinine, Ser: 0.63 mg/dL (ref 0.44–1.00)
GFR calc non Af Amer: >60 mL/min (ref >60)
GLUCOSE: 111 mg/dL — AB (ref 65–99)
POTASSIUM: 3.4 mmol/L — AB (ref 3.5–5.1)
Sodium: 139 mmol/L (ref 135–145)
Total Protein: 7.8 g/dL (ref 6.5–8.1)

## 2017-05-25 LAB — ABO/RH: ABO/RH(D): O POS

## 2017-05-25 LAB — APTT: aPTT: 37 seconds — ABNORMAL HIGH (ref 24–36)

## 2017-05-25 LAB — PROTIME-INR
INR: 1.12
PROTHROMBIN TIME: 14.3 s (ref 11.4–15.2)

## 2017-05-25 LAB — SURGICAL PCR SCREEN
MRSA, PCR: NEGATIVE
Staphylococcus aureus: NEGATIVE

## 2017-05-25 LAB — HEMOGLOBIN A1C
HEMOGLOBIN A1C: 5.6 % (ref 4.8–5.6)
MEAN PLASMA GLUCOSE: 114.02 mg/dL

## 2017-05-25 LAB — TYPE AND SCREEN
ABO/RH(D): O POS
Antibody Screen: NEGATIVE

## 2017-05-25 NOTE — Progress Notes (Signed)
PCP is Dr. Briscoe Deutscher [facility]  LOV 05/2016 Thru interpretor, pt she was told she had mumur.  Was sent for echo 05/2016  Currently denies any CP, SOB, palpitations. Never was sent to see a cardio as far as she knows.

## 2017-05-26 LAB — URINE CULTURE: Culture: NO GROWTH

## 2017-05-26 NOTE — Progress Notes (Signed)
Anesthesia Chart Review:  Pt is a 67 year old female scheduled for R total knee arthroplasty on 06/05/2017 with Elsie Saas, MD  - PCP is Briscoe Deutscher, DO  PMH includes:  HTN, mild mitral regurgitation, GERD. Never smoker. BMI 30  Medications include: ASA 81mg , losartan-hctz macrobid, simvastatin  BP (!) 158/93   Pulse 77   Temp 36.7 C   Resp 20   Ht 5\' 2"  (1.575 m)   Wt 165 lb (74.8 kg)   SpO2 95%   BMI 30.18 kg/m   Preoperative labs reviewed.   - HbA1c 5.6, glucose 111  EKG 05/12/17: Sinus  Bradycardia (59 bpm)  Echo 06/22/16:  - Left ventricle: The cavity size was normal. Systolic function was normal. The estimated ejection fraction was in the range of 60% to 65%. Wall motion was normal; there were no regional wall motion abnormalities. Doppler parameters are consistent with abnormal left ventricular relaxation (grade 1 diastolic dysfunction). - Mitral valve: There was mild regurgitation. - Left atrium: The atrium was normal in size. - Right ventricle: Systolic function was normal. - Pulmonary arteries: Systolic pressure was within the normal range.  If no changes, I anticipate pt can proceed with surgery as scheduled.   Willeen Cass, FNP-BC [facility]/Anesthesiology Phone: [phone] 05/26/2017 1:54 PM

## 2017-06-02 MED ORDER — BUPIVACAINE LIPOSOME 1.3 % IJ SUSP
20.0000 mL | INTRAMUSCULAR | Status: AC
  Administered 2017-06-05: 20 mL
  Filled 2017-06-02: qty 20

## 2017-06-02 MED ORDER — SODIUM CHLORIDE 0.9 % IV SOLN
1000.0000 mg | INTRAVENOUS | Status: AC
  Administered 2017-06-05: 1000 mg via INTRAVENOUS
  Filled 2017-06-02: qty 1100

## 2017-06-04 NOTE — Anesthesia Preprocedure Evaluation (Addendum)
Anesthesia Evaluation  Patient identified by MRN, date of birth, ID band Patient awake    Reviewed: Allergy & Precautions, NPO status , Patient's Chart, lab work & pertinent test results, reviewed documented beta blocker date and time   Airway Mallampati: III  TM Distance: >3 FB Neck ROM: Full    Dental no notable dental hx. (+) Teeth Intact   Pulmonary neg pulmonary ROS,    Pulmonary exam normal breath sounds clear to auscultation       Cardiovascular hypertension, Pt. on medications Normal cardiovascular exam+ Valvular Problems/Murmurs  Rhythm:Regular Rate:Normal     Neuro/Psych Bilateral Sensorineural hearing loss negative psych ROS   GI/Hepatic Neg liver ROS, GERD  Medicated and Controlled,  Endo/Other  Obesity Hyperlipidemia  Renal/GU negative Renal ROS  negative genitourinary   Musculoskeletal  (+) Arthritis , Osteoarthritis,  OA right knee   Abdominal (+) + obese,   Peds  Hematology negative hematology ROS (+)   Anesthesia Other Findings   Reproductive/Obstetrics                          Anesthesia Physical Anesthesia Plan  ASA: II  Anesthesia Plan: Spinal   Post-op Pain Management:  Regional for Post-op pain   Induction:   PONV Risk Score and Plan: 3 and Midazolam, Propofol infusion, Ondansetron and Treatment may vary due to age or medical condition  Airway Management Planned: Natural Airway  Additional Equipment:   Intra-op Plan:   Post-operative Plan:   Informed Consent: I have reviewed the patients History and Physical, chart, labs and discussed the procedure including the risks, benefits and alternatives for the proposed anesthesia with the patient or authorized representative who has indicated his/her understanding and acceptance.   Dental advisory given  Plan Discussed with: CRNA, Anesthesiologist and Surgeon  Anesthesia Plan Comments:        Anesthesia  Quick Evaluation

## 2017-06-05 ENCOUNTER — Encounter (HOSPITAL_COMMUNITY)
Admission: RE | Disposition: A | Discharge status: Home / Self Care | Payer: Self-pay | Source: Ambulatory Visit | Attending: Orthopedic Surgery

## 2017-06-05 ENCOUNTER — Inpatient Hospital Stay (HOSPITAL_COMMUNITY): Payer: Medicare Other | Admitting: Anesthesiology

## 2017-06-05 ENCOUNTER — Inpatient Hospital Stay (HOSPITAL_COMMUNITY): Payer: Medicare Other | Admitting: Vascular Surgery

## 2017-06-05 ENCOUNTER — Observation Stay (HOSPITAL_COMMUNITY)
Admission: RE | Admit: 2017-06-05 | Discharge: 2017-06-06 | Disposition: A | Discharge status: Home / Self Care | Payer: Medicare Other | Source: Ambulatory Visit | Attending: Orthopedic Surgery | Admitting: Orthopedic Surgery

## 2017-06-05 DIAGNOSIS — E669 Obesity, unspecified: Secondary | ICD-10-CM | POA: Diagnosis not present

## 2017-06-05 DIAGNOSIS — E559 Vitamin D deficiency, unspecified: Secondary | ICD-10-CM | POA: Diagnosis not present

## 2017-06-05 DIAGNOSIS — M1711 Unilateral primary osteoarthritis, right knee: Secondary | ICD-10-CM | POA: Diagnosis present

## 2017-06-05 DIAGNOSIS — E785 Hyperlipidemia, unspecified: Secondary | ICD-10-CM | POA: Insufficient documentation

## 2017-06-05 DIAGNOSIS — I519 Heart disease, unspecified: Secondary | ICD-10-CM | POA: Diagnosis present

## 2017-06-05 DIAGNOSIS — I6381 Other cerebral infarction due to occlusion or stenosis of small artery: Secondary | ICD-10-CM | POA: Diagnosis present

## 2017-06-05 DIAGNOSIS — I10 Essential (primary) hypertension: Secondary | ICD-10-CM | POA: Diagnosis present

## 2017-06-05 DIAGNOSIS — K219 Gastro-esophageal reflux disease without esophagitis: Secondary | ICD-10-CM | POA: Diagnosis not present

## 2017-06-05 DIAGNOSIS — G8918 Other acute postprocedural pain: Secondary | ICD-10-CM | POA: Diagnosis not present

## 2017-06-05 DIAGNOSIS — Z6830 Body mass index (BMI) 30.0-30.9, adult: Secondary | ICD-10-CM | POA: Diagnosis not present

## 2017-06-05 DIAGNOSIS — Z79899 Other long term (current) drug therapy: Secondary | ICD-10-CM | POA: Diagnosis not present

## 2017-06-05 DIAGNOSIS — M170 Bilateral primary osteoarthritis of knee: Secondary | ICD-10-CM | POA: Diagnosis present

## 2017-06-05 DIAGNOSIS — M1712 Unilateral primary osteoarthritis, left knee: Secondary | ICD-10-CM | POA: Diagnosis present

## 2017-06-05 HISTORY — PX: TOTAL KNEE ARTHROPLASTY: SHX125

## 2017-06-05 HISTORY — DX: Unilateral primary osteoarthritis, right knee: M17.11

## 2017-06-05 SURGERY — ARTHROPLASTY, KNEE, TOTAL
Anesthesia: Spinal | Site: Knee | Laterality: Right

## 2017-06-05 MED ORDER — ONDANSETRON HCL 4 MG/2ML IJ SOLN: 4.0000 mg | Freq: Four times a day (QID) | INTRAMUSCULAR | Status: DC | PRN

## 2017-06-05 MED ORDER — BUPIVACAINE-EPINEPHRINE (PF) 0.5% -1:200000 IJ SOLN
INTRAMUSCULAR | Status: AC
  Filled 2017-06-05: qty 30

## 2017-06-05 MED ORDER — CEFAZOLIN SODIUM-DEXTROSE 2-4 GM/100ML-% IV SOLN
2.0000 g | INTRAVENOUS | Status: AC
  Administered 2017-06-05: 2 g via INTRAVENOUS

## 2017-06-05 MED ORDER — METOCLOPRAMIDE HCL 5 MG/ML IJ SOLN: 5.0000 mg | Freq: Three times a day (TID) | INTRAMUSCULAR | Status: DC | PRN

## 2017-06-05 MED ORDER — POTASSIUM CHLORIDE IN NACL 20-0.9 MEQ/L-% IV SOLN
INTRAVENOUS | Status: DC
  Administered 2017-06-05 – 2017-06-06 (×2): via INTRAVENOUS
  Filled 2017-06-05 (×2): qty 1000

## 2017-06-05 MED ORDER — TOBRAMYCIN SULFATE 1.2 G IJ SOLR
INTRAMUSCULAR | Status: DC | PRN
  Administered 2017-06-05: 1.2 g

## 2017-06-05 MED ORDER — DIPHENHYDRAMINE HCL 12.5 MG/5ML PO ELIX: 12.5000 mg | ORAL_SOLUTION | ORAL | Status: DC | PRN

## 2017-06-05 MED ORDER — DEXAMETHASONE SODIUM PHOSPHATE 10 MG/ML IJ SOLN
INTRAMUSCULAR | Status: DC | PRN
  Administered 2017-06-05: 10 mg via INTRAVENOUS

## 2017-06-05 MED ORDER — ACETAMINOPHEN 325 MG PO TABS
325.0000 mg | ORAL_TABLET | Freq: Four times a day (QID) | ORAL | Status: DC | PRN
  Administered 2017-06-06: 650 mg via ORAL
  Filled 2017-06-05 (×2): qty 2

## 2017-06-05 MED ORDER — POLYETHYL GLYCOL-PROPYL GLYCOL 0.4-0.3 % OP SOLN: 1.0000 [drp] | Freq: Three times a day (TID) | OPHTHALMIC | Status: DC | PRN

## 2017-06-05 MED ORDER — FENTANYL CITRATE (PF) 100 MCG/2ML IJ SOLN: 25.0000 ug | INTRAMUSCULAR | Status: DC | PRN

## 2017-06-05 MED ORDER — ALUM & MAG HYDROXIDE-SIMETH 200-200-20 MG/5ML PO SUSP: 30.0000 mL | ORAL | Status: DC | PRN

## 2017-06-05 MED ORDER — DOCUSATE SODIUM 100 MG PO CAPS
100.0000 mg | ORAL_CAPSULE | Freq: Two times a day (BID) | ORAL | Status: DC
  Administered 2017-06-05 – 2017-06-06 (×2): 100 mg via ORAL
  Filled 2017-06-05 (×3): qty 1

## 2017-06-05 MED ORDER — MIDAZOLAM HCL 5 MG/5ML IJ SOLN
INTRAMUSCULAR | Status: DC | PRN
  Administered 2017-06-05: 2 mg via INTRAVENOUS

## 2017-06-05 MED ORDER — ONDANSETRON HCL 4 MG PO TABS: 4.0000 mg | ORAL_TABLET | Freq: Four times a day (QID) | ORAL | Status: DC | PRN

## 2017-06-05 MED ORDER — CEFAZOLIN SODIUM-DEXTROSE 2-4 GM/100ML-% IV SOLN
INTRAVENOUS | Status: AC
  Filled 2017-06-05: qty 100

## 2017-06-05 MED ORDER — PROPOFOL 500 MG/50ML IV EMUL
INTRAVENOUS | Status: DC | PRN
  Administered 2017-06-05: 50 ug/kg/min via INTRAVENOUS

## 2017-06-05 MED ORDER — METOCLOPRAMIDE HCL 5 MG PO TABS: 5.0000 mg | ORAL_TABLET | Freq: Three times a day (TID) | ORAL | Status: DC | PRN

## 2017-06-05 MED ORDER — OXYCODONE HCL 5 MG PO TABS
5.0000 mg | ORAL_TABLET | ORAL | Status: DC | PRN
  Administered 2017-06-06: 10 mg via ORAL
  Filled 2017-06-05: qty 2

## 2017-06-05 MED ORDER — SODIUM CHLORIDE 0.9 % IV SOLN
1.0000 g | Freq: Once | INTRAVENOUS | Status: AC
  Administered 2017-06-05: 1 g via INTRAVENOUS
  Filled 2017-06-05: qty 10

## 2017-06-05 MED ORDER — SIMVASTATIN 20 MG PO TABS
20.0000 mg | ORAL_TABLET | Freq: Every day | ORAL | Status: DC
  Administered 2017-06-05: 20 mg via ORAL
  Filled 2017-06-05: qty 1

## 2017-06-05 MED ORDER — POLYETHYLENE GLYCOL 3350 17 G PO PACK
17.0000 g | PACK | Freq: Two times a day (BID) | ORAL | Status: DC
  Administered 2017-06-06: 17 g via ORAL
  Filled 2017-06-05 (×2): qty 1

## 2017-06-05 MED ORDER — SODIUM CHLORIDE 0.9 % IJ SOLN
INTRAMUSCULAR | Status: DC | PRN
  Administered 2017-06-05: 50 mL

## 2017-06-05 MED ORDER — HYPROMELLOSE (GONIOSCOPIC) 2.5 % OP SOLN: 1.0000 [drp] | Freq: Three times a day (TID) | OPHTHALMIC | Status: DC | PRN

## 2017-06-05 MED ORDER — CHLORHEXIDINE GLUCONATE 4 % EX LIQD: 60.0000 mL | Freq: Once | CUTANEOUS | Status: DC

## 2017-06-05 MED ORDER — BUPIVACAINE IN DEXTROSE 0.75-8.25 % IT SOLN
INTRATHECAL | Status: DC | PRN
  Administered 2017-06-05: 1.8 mL via INTRATHECAL

## 2017-06-05 MED ORDER — APIXABAN 2.5 MG PO TABS
2.5000 mg | ORAL_TABLET | Freq: Two times a day (BID) | ORAL | Status: DC
  Administered 2017-06-06: 2.5 mg via ORAL
  Filled 2017-06-05: qty 1

## 2017-06-05 MED ORDER — PROPOFOL 1000 MG/100ML IV EMUL
INTRAVENOUS | Status: AC
  Filled 2017-06-05: qty 100

## 2017-06-05 MED ORDER — FENTANYL CITRATE (PF) 100 MCG/2ML IJ SOLN
INTRAMUSCULAR | Status: DC | PRN
  Administered 2017-06-05: 100 ug via INTRAVENOUS

## 2017-06-05 MED ORDER — CEFAZOLIN SODIUM-DEXTROSE 2-4 GM/100ML-% IV SOLN
2.0000 g | Freq: Four times a day (QID) | INTRAVENOUS | Status: AC
  Administered 2017-06-05 (×2): 2 g via INTRAVENOUS
  Filled 2017-06-05 (×2): qty 100

## 2017-06-05 MED ORDER — POVIDONE-IODINE 7.5 % EX SOLN: Freq: Once | CUTANEOUS | Status: DC

## 2017-06-05 MED ORDER — SODIUM CHLORIDE 0.9 % IR SOLN
Status: DC | PRN
Start: 2017-06-05 — End: 2017-06-05
  Administered 2017-06-05: 3000 mL

## 2017-06-05 MED ORDER — OXYCODONE HCL 5 MG PO TABS: 10.0000 mg | ORAL_TABLET | ORAL | Status: DC | PRN

## 2017-06-05 MED ORDER — 0.9 % SODIUM CHLORIDE (POUR BTL) OPTIME
TOPICAL | Status: DC | PRN
  Administered 2017-06-05: 1000 mL

## 2017-06-05 MED ORDER — DEXAMETHASONE SODIUM PHOSPHATE 10 MG/ML IJ SOLN
10.0000 mg | Freq: Three times a day (TID) | INTRAMUSCULAR | Status: DC
  Administered 2017-06-05 – 2017-06-06 (×3): 10 mg via INTRAVENOUS
  Filled 2017-06-05 (×3): qty 1

## 2017-06-05 MED ORDER — PROPOFOL 10 MG/ML IV BOLUS
INTRAVENOUS | Status: AC
  Filled 2017-06-05: qty 20

## 2017-06-05 MED ORDER — LACTATED RINGERS IV SOLN
INTRAVENOUS | Status: DC | PRN
  Administered 2017-06-05 (×2): via INTRAVENOUS

## 2017-06-05 MED ORDER — ONDANSETRON HCL 4 MG/2ML IJ SOLN
INTRAMUSCULAR | Status: DC | PRN
  Administered 2017-06-05: 4 mg via INTRAVENOUS

## 2017-06-05 MED ORDER — DEXTROSE 5 % IV SOLN
INTRAVENOUS | Status: DC | PRN
  Administered 2017-06-05: 40 ug/min via INTRAVENOUS

## 2017-06-05 MED ORDER — ONDANSETRON HCL 4 MG/2ML IJ SOLN
INTRAMUSCULAR | Status: AC
  Filled 2017-06-05: qty 2

## 2017-06-05 MED ORDER — METOCLOPRAMIDE HCL 5 MG/ML IJ SOLN: 10.0000 mg | Freq: Once | INTRAMUSCULAR | Status: DC | PRN

## 2017-06-05 MED ORDER — PHENOL 1.4 % MT LIQD: 1.0000 | OROMUCOSAL | Status: DC | PRN

## 2017-06-05 MED ORDER — ACETAMINOPHEN 500 MG PO TABS
1000.0000 mg | ORAL_TABLET | Freq: Four times a day (QID) | ORAL | Status: AC
  Administered 2017-06-05 – 2017-06-06 (×4): 1000 mg via ORAL
  Filled 2017-06-05 (×3): qty 2

## 2017-06-05 MED ORDER — HYDROMORPHONE HCL 1 MG/ML IJ SOLN
0.5000 mg | INTRAMUSCULAR | Status: DC | PRN
  Administered 2017-06-05: 1 mg via INTRAVENOUS
  Filled 2017-06-05: qty 1

## 2017-06-05 MED ORDER — DEXAMETHASONE SODIUM PHOSPHATE 10 MG/ML IJ SOLN
INTRAMUSCULAR | Status: AC
  Filled 2017-06-05: qty 1

## 2017-06-05 MED ORDER — TOBRAMYCIN SULFATE 1.2 G IJ SOLR
INTRAMUSCULAR | Status: AC
  Filled 2017-06-05: qty 1.2

## 2017-06-05 MED ORDER — GABAPENTIN 300 MG PO CAPS
300.0000 mg | ORAL_CAPSULE | Freq: Every day | ORAL | Status: DC
  Administered 2017-06-05: 300 mg via ORAL
  Filled 2017-06-05: qty 1

## 2017-06-05 MED ORDER — PROPOFOL 10 MG/ML IV BOLUS
INTRAVENOUS | Status: DC | PRN
  Administered 2017-06-05: 10 mg via INTRAVENOUS

## 2017-06-05 MED ORDER — MIDAZOLAM HCL 2 MG/2ML IJ SOLN
INTRAMUSCULAR | Status: AC
  Filled 2017-06-05: qty 2

## 2017-06-05 MED ORDER — MEPERIDINE HCL 50 MG/ML IJ SOLN: 6.2500 mg | INTRAMUSCULAR | Status: DC | PRN

## 2017-06-05 MED ORDER — LACTATED RINGERS IV SOLN: INTRAVENOUS | Status: DC

## 2017-06-05 MED ORDER — MENTHOL 3 MG MT LOZG: 1.0000 | LOZENGE | OROMUCOSAL | Status: DC | PRN

## 2017-06-05 MED ORDER — ROPIVACAINE HCL 7.5 MG/ML IJ SOLN
INTRAMUSCULAR | Status: DC | PRN
  Administered 2017-06-05: 20 mL via PERINEURAL

## 2017-06-05 MED ORDER — BUPIVACAINE-EPINEPHRINE 0.5% -1:200000 IJ SOLN
INTRAMUSCULAR | Status: DC | PRN
  Administered 2017-06-05: 50 mL

## 2017-06-05 MED ORDER — FENTANYL CITRATE (PF) 250 MCG/5ML IJ SOLN
INTRAMUSCULAR | Status: AC
  Filled 2017-06-05: qty 5

## 2017-06-05 SURGICAL SUPPLY — 71 items
BANDAGE ESMARK 6X9 LF (GAUZE/BANDAGES/DRESSINGS) ×1 IMPLANT
BENZOIN TINCTURE PRP APPL 2/3 (GAUZE/BANDAGES/DRESSINGS) ×3 IMPLANT
BLADE SAGITTAL 25.0X1.19X90 (BLADE) ×2 IMPLANT
BLADE SAGITTAL 25.0X1.19X90MM (BLADE) ×1
BLADE SAW SGTL 13X75X1.27 (BLADE) ×3 IMPLANT
BLADE SURG 10 STRL SS (BLADE) ×6 IMPLANT
BNDG ELASTIC 6X10 VLCR STRL LF (GAUZE/BANDAGES/DRESSINGS) ×3 IMPLANT
BNDG ELASTIC 6X15 VLCR STRL LF (GAUZE/BANDAGES/DRESSINGS) IMPLANT
BNDG ESMARK 6X9 LF (GAUZE/BANDAGES/DRESSINGS) ×3
BOWL SMART MIX CTS (DISPOSABLE) ×3 IMPLANT
CAPT KNEE TOTAL 3 ATTUNE ×3 IMPLANT
CEMENT HV SMART SET (Cement) ×6 IMPLANT
CLOSURE STERI-STRIP 1/2X4 (GAUZE/BANDAGES/DRESSINGS) ×1
CLOSURE WOUND 1/2 X4 (GAUZE/BANDAGES/DRESSINGS) ×1
CLSR STERI-STRIP ANTIMIC 1/2X4 (GAUZE/BANDAGES/DRESSINGS) ×2 IMPLANT
COVER SURGICAL LIGHT HANDLE (MISCELLANEOUS) ×3 IMPLANT
CUFF TOURNIQUET SINGLE 34IN LL (TOURNIQUET CUFF) ×3 IMPLANT
CUFF TOURNIQUET SINGLE 44IN (TOURNIQUET CUFF) IMPLANT
DECANTER SPIKE VIAL GLASS SM (MISCELLANEOUS) ×3 IMPLANT
DRAPE EXTREMITY T 121X128X90 (DRAPE) ×3 IMPLANT
DRAPE HALF SHEET 40X57 (DRAPES) ×6 IMPLANT
DRAPE INCISE IOBAN 66X45 STRL (DRAPES) IMPLANT
DRAPE ORTHO SPLIT 77X108 STRL (DRAPES) ×2
DRAPE SURG ORHT 6 SPLT 77X108 (DRAPES) ×1 IMPLANT
DRAPE U-SHAPE 47X51 STRL (DRAPES) ×3 IMPLANT
DRSG AQUACEL AG ADV 3.5X10 (GAUZE/BANDAGES/DRESSINGS) ×3 IMPLANT
DURAPREP 26ML APPLICATOR (WOUND CARE) ×3 IMPLANT
ELECT CAUTERY BLADE 6.4 (BLADE) ×3 IMPLANT
ELECT REM PT RETURN 9FT ADLT (ELECTROSURGICAL) ×3
ELECTRODE REM PT RTRN 9FT ADLT (ELECTROSURGICAL) ×1 IMPLANT
FACESHIELD WRAPAROUND (MASK) ×3 IMPLANT
GLOVE BIO SURGEON STRL SZ7 (GLOVE) ×3 IMPLANT
GLOVE BIOGEL PI IND STRL 7.0 (GLOVE) ×1 IMPLANT
GLOVE BIOGEL PI IND STRL 7.5 (GLOVE) ×1 IMPLANT
GLOVE BIOGEL PI INDICATOR 7.0 (GLOVE) ×2
GLOVE BIOGEL PI INDICATOR 7.5 (GLOVE) ×2
GLOVE SS BIOGEL STRL SZ 7.5 (GLOVE) ×1 IMPLANT
GLOVE SUPERSENSE BIOGEL SZ 7.5 (GLOVE) ×2
GOWN STRL REUS W/ TWL LRG LVL3 (GOWN DISPOSABLE) ×1 IMPLANT
GOWN STRL REUS W/ TWL XL LVL3 (GOWN DISPOSABLE) ×1 IMPLANT
GOWN STRL REUS W/TWL LRG LVL3 (GOWN DISPOSABLE) ×2
GOWN STRL REUS W/TWL XL LVL3 (GOWN DISPOSABLE) ×2
HANDPIECE INTERPULSE COAX TIP (DISPOSABLE) ×2
HOOD PEEL AWAY FACE SHEILD DIS (HOOD) ×9 IMPLANT
IMMOBILIZER KNEE 22 (SOFTGOODS) ×3 IMPLANT
IMMOBILIZER KNEE 22 UNIV (SOFTGOODS) ×3 IMPLANT
KIT BASIN OR (CUSTOM PROCEDURE TRAY) ×3 IMPLANT
KIT ROOM TURNOVER OR (KITS) ×3 IMPLANT
MANIFOLD NEPTUNE II (INSTRUMENTS) ×3 IMPLANT
MARKER SKIN DUAL TIP RULER LAB (MISCELLANEOUS) ×3 IMPLANT
NEEDLE HYPO 22GX1.5 SAFETY (NEEDLE) ×6 IMPLANT
NS IRRIG 1000ML POUR BTL (IV SOLUTION) ×3 IMPLANT
PACK TOTAL JOINT (CUSTOM PROCEDURE TRAY) ×3 IMPLANT
PAD ARMBOARD 7.5X6 YLW CONV (MISCELLANEOUS) ×6 IMPLANT
SET HNDPC FAN SPRY TIP SCT (DISPOSABLE) ×1 IMPLANT
STRIP CLOSURE SKIN 1/2X4 (GAUZE/BANDAGES/DRESSINGS) ×2 IMPLANT
SUCTION FRAZIER HANDLE 10FR (MISCELLANEOUS) ×2
SUCTION TUBE FRAZIER 10FR DISP (MISCELLANEOUS) ×1 IMPLANT
SUT MNCRL AB 3-0 PS2 18 (SUTURE) ×3 IMPLANT
SUT VIC AB 0 CT1 27 (SUTURE) ×4
SUT VIC AB 0 CT1 27XBRD ANBCTR (SUTURE) ×2 IMPLANT
SUT VIC AB 1 CT1 27 (SUTURE) ×2
SUT VIC AB 1 CT1 27XBRD ANBCTR (SUTURE) ×1 IMPLANT
SUT VIC AB 2-0 CT1 27 (SUTURE) ×4
SUT VIC AB 2-0 CT1 TAPERPNT 27 (SUTURE) ×2 IMPLANT
SYR CONTROL 10ML LL (SYRINGE) ×6 IMPLANT
TOWEL OR 17X24 6PK STRL BLUE (TOWEL DISPOSABLE) ×3 IMPLANT
TOWEL OR 17X26 10 PK STRL BLUE (TOWEL DISPOSABLE) ×3 IMPLANT
TRAY CATH 16FR W/PLASTIC CATH (SET/KITS/TRAYS/PACK) IMPLANT
TRAY FOLEY CATH SILVER 16FR (SET/KITS/TRAYS/PACK) ×3 IMPLANT
WATER STERILE IRR 1000ML POUR (IV SOLUTION) IMPLANT

## 2017-06-05 NOTE — Anesthesia Postprocedure Evaluation (Signed)
Anesthesia Post Note  Patient: Glass blower/designer  Procedure(s) Performed: RIGHT TOTAL KNEE ARTHROPLASTY (Right Knee)     Patient location during evaluation: PACU Anesthesia Type: Spinal Level of consciousness: oriented and awake and alert Pain management: pain level controlled Vital Signs Assessment: post-procedure vital signs reviewed and stable Respiratory status: spontaneous breathing, respiratory function stable and nonlabored ventilation Cardiovascular status: blood pressure returned to baseline and stable Postop Assessment: no headache, no backache, no apparent nausea or vomiting, spinal receding and patient able to bend at knees Anesthetic complications: no    Last Vitals:  Vitals:   06/05/17 1012 06/05/17 1027  BP: 114/61 133/64  Pulse: 68 66  Resp: 14 11  Temp:    SpO2: 100% 99%    Last Pain:  Vitals:   06/05/17 1030  TempSrc:   PainSc: 0-No pain    LLE Motor Response: No movement due to regional block (06/05/17 1030) LLE Sensation: Numbness (06/05/17 1030) RLE Motor Response: No movement due to regional block (06/05/17 1030) RLE Sensation: Numbness (06/05/17 1030) L Sensory Level: L5-Outer lower leg, top of foot, great toe (06/05/17 1030) R Sensory Level: L5-Outer lower leg, top of foot, great toe (06/05/17 1030)  Kathlen Sakurai A.

## 2017-06-05 NOTE — Op Note (Signed)
MRN:     [id number] DOB/AGE:    12/08/1950 / 67 y.o.       OPERATIVE REPORT    DATE OF PROCEDURE:  06/05/2017       PREOPERATIVE DIAGNOSIS:   Primary localized OA right knee      Estimated body mass index is 30.18 kg/m as calculated from the following:   Height as of 05/25/17: 5\' 2"  (1.575 m).   Weight as of this encounter: 74.8 kg (165 lb).                                                        POSTOPERATIVE DIAGNOSIS:   same                                                                      PROCEDURE:  Procedure(s): RIGHT TOTAL KNEE ARTHROPLASTY Using Depuy Attune RP implants #4 Femur, #4Tibia, 12mm  RP bearing, 32 Patella     SURGEON: Lorn Junes    ASSISTANT:  Kirstin Shepperson PA-C   (Present and scrubbed throughout the case, critical for assistance with exposure, retraction, instrumentation, and closure.)         ANESTHESIA: Spinal with Adductor Nerve Block     TOURNIQUET TIME: 82XHB   COMPLICATIONS:  None     SPECIMENS: None   INDICATIONS FOR PROCEDURE: The patient has  degenerative joint disease right knee, varus deformities, XR shows bone on bone arthritis. Patient has failed all conservative measures including anti-inflammatory medicines, narcotics, attempts at  exercise and weight loss, cortisone injections and viscosupplementation.  Risks and benefits of surgery have been discussed, questions answered.   DESCRIPTION OF PROCEDURE: The patient identified by armband, received  right femoral nerve block and IV antibiotics, in the holding area at [facility]. Patient taken to the operating room, appropriate anesthetic  monitors were attached Spinal anesthesia induced with  the patient in supine position, Foley catheter was inserted. Tourniquet  applied high to the operative thigh. Lateral post and foot positioner  applied to the table, the lower extremity was then prepped and draped  in usual sterile fashion from the ankle to the tourniquet. Time-out procedure  was performed. The limb was wrapped with an Esmarch bandage and the tourniquet inflated to 365 mmHg. We began the operation by making the anterior midline incision starting at handbreadth above the patella going over the patella 1 cm medial to and  4 cm distal to the tibial tubercle. Small bleeders in the skin and the  subcutaneous tissue identified and cauterized. Transverse retinaculum was incised and reflected medially and a medial parapatellar arthrotomy was accomplished. the patella was everted and theprepatellar fat pad resected. The superficial medial collateral  ligament was then elevated from anterior to posterior along the proximal  flare of the tibia and anterior half of the menisci resected. The knee was hyperflexed exposing bone on bone arthritis. Peripheral and notch osteophytes as well as the cruciate ligaments were then resected. We continued to  work our way around posteriorly along the proximal tibia, and externally  rotated the tibia  subluxing it out from underneath the femur. A McHale  retractor was placed through the notch and a lateral Hohmann retractor  placed, and we then drilled through the proximal tibia in line with the  axis of the tibia followed by an intramedullary guide rod and 2-degree  posterior slope cutting guide. The tibial cutting guide was pinned into place  allowing resection of 4 mm of bone medially and about 6 mm of bone  laterally because of her varus deformity. Satisfied with the tibial resection, we then  entered the distal femur 2 mm anterior to the PCL origin with the  intramedullary guide rod and applied the distal femoral cutting guide  set at 57mm, with 5 degrees of valgus. This was pinned along the  epicondylar axis. At this point, the distal femoral cut was accomplished without difficulty. We then sized for a #4 femoral component and pinned the guide in 3 degrees of external rotation.The chamfer cutting guide was pinned into place. The anterior,  posterior, and chamfer cuts were accomplished without difficulty followed by  the  RP box cutting guide and the box cut. We also removed posterior osteophytes from the posterior femoral condyles. At this  time, the knee was brought into full extension. We checked our  extension and flexion gaps and found them symmetric at 16mm.  The patella thickness measured at 21 mm. We set the cutting guide at 13 and removed the posterior 8 mm  of the patella sized for 32 button and drilled the lollipop. The knee  was then once again hyperflexed exposing the proximal tibia. We sized for a #4 tibial base plate, applied the smokestack and the conical reamer followed by the the Delta fin keel punch. We then hammered into place the  RP trial femoral component, inserted a 1 trial bearing, trial patellar button, and took the knee through range of motion from 0-130 degrees. No thumb pressure was required for patellar  tracking. At this point, all trial components were removed, a double batch of DePuy HV cement with 1500 mg of Tobramycin was mixed and applied to all bony metallic mating surfaces except for the posterior condyles of the femur itself. In order, we  hammered into place the tibial tray and removed excess cement, the femoral component and removed excess cement, a 61mm  RP bearing  was inserted, and the knee brought to full extension with compression.  The patellar button was clamped into place, and excess cement  removed. While the cement cured the wound was irrigated out with normal saline solution pulse lavage.. Ligament stability and patellar tracking were checked and found to be excellent.. The parapatellar arthrotomy was closed with  #1 Vicryl suture. The subcutaneous tissue with 0 and 2-0 undyed  Vicryl suture, and 4-0 Monocryl.. A dressing of Aquaseal,  4 x 4, dressing sponges, Webril, and Ace wrap applied. Needle and sponge count were correct times 2.The patient awakened, extubated, and taken to recovery  room without difficulty. Vascular status was normal, pulses 2+ and symmetric.   Lorn Junes 06/05/2017, 9:04 AM

## 2017-06-05 NOTE — Progress Notes (Signed)
Orthopedic Tech Progress Note Patient Details:  Debra Simon 24-Mar-1951 [id number]  Patient ID: [name], female   DOB: [birth year], 67 y.o.   MRN: [id number]   [name] 06/05/2017, 10:47 AM Also knee immobilizer to rle

## 2017-06-05 NOTE — Evaluation (Signed)
Physical Therapy Evaluation Patient Details Name: Debra Simon MRN: [id number] DOB: [birth year] Today's Date: 06/05/2017   History of Present Illness  Pt is a 67 y/o female s/p elective R TKA. PMH includes HTN and vertigo.   Clinical Impression  Pt is s/p surgery above with deficits below. Pt presenting with decreased sensation and weakness following surgery in RLE, therefore distance limited to chair. Required min A for steadying assist with RW. Reviewed knee precautions and initiated supine HEP. Will continue to follow acutely to maximize functional mobility independence and safety.     Follow Up Recommendations Follow surgeon's recommendation for DC plan and follow-up therapies;Supervision for mobility/OOB    Equipment Recommendations  3in1 (PT);Rolling walker with 5" wheels    Recommendations for Other Services OT consult     Precautions / Restrictions Precautions Precautions: Knee Precaution Booklet Issued: Yes (comment) Precaution Comments: Reviewed knee precautions and supine ther ex.  Required Braces or Orthoses: Knee Immobilizer - Right Knee Immobilizer - Right: Other (comment)(until discontinued ) Restrictions Weight Bearing Restrictions: Yes RLE Weight Bearing: Weight bearing as tolerated      Mobility  Bed Mobility Overal bed mobility: Needs Assistance Bed Mobility: Supine to Sit     Supine to sit: Min guard     General bed mobility comments: Min guard for steadying assist to come up to sitting.   Transfers Overall transfer level: Needs assistance Equipment used: Rolling walker (2 wheeled) Transfers: Sit to/from Stand Sit to Stand: Min assist         General transfer comment: Min A for lift assist and steadying. Demonstrated safe hand placement.   Ambulation/Gait Ambulation/Gait assistance: Min assist Ambulation Distance (Feet): 5 Feet Assistive device: Rolling walker (2 wheeled) Gait Pattern/deviations: Step-to pattern;Decreased step length -  right;Decreased step length - left;Decreased weight shift to right Gait velocity: Decreased  Gait velocity interpretation: Below normal speed for age/gender General Gait Details: Slow, antalgic gait. Unsteady requiring min A for steadying assist. Verbal cues for sequencing with use of RW.   Stairs            Wheelchair Mobility    Modified Rankin (Stroke Patients Only)       Balance Overall balance assessment: Needs assistance Sitting-balance support: No upper extremity supported;Feet supported Sitting balance-Leahy Scale: Good     Standing balance support: Bilateral upper extremity supported;During functional activity Standing balance-Leahy Scale: Poor Standing balance comment: Reliant on BUE support.                              Pertinent Vitals/Pain Pain Assessment: 0-10 Pain Score: 4  Pain Location: R knee  Pain Descriptors / Indicators: Aching;Operative site guarding Pain Intervention(s): Limited activity within patient's tolerance;Monitored during session;Repositioned    Home Living Family/patient expects to be discharged to:: Private residence Living Arrangements: Spouse/significant other;Children Available Help at Discharge: Family;Available 24 hours/day Type of Home: House Home Access: Stairs to enter Entrance Stairs-Rails: None Entrance Stairs-Number of Steps: 2 Home Layout: Two level;1/2 bath on main level Home Equipment: None      Prior Function Level of Independence: Independent               Hand Dominance   Dominant Hand: Right    Extremity/Trunk Assessment   Upper Extremity Assessment Upper Extremity Assessment: Defer to OT evaluation    Lower Extremity Assessment Lower Extremity Assessment: RLE deficits/detail RLE Deficits / Details: Reports some decreased sensation. Able to perform ther ex below.  Deficits consistent with post op pain and weakness.     Cervical / Trunk Assessment Cervical / Trunk Assessment: Normal   Communication   Communication: Prefers language other than English(daughter/husband can interpret )  Cognition Arousal/Alertness: Awake/alert Behavior During Therapy: WFL for tasks assessed/performed Overall Cognitive Status: Within Functional Limits for tasks assessed                                        General Comments General comments (skin integrity, edema, etc.): Pt's daughter and husband present during session.     Exercises Total Joint Exercises Ankle Circles/Pumps: AROM;Both;20 reps Quad Sets: AROM;Right;15 reps Towel Squeeze: AROM;Both;10 reps Heel Slides: AROM;Right;10 reps   Assessment/Plan    PT Assessment Patient needs continued PT services  PT Problem List Decreased strength;Decreased range of motion;Decreased balance;Decreased mobility;Decreased knowledge of use of DME;Decreased knowledge of precautions;Pain;Impaired sensation       PT Treatment Interventions DME instruction;Gait training;Stair training;Therapeutic activities;Therapeutic exercise;Functional mobility training;Balance training;Neuromuscular re-education;Patient/family education    PT Goals (Current goals can be found in the Care Plan section)  Acute Rehab PT Goals Patient Stated Goal: to go home  PT Goal Formulation: With patient Time For Goal Achievement: 06/19/17 Potential to Achieve Goals: Good    Frequency 7X/week   Barriers to discharge        Co-evaluation               AM-PAC PT "6 Clicks" Daily Activity  Outcome Measure Difficulty turning over in bed (including adjusting bedclothes, sheets and blankets)?: A Little Difficulty moving from lying on back to sitting on the side of the bed? : Unable Difficulty sitting down on and standing up from a chair with arms (e.g., wheelchair, bedside commode, etc,.)?: Unable Help needed moving to and from a bed to chair (including a wheelchair)?: A Little Help needed walking in hospital room?: A Little Help needed  climbing 3-5 steps with a railing? : A Lot 6 Click Score: 13    End of Session Equipment Utilized During Treatment: Gait belt;Right knee immobilizer Activity Tolerance: Patient tolerated treatment well Patient left: in chair;with call bell/phone within reach;with family/visitor present Nurse Communication: Mobility status PT Visit Diagnosis: Unsteadiness on feet (R26.81);Other abnormalities of gait and mobility (R26.89);Pain Pain - Right/Left: Right Pain - part of body: Knee    Time: 1610-1636 PT Time Calculation (min) (ACUTE ONLY): 26 min   Charges:   PT Evaluation $PT Eval Low Complexity: 1 Low PT Treatments $Therapeutic Activity: 8-22 mins   PT G Codes:        Leighton Ruff, PT, DPT  Acute Rehabilitation Services  Pager: [phone]   Debra Simon 06/05/2017, 4:45 PM

## 2017-06-05 NOTE — Transfer of Care (Signed)
Immediate Anesthesia Transfer of Care Note  Patient: Debra Simon  Procedure(s) Performed: RIGHT TOTAL KNEE ARTHROPLASTY (Right Knee)  Patient Location: PACU  Anesthesia Type:Spinal and MAC combined with regional for post-op pain  Level of Consciousness: awake, oriented and patient cooperative  Airway & Oxygen Therapy: Patient Spontanous Breathing and Patient connected to nasal cannula oxygen  Post-op Assessment: Report given to RN, Post -op Vital signs reviewed and stable and Patient moving all extremities  Post vital signs: Reviewed and stable  Last Vitals:  Vitals:   05/24/17 1400 06/05/17 0546  BP: (!) 148/74 (!) 141/66  Pulse: 76 87  Resp:  20  Temp: 36.6 C 36.6 C  SpO2: 97% 98%    Last Pain:  Vitals:   06/05/17 0546  TempSrc: Oral         Complications: No apparent anesthesia complications

## 2017-06-05 NOTE — Anesthesia Procedure Notes (Signed)
Spinal  Patient location during procedure: OR Start time: 06/05/2017 7:24 AM End time: 06/05/2017 7:29 AM Staffing Anesthesiologist: Josephine Igo, MD Performed: anesthesiologist  Preanesthetic Checklist Completed: patient identified, site marked, surgical consent, pre-op evaluation, timeout performed, IV checked, risks and benefits discussed and monitors and equipment checked Spinal Block Patient position: sitting Prep: site prepped and draped and DuraPrep Patient monitoring: heart rate, cardiac monitor, continuous pulse ox and blood pressure Approach: midline Location: L3-4 Injection technique: single-shot Needle Needle type: Pencan  Needle gauge: 24 G Needle length: 9 cm Needle insertion depth: 5.5 cm Assessment Sensory level: T6 Additional Notes Patient tolerated procedure well. Adequate sensory level.

## 2017-06-05 NOTE — Anesthesia Procedure Notes (Addendum)
Anesthesia Regional Block: Adductor canal block   Pre-Anesthetic Checklist: ,, timeout performed, Correct Patient, Correct Site, Correct Laterality, Correct Procedure, Correct Position, site marked, Risks and benefits discussed,  Surgical consent,  Pre-op evaluation,  At surgeon's request and post-op pain management  Laterality: Right  Prep: chloraprep       Needles:  Injection technique: Single-shot  Needle Type: Echogenic Stimulator Needle     Needle Length: 10cm  Needle Gauge: 21   Needle insertion depth: 6 cm   Additional Needles:   Narrative:  Start time: 06/05/2017 7:13 AM End time: 06/05/2017 7:18 AM Injection made incrementally with aspirations every 5 mL.  Performed by: Personally  Anesthesiologist: Josephine Igo, MD  Additional Notes: Timeout performed. Patient sedated. Relevant anatomy ID'd using [place]. Incremental 2-33ml injection of LA with frequent aspiration. Patient tolerated procedure well.       Right Adductor Canal Block

## 2017-06-05 NOTE — Progress Notes (Signed)
Orthopedic Tech Progress Note Patient Details:  Debra Simon [birth year] [id number]  CPM Right Knee CPM Right Knee: On Right Knee Flexion (Degrees): 90 Right Knee Extension (Degrees): 0 Additional Comments: trapeze bar patient helper  Post Interventions Patient Tolerated: Well Instructions Provided: Care of device  [name] 06/05/2017, 10:44 AM cpm and footsie roll to rle; RN notified; RN stated that she would correct this

## 2017-06-05 NOTE — Interval H&P Note (Signed)
History and Physical Interval Note:  06/05/2017 7:03 AM  Debra Simon  has presented today for surgery, with the diagnosis of djd right knee  The various methods of treatment have been discussed with the patient and family. After consideration of risks, benefits and other options for treatment, the patient has consented to  Procedure(s): RIGHT TOTAL KNEE ARTHROPLASTY (Right) as a surgical intervention .  The patient's history has been reviewed, patient examined, no change in status, stable for surgery.  I have reviewed the patient's chart and labs.  Questions were answered to the patient's satisfaction.     Lorn Junes

## 2017-06-06 ENCOUNTER — Encounter (HOSPITAL_COMMUNITY): Payer: Self-pay | Admitting: General Practice

## 2017-06-06 ENCOUNTER — Other Ambulatory Visit: Payer: Self-pay

## 2017-06-06 DIAGNOSIS — I10 Essential (primary) hypertension: Secondary | ICD-10-CM | POA: Diagnosis not present

## 2017-06-06 DIAGNOSIS — K219 Gastro-esophageal reflux disease without esophagitis: Secondary | ICD-10-CM | POA: Diagnosis not present

## 2017-06-06 DIAGNOSIS — E785 Hyperlipidemia, unspecified: Secondary | ICD-10-CM | POA: Diagnosis not present

## 2017-06-06 DIAGNOSIS — M1711 Unilateral primary osteoarthritis, right knee: Secondary | ICD-10-CM | POA: Diagnosis not present

## 2017-06-06 DIAGNOSIS — E559 Vitamin D deficiency, unspecified: Secondary | ICD-10-CM | POA: Diagnosis not present

## 2017-06-06 DIAGNOSIS — E669 Obesity, unspecified: Secondary | ICD-10-CM | POA: Diagnosis not present

## 2017-06-06 LAB — CBC
HEMATOCRIT: 34 % — AB (ref 36.0–46.0)
HEMOGLOBIN: 11 g/dL — AB (ref 12.0–15.0)
MCH: 28.9 pg (ref 26.0–34.0)
MCHC: 32.4 g/dL (ref 30.0–36.0)
MCV: 89.5 fL (ref 78.0–100.0)
Platelets: 182 10*3/uL (ref 150–400)
RBC: 3.8 MIL/uL — ABNORMAL LOW (ref 3.87–5.11)
RDW: 13.5 % (ref 11.5–15.5)
WBC: 12.5 10*3/uL — ABNORMAL HIGH (ref 4.0–10.5)

## 2017-06-06 LAB — BASIC METABOLIC PANEL
Anion gap: 8 (ref 5–15)
BUN: 14 mg/dL (ref 6–20)
CALCIUM: 8.6 mg/dL — AB (ref 8.9–10.3)
CHLORIDE: 105 mmol/L (ref 101–111)
CO2: 25 mmol/L (ref 22–32)
CREATININE: 0.57 mg/dL (ref 0.44–1.00)
GFR calc Af Amer: >60 mL/min (ref >60)
GFR calc non Af Amer: >60 mL/min (ref >60)
GLUCOSE: 163 mg/dL — AB (ref 65–99)
Potassium: 3.7 mmol/L (ref 3.5–5.1)
Sodium: 138 mmol/L (ref 135–145)

## 2017-06-06 MED ORDER — POLYETHYLENE GLYCOL 3350 17 G PO PACK: PACK | ORAL | 0 refills | Status: DC

## 2017-06-06 MED ORDER — APIXABAN 2.5 MG PO TABS: 2.5000 mg | ORAL_TABLET | Freq: Two times a day (BID) | ORAL | 0 refills | Status: DC

## 2017-06-06 MED ORDER — DOCUSATE SODIUM 100 MG PO CAPS: ORAL_CAPSULE | ORAL | 0 refills | Status: DC

## 2017-06-06 MED ORDER — OXYCODONE HCL 5 MG PO TABS: ORAL_TABLET | ORAL | 0 refills | Status: DC

## 2017-06-06 NOTE — Progress Notes (Signed)
Physical Therapy Treatment Patient Details Name: Debra Simon MRN: [id number] DOB: 09-02-1950 Today's Date: 06/06/2017    History of Present Illness Pt is a 67 y/o female s/p elective R TKA. PMH includes HTN and vertigo.     PT Comments    Patient is making good progress with PT.  From a mobility standpoint anticipate patient will be ready for DC home when medically ready.    Follow Up Recommendations  Follow surgeon's recommendation for DC plan and follow-up therapies;Supervision for mobility/OOB     Equipment Recommendations  3in1 (PT);Rolling walker with 5" wheels    Recommendations for Other Services OT consult     Precautions / Restrictions Precautions Precautions: Knee Precaution Comments: knee precautions reviewed with pt and husband Required Braces or Orthoses: Knee Immobilizer - Right Knee Immobilizer - Right: Other (comment)(until discontinued ) Restrictions Weight Bearing Restrictions: Yes RLE Weight Bearing: Weight bearing as tolerated    Mobility  Bed Mobility               General bed mobility comments: pt OOB in chair upon arrival  Transfers Overall transfer level: Modified independent Equipment used: Rolling walker (2 wheeled) Transfers: Sit to/from Stand              Ambulation/Gait Ambulation/Gait assistance: Supervision   Assistive device: Rolling walker (2 wheeled) Gait Pattern/deviations: Decreased weight shift to right;Step-through pattern Gait velocity: Decreased    General Gait Details: cues for posture intiall; good step through pattern   Stairs Stairs: Yes   Stair Management: No rails;Step to pattern;Backwards;With walker Number of Stairs: 3 General stair comments: cues for sequencing and technique; husband assisted with stabilizing RW  Wheelchair Mobility    Modified Rankin (Stroke Patients Only)       Balance Overall balance assessment: Needs assistance Sitting-balance support: No upper extremity  supported;Feet supported Sitting balance-Leahy Scale: Good     Standing balance support: Bilateral upper extremity supported;During functional activity Standing balance-Leahy Scale: Poor Standing balance comment: Reliant on BUE support.                             Cognition Arousal/Alertness: Awake/alert Behavior During Therapy: WFL for tasks assessed/performed Overall Cognitive Status: Within Functional Limits for tasks assessed                                        Exercises Total Joint Exercises Quad Sets: AROM;Right;10 reps Short Arc Quad: AROM;Right;10 reps Heel Slides: AROM;Right;10 reps Hip ABduction/ADduction: AROM;Right;10 reps Straight Leg Raises: AROM;Right;10 reps Long Arc Quad: AROM;Right;10 reps Knee Flexion: AROM;Right;10 reps;Seated;Other (comment)(10 sec holds) Goniometric ROM: 0-95    General Comments        Pertinent Vitals/Pain Pain Assessment: 0-10 Pain Score: 2  Pain Location: R knee  Pain Descriptors / Indicators: Guarding;Sore Pain Intervention(s): Monitored during session;Premedicated before session;Repositioned    Home Living                      Prior Function            PT Goals (current goals can now be found in the care plan section) Acute Rehab PT Goals Patient Stated Goal: to go home  PT Goal Formulation: With patient Time For Goal Achievement: 06/19/17 Potential to Achieve Goals: Good Progress towards PT goals: Progressing toward goals    Frequency  7X/week      PT Plan Current plan remains appropriate    Co-evaluation              AM-PAC PT "6 Clicks" Daily Activity  Outcome Measure  Difficulty turning over in bed (including adjusting bedclothes, sheets and blankets)?: None Difficulty moving from lying on back to sitting on the side of the bed? : A Little Difficulty sitting down on and standing up from a chair with arms (e.g., wheelchair, bedside commode, etc,.)?: A  Little Help needed moving to and from a bed to chair (including a wheelchair)?: None Help needed walking in hospital room?: None Help needed climbing 3-5 steps with a railing? : A Little 6 Click Score: 21    End of Session Equipment Utilized During Treatment: Gait belt Activity Tolerance: Patient tolerated treatment well Patient left: in chair;with call bell/phone within reach;with family/visitor present Nurse Communication: Mobility status PT Visit Diagnosis: Unsteadiness on feet (R26.81);Other abnormalities of gait and mobility (R26.89);Pain Pain - Right/Left: Right Pain - part of body: Knee     Time: 0830-0900 PT Time Calculation (min) (ACUTE ONLY): 30 min  Charges:  $Gait Training: 8-22 mins $Therapeutic Exercise: 8-22 mins                    G Codes:       Earney Navy, PTA Pager: [phone]     Darliss Cheney 06/06/2017, 10:06 AM

## 2017-06-06 NOTE — Care Management Note (Signed)
Case Management Note  Patient Details  Name: Trenae Brunke MRN: [id number] Date of Birth: [birth year]  Subjective/Objective:    67 yr old female s/p right total knee arthroplasty.                 Action/Plan: Patient was preoperatively setup with [facility], no changes, She will have family support at discharge..    Expected Discharge Date:  06/06/17               Expected Discharge Plan:  [place]  In-House Referral:  NA  Discharge planning Services  CM Consult  Post Acute Care Choice:  Durable Medical Equipment, Home Health Choice offered to:  Patient  DME Arranged:  3-N-1, Walker rolling, CPM DME Agency:  [employer]  HH Arranged:  PT HH Agency:  [facility] [employer]  Status of Service:     If discussed at [employer], dates discussed:    Additional Comments:  Ninfa Meeker, RN 06/06/2017, 11:39 AM

## 2017-06-06 NOTE — Progress Notes (Signed)
[name] to be D/C'd Home per MD order.  Discussed prescriptions and follow up appointments with the patient. Prescriptions given to patient, medication list explained in detail. Pt verbalized understanding.  Allergies as of 06/06/2017   No Known Allergies     Medication List    STOP taking these medications   aspirin EC 81 MG tablet   nitrofurantoin (macrocrystal-monohydrate) 100 MG capsule Commonly known as:  MACROBID     TAKE these medications   acetaminophen 500 MG tablet Commonly known as:  TYLENOL Take 500 mg by mouth 2 (two) times daily as needed (for knee pain.).   apixaban 2.5 MG Tabs tablet Commonly known as:  ELIQUIS Take 1 tablet (2.5 mg total) by mouth every 12 (twelve) hours.   docusate sodium 100 MG capsule Commonly known as:  COLACE 1 tab 2 times a day while on narcotics.  STOOL SOFTENER   losartan-hydrochlorothiazide 50-12.5 MG tablet Commonly known as:  HYZAAR Take 1 tablet by mouth daily.   LUBRICANT EYE DROPS 0.4-0.3 % Soln Generic drug:  Polyethyl Glycol-Propyl Glycol Place 1-2 drops into both eyes 3 (three) times daily as needed (for dry/irritated eyes.).   oxyCODONE 5 MG immediate release tablet Commonly known as:  Oxy IR/ROXICODONE 1 tablets every 4 hrs as needed for pain   polyethylene glycol packet Commonly known as:  MIRALAX / GLYCOLAX 17grams in 6 oz of any drink (water, coffee, juice, soda, tea) twice a day until bowel movement.  LAXITIVE.  Restart if two days since last bowel movement   simvastatin 20 MG tablet Commonly known as:  ZOCOR Take 1 tablet (20 mg total) by mouth at bedtime.            Discharge Care Instructions  (From admission, onward)        Start     Ordered   06/06/17 0000  Change dressing    Comments:  DO NOT REMOVE BANDAGE OVER SURGICAL INCISION.  [place] WHOLE LEG INCLUDING OVER THE WATERPROOF BANDAGE WITH SOAP AND WATER EVERY DAY.   06/06/17 0836      Vitals:   06/06/17 0023 06/06/17 0532  BP: (!)  119/55 120/60  Pulse: (!) 59 63  Resp: 15 16  Temp: 98.2 F (36.8 C) 98.3 F (36.8 C)  SpO2: 99% 97%    Skin clean, dry and intact without evidence of skin break down, no evidence of skin tears noted. Aquacel dressing, clean, dry, and intact with old drainage marked. IV catheter discontinued intact. Site without signs and symptoms of complications. Dressing and pressure applied. Pt denies pain at this time. No complaints noted.  An After Visit Summary and prescriptions were printed and given to the patient. Patient escorted via [place], and D/C home via private auto.  [place] RN

## 2017-06-06 NOTE — Discharge Summary (Signed)
Patient ID: Debra Simon MRN: [id number] DOB/AGE: 1950-07-15 67 y.o.  Admit date: 06/05/2017 Discharge date: 06/06/2017  Admission Diagnoses:  Principal Problem:   Primary localized osteoarthritis of right knee Active Problems:   HTN (hypertension)   Vitamin D deficiency   Lacunar infarction   Diastolic dysfunction, left ventricle   Primary localized osteoarthritis of left knee   Discharge Diagnoses:  Same  Past Medical History:  Diagnosis Date  . Arthritis   . Colon polyps 2010  . Diastolic dysfunction, left ventricle 06/26/2016   (ECHO 2018): Left ventricle: The cavity size was normal. Systolic function was normal. The estimated ejection fraction was in the range of 60% to 65%. Wall motion was normal; there were no regional wall motion abnormalities. Doppler parameters are consistent with abnormal left ventricular relaxation (grade 1 diastolic dysfunction).  . Diverticulosis   . GERD (gastroesophageal reflux disease)    watches what she eats  . Heart murmur    dx last yr.   had echo 05/2016  . Hypertension   . Mild mitral regurgitation 07/04/2016  . Primary localized osteoarthritis of right knee 05/24/2017  . Ureterovaginal fistula   . Uterine fibroid 1994  . Vaginal dysplasia     Surgeries: Procedure(s): RIGHT TOTAL KNEE ARTHROPLASTY on 06/05/2017   Consultants:   Discharged Condition: Improved  Hospital Course: Debra Simon is an 66 y.o. female who was admitted 06/05/2017 for operative treatment ofPrimary localized osteoarthritis of right knee. Patient has severe unremitting pain that affects sleep, daily activities, and work/hobbies. After pre-op clearance the patient was taken to the operating room on 06/05/2017 and underwent  Procedure(s): RIGHT TOTAL KNEE ARTHROPLASTY.    Patient was given perioperative antibiotics:  Anti-infectives (From admission, onward)   Start     Dose/Rate Route Frequency Ordered Stop   06/05/17 1200  ceFAZolin (ANCEF) IVPB 2g/100 mL premix      2 g 200 mL/hr over 30 Minutes Intravenous Every 6 hours 06/05/17 1145 06/05/17 1725   06/05/17 0812  tobramycin (NEBCIN) powder  Status:  Discontinued       As needed 06/05/17 0813 06/05/17 0950   06/05/17 0645  cefTRIAXone (ROCEPHIN) 1 g in sodium chloride 0.9 % 100 mL IVPB     1 g 200 mL/hr over 30 Minutes Intravenous  Once 06/05/17 0635 06/05/17 0750   06/05/17 0637  ceFAZolin (ANCEF) 2-4 GM/100ML-% IVPB    Comments:  Scronce, Trina   : cabinet override      06/05/17 0637 06/05/17 0733   06/05/17 0634  ceFAZolin (ANCEF) IVPB 2g/100 mL premix     2 g 200 mL/hr over 30 Minutes Intravenous On call to O.R. 06/05/17 2297 06/05/17 0733       Patient was given sequential compression devices, early ambulation, and chemoprophylaxis to prevent DVT.  Patient benefited maximally from hospital stay and there were no complications.    Recent vital signs:  Patient Vitals for the past 24 hrs:  BP Temp Temp src Pulse Resp SpO2  06/06/17 0532 120/60 98.3 F (36.8 C) Oral 63 16 97 %  06/06/17 0023 (!) 119/55 98.2 F (36.8 C) Oral (!) 59 15 99 %  06/05/17 1925 (!) 127/59 98.2 F (36.8 C) Oral 71 17 98 %  06/05/17 1500 (!) 133/53 98 F (36.7 C) Oral 68 17 97 %  06/05/17 1106 (!) 133/59 (!) 97.4 F (36.3 C) - 73 16 97 %  06/05/17 1042 119/84 - - 66 19 100 %  06/05/17 1027 133/64 - - 66 11  99 %  06/05/17 1012 114/61 - - 68 14 100 %  06/05/17 0957 119/70 - - 74 15 99 %  06/05/17 0942 117/71 (!) 97.3 F (36.3 C) - 83 12 100 %     Recent laboratory studies:  Recent Labs    06/06/17 0404  WBC 12.5*  HGB 11.0*  HCT 34.0*  PLT 182  NA 138  K 3.7  CL 105  CO2 25  BUN 14  CREATININE 0.57  GLUCOSE 163*  CALCIUM 8.6*     Discharge Medications:   Allergies as of 06/06/2017   No Known Allergies     Medication List    STOP taking these medications   aspirin EC 81 MG tablet   nitrofurantoin (macrocrystal-monohydrate) 100 MG capsule Commonly known as:  MACROBID     TAKE these  medications   acetaminophen 500 MG tablet Commonly known as:  TYLENOL Take 500 mg by mouth 2 (two) times daily as needed (for knee pain.).   apixaban 2.5 MG Tabs tablet Commonly known as:  ELIQUIS Take 1 tablet (2.5 mg total) by mouth every 12 (twelve) hours.   docusate sodium 100 MG capsule Commonly known as:  COLACE 1 tab 2 times a day while on narcotics.  STOOL SOFTENER   losartan-hydrochlorothiazide 50-12.5 MG tablet Commonly known as:  HYZAAR Take 1 tablet by mouth daily.   LUBRICANT EYE DROPS 0.4-0.3 % Soln Generic drug:  Polyethyl Glycol-Propyl Glycol Place 1-2 drops into both eyes 3 (three) times daily as needed (for dry/irritated eyes.).   oxyCODONE 5 MG immediate release tablet Commonly known as:  Oxy IR/ROXICODONE 1 tablets every 4 hrs as needed for pain   polyethylene glycol packet Commonly known as:  MIRALAX / GLYCOLAX 17grams in 6 oz of any drink (water, coffee, juice, soda, tea) twice a day until bowel movement.  LAXITIVE.  Restart if two days since last bowel movement   simvastatin 20 MG tablet Commonly known as:  ZOCOR Take 1 tablet (20 mg total) by mouth at bedtime.            Discharge Care Instructions  (From admission, onward)        Start     Ordered   06/06/17 0000  Change dressing    Comments:  DO NOT REMOVE BANDAGE OVER SURGICAL INCISION.  [place] WHOLE LEG INCLUDING OVER THE WATERPROOF BANDAGE WITH SOAP AND WATER EVERY DAY.   06/06/17 0836      Diagnostic Studies: No results found.  Disposition: 01-Home or Self Care  Discharge Instructions    CPM   Complete by:  As directed    Continuous passive motion machine (CPM):      Use the CPM from 0 to 90 for 6 hours per day.       You may break it up into 2 or 3 sessions per day.      Use CPM for 2 weeks or until you are told to stop.   Call MD / Call 911   Complete by:  As directed    If you experience chest pain or shortness of breath, CALL 911 and be transported to the hospital  emergency room.  If you develope a fever above 101 F, pus (white drainage) or increased drainage or redness at the wound, or calf pain, call your surgeon's office.   Change dressing   Complete by:  As directed    DO NOT REMOVE BANDAGE OVER SURGICAL INCISION.  [place] WHOLE LEG INCLUDING OVER THE WATERPROOF  BANDAGE WITH SOAP AND WATER EVERY DAY.   Constipation Prevention   Complete by:  As directed    Drink plenty of fluids.  Prune juice may be helpful.  You may use a stool softener, such as Colace (over the counter) 100 mg twice a day.  Use MiraLax (over the counter) for constipation as needed.   Diet - low sodium heart healthy   Complete by:  As directed    Discharge instructions   Complete by:  As directed    INSTRUCTIONS AFTER JOINT REPLACEMENT   Remove items at home which could result in a fall. This includes throw rugs or furniture in walking pathways ICE to the affected joint every three hours while awake for 30 minutes at a time, for at least the first 3-5 days, and then as needed for pain and swelling.  Continue to use ice for pain and swelling. You may notice swelling that will progress down to the foot and ankle.  This is normal after surgery.  Elevate your leg when you are not up walking on it.   Continue to use the breathing machine you got in the hospital (incentive spirometer) which will help keep your temperature down.  It is common for your temperature to cycle up and down following surgery, especially at night when you are not up moving around and exerting yourself.  The breathing machine keeps your lungs expanded and your temperature down.   DIET:  As you were doing prior to hospitalization, we recommend a well-balanced diet.  DRESSING / WOUND CARE / SHOWERING  Keep the surgical dressing until follow up.  The dressing is water proof, so you can shower without any extra covering.  IF THE DRESSING FALLS OFF or the wound gets wet inside, change the dressing with sterile gauze.   Please use good hand washing techniques before changing the dressing.  Do not use any lotions or creams on the incision until instructed by your surgeon.    ACTIVITY  Increase activity slowly as tolerated, but follow the weight bearing instructions below.   No driving for 6 weeks or until further direction given by your physician.  You cannot drive while taking narcotics.  No lifting or carrying greater than 10 lbs. until further directed by your surgeon. Avoid periods of inactivity such as sitting longer than an hour when not asleep. This helps prevent blood clots.  You may return to work once you are authorized by your doctor.     WEIGHT BEARING   Weight bearing as tolerated with assist device (walker, cane, etc) as directed, use it as long as suggested by your surgeon or therapist, typically at least 2-3 weeks.   EXERCISES  Results after joint replacement surgery are often greatly improved when you follow the exercise, range of motion and muscle strengthening exercises prescribed by your doctor. Safety measures are also important to protect the joint from further injury. Any time any of these exercises cause you to have increased pain or swelling, decrease what you are doing until you are comfortable again and then slowly increase them. If you have problems or questions, call your caregiver or physical therapist for advice.   Rehabilitation is important following a joint replacement. After just a few days of immobilization, the muscles of the leg can become weakened and shrink (atrophy).  These exercises are designed to build up the tone and strength of the thigh and leg muscles and to improve motion. Often times heat used for twenty to thirty  minutes before working out will loosen up your tissues and help with improving the range of motion but do not use heat for the first two weeks following surgery (sometimes heat can increase post-operative swelling).   These exercises can be done on a  training (exercise) mat, on the floor, on a table or on a bed. Use whatever works the best and is most comfortable for you.    Use music or television while you are exercising so that the exercises are a pleasant break in your day. This will make your life better with the exercises acting as a break in your routine that you can look forward to.   Perform all exercises about fifteen times, three times per day or as directed.  You should exercise both the operative leg and the other leg as well.   Exercises include:  Quad Sets - Tighten up the muscle on the front of the thigh (Quad) and hold for 5-10 seconds.   Straight Leg Raises - With your knee straight (if you were given a brace, keep it on), lift the leg to 60 degrees, hold for 3 seconds, and slowly lower the leg.  Perform this exercise against resistance later as your leg gets stronger.  Leg Slides: Lying on your back, slowly slide your foot toward your buttocks, bending your knee up off the floor (only go as far as is comfortable). Then slowly slide your foot back down until your leg is flat on the floor again.  Angel Wings: Lying on your back spread your legs to the side as far apart as you can without causing discomfort.  Hamstring Strength:  Lying on your back, push your heel against the floor with your leg straight by tightening up the muscles of your buttocks.  Repeat, but this time bend your knee to a comfortable angle, and push your heel against the floor.  You may put a pillow under the heel to make it more comfortable if necessary.   A rehabilitation program following joint replacement surgery can speed recovery and prevent re-injury in the future due to weakened muscles. Contact your doctor or a physical therapist for more information on knee rehabilitation.    CONSTIPATION  Constipation is defined medically as fewer than three stools per week and severe constipation as less than one stool per week.  Even if you have a regular bowel  pattern at home, your normal regimen is likely to be disrupted due to multiple reasons following surgery.  Combination of anesthesia, postoperative narcotics, change in appetite and fluid intake all can affect your bowels.   YOU MUST use at least one of the following options; they are listed in order of increasing strength to get the job done.  They are all available over the counter, and you may need to use some, POSSIBLY even all of these options:    Drink plenty of fluids (prune juice may be helpful) and high fiber foods Colace 100 mg by mouth twice a day  Senokot for constipation as directed and as needed Dulcolax (bisacodyl), take with full glass of water  Miralax (polyethylene glycol) once or twice a day as needed.  If you have tried all these things and are unable to have a bowel movement in the first 3-4 days after surgery call either your surgeon or your primary doctor.    If you experience loose stools or diarrhea, hold the medications until you stool forms back up.  If your symptoms do not get better within  1 week or if they get worse, check with your doctor.  If you experience "the worst abdominal pain ever" or develop nausea or vomiting, please contact the office immediately for further recommendations for treatment.   ITCHING:  If you experience itching with your medications, try taking only a single pain pill, or even half a pain pill at a time.  You can also use Benadryl over the counter for itching or also to help with sleep.   TED HOSE STOCKINGS:  Use stockings on both legs until for at least 2 weeks or as directed by physician office. They may be removed at night for sleeping.  MEDICATIONS:  See your medication summary on the "After Visit Summary" that nursing will review with you.  You may have some home medications which will be placed on hold until you complete the course of blood thinner medication.  It is important for you to complete the blood thinner medication as  prescribed.  PRECAUTIONS:  If you experience chest pain or shortness of breath - call 911 immediately for transfer to the hospital emergency department.   If you develop a fever greater that 101 F, purulent drainage from wound, increased redness or drainage from wound, foul odor from the wound/dressing, or calf pain - CONTACT YOUR SURGEON.                                                   FOLLOW-UP APPOINTMENTS:  If you do not already have a post-op appointment, please call the office for an appointment to be seen by your surgeon.  Guidelines for how soon to be seen are listed in your "After Visit Summary", but are typically between 1-4 weeks after surgery.  OTHER INSTRUCTIONS:   Knee Replacement:  Do not place pillow under knee, focus on keeping the knee straight while resting. CPM instructions: 0-90 degrees, 2 hours in the morning, 2 hours in the afternoon, and 2 hours in the evening. Place foam block, curve side up under heel at all times except when in CPM or when walking.  DO NOT modify, tear, cut, or change the foam block in any way.  MAKE SURE YOU:  Understand these instructions.  Get help right away if you are not doing well or get worse.    Thank you for letting us be a part of your medical care team.  It is a privilege we respect greatly.  We hope these instructions will help you stay on track for a fast and full recovery!   Do not put a pillow under the knee. Place it under the heel.   Complete by:  As directed    Place gray foam block, curve side up under heel at all times except when in CPM or when walking.  DO NOT modify, tear, cut, or change in any way the gray foam block.   Increase activity slowly as tolerated   Complete by:  As directed    Patient may shower   Complete by:  As directed    Aquacel dressing is water proof    Wash over it and the whole leg with soap and water at the end of your shower   TED hose   Complete by:  As directed    Use stockings (TED hose) for 2  weeks on both leg(s).  You may remove them  at night for sleeping.      Follow-up Information    Elsie Saas, MD Follow up on 06/19/2017.   Specialty:  Orthopedic Surgery Why:  appointment with Dr Noemi Chapel at 2 pm Contact information: [place] [address] [phone]        [facility] Follow up on 06/20/2017.   Specialty:  Orthopedic Surgery Why:  physical therapy appointment arrive at 9:30 for 10 am appointment.  2nd appointment for PT is on 3/28 at 10 am. Contact information: [place] [address] [phone]            Signed: Linda Hedges 06/06/2017, 8:40 AM

## 2017-06-06 NOTE — Progress Notes (Signed)
Patient would like to keep foley until after working with PT this morning.

## 2017-06-19 DIAGNOSIS — M1711 Unilateral primary osteoarthritis, right knee: Secondary | ICD-10-CM | POA: Diagnosis not present

## 2017-06-19 DIAGNOSIS — R350 Frequency of micturition: Secondary | ICD-10-CM | POA: Diagnosis not present

## 2017-06-20 DIAGNOSIS — M25561 Pain in right knee: Secondary | ICD-10-CM | POA: Diagnosis not present

## 2017-06-20 DIAGNOSIS — Z471 Aftercare following joint replacement surgery: Secondary | ICD-10-CM | POA: Diagnosis not present

## 2017-06-20 DIAGNOSIS — M25661 Stiffness of right knee, not elsewhere classified: Secondary | ICD-10-CM | POA: Diagnosis not present

## 2017-06-20 DIAGNOSIS — M1711 Unilateral primary osteoarthritis, right knee: Secondary | ICD-10-CM | POA: Diagnosis not present

## 2017-07-21 ENCOUNTER — Ambulatory Visit: Payer: Managed Care, Other (non HMO) | Admitting: Family Medicine

## 2017-07-27 DIAGNOSIS — M25661 Stiffness of right knee, not elsewhere classified: Secondary | ICD-10-CM | POA: Diagnosis not present

## 2017-07-27 DIAGNOSIS — M1711 Unilateral primary osteoarthritis, right knee: Secondary | ICD-10-CM | POA: Diagnosis not present

## 2017-07-27 DIAGNOSIS — Z471 Aftercare following joint replacement surgery: Secondary | ICD-10-CM | POA: Diagnosis not present

## 2017-07-27 DIAGNOSIS — M25561 Pain in right knee: Secondary | ICD-10-CM | POA: Diagnosis not present

## 2017-08-01 DIAGNOSIS — M25561 Pain in right knee: Secondary | ICD-10-CM | POA: Diagnosis not present

## 2017-08-01 DIAGNOSIS — Z471 Aftercare following joint replacement surgery: Secondary | ICD-10-CM | POA: Diagnosis not present

## 2017-08-01 DIAGNOSIS — M25661 Stiffness of right knee, not elsewhere classified: Secondary | ICD-10-CM | POA: Diagnosis not present

## 2017-08-01 DIAGNOSIS — M1711 Unilateral primary osteoarthritis, right knee: Secondary | ICD-10-CM | POA: Diagnosis not present

## 2017-08-08 DIAGNOSIS — M25661 Stiffness of right knee, not elsewhere classified: Secondary | ICD-10-CM | POA: Diagnosis not present

## 2017-08-08 DIAGNOSIS — Z471 Aftercare following joint replacement surgery: Secondary | ICD-10-CM | POA: Diagnosis not present

## 2017-08-08 DIAGNOSIS — M25561 Pain in right knee: Secondary | ICD-10-CM | POA: Diagnosis not present

## 2017-08-08 DIAGNOSIS — M1711 Unilateral primary osteoarthritis, right knee: Secondary | ICD-10-CM | POA: Diagnosis not present

## 2017-08-28 DIAGNOSIS — M1711 Unilateral primary osteoarthritis, right knee: Secondary | ICD-10-CM | POA: Diagnosis not present

## 2017-11-03 ENCOUNTER — Ambulatory Visit: Payer: Managed Care, Other (non HMO) | Admitting: Obstetrics & Gynecology

## 2017-12-22 ENCOUNTER — Ambulatory Visit: Payer: Medicare Other | Admitting: Family Medicine

## 2017-12-26 ENCOUNTER — Encounter: Payer: Self-pay | Admitting: Family Medicine

## 2017-12-26 ENCOUNTER — Ambulatory Visit (INDEPENDENT_AMBULATORY_CARE_PROVIDER_SITE_OTHER): Payer: Medicare HMO | Admitting: Family Medicine

## 2017-12-26 VITALS — BP 136/80 | HR 78 | Temp 98.7°F | Ht 61.25 in | Wt 173.4 lb

## 2017-12-26 DIAGNOSIS — Z96651 Presence of right artificial knee joint: Secondary | ICD-10-CM

## 2017-12-26 DIAGNOSIS — K429 Umbilical hernia without obstruction or gangrene: Secondary | ICD-10-CM | POA: Diagnosis not present

## 2017-12-26 DIAGNOSIS — E785 Hyperlipidemia, unspecified: Secondary | ICD-10-CM | POA: Diagnosis not present

## 2017-12-26 DIAGNOSIS — I10 Essential (primary) hypertension: Secondary | ICD-10-CM

## 2017-12-26 DIAGNOSIS — E786 Lipoprotein deficiency: Secondary | ICD-10-CM

## 2017-12-26 DIAGNOSIS — K5904 Chronic idiopathic constipation: Secondary | ICD-10-CM | POA: Diagnosis not present

## 2017-12-26 DIAGNOSIS — Z23 Encounter for immunization: Secondary | ICD-10-CM | POA: Diagnosis not present

## 2017-12-26 LAB — COMPREHENSIVE METABOLIC PANEL
ALT: 17 U/L (ref 0–35)
AST: 20 U/L (ref 0–37)
Albumin: 4.4 g/dL (ref 3.5–5.2)
Alkaline Phosphatase: 68 U/L (ref 39–117)
BUN: 14 mg/dL (ref 6–23)
CO2: 32 mEq/L (ref 19–32)
Calcium: 9.5 mg/dL (ref 8.4–10.5)
Chloride: 102 mEq/L (ref 96–112)
Creatinine, Ser: 0.64 mg/dL (ref 0.40–1.20)
GFR: 98.64 mL/min (ref >60.00)
Glucose, Bld: 102 mg/dL — ABNORMAL HIGH (ref 70–99)
Potassium: 3.4 mEq/L — ABNORMAL LOW (ref 3.5–5.1)
Sodium: 140 mEq/L (ref 135–145)
Total Bilirubin: 0.6 mg/dL (ref 0.2–1.2)
Total Protein: 7.4 g/dL (ref 6.0–8.3)

## 2017-12-26 LAB — LIPID PANEL
Cholesterol: 142 mg/dL (ref 0–200)
HDL: 41.6 mg/dL (ref >39.00)
LDL Cholesterol: 84 mg/dL (ref 0–99)
NonHDL: 100.69
Total CHOL/HDL Ratio: 3
Triglycerides: 85 mg/dL (ref 0.0–149.0)
VLDL: 17 mg/dL (ref 0.0–40.0)

## 2017-12-26 LAB — CBC WITH DIFFERENTIAL/PLATELET
Basophils Absolute: 0 10*3/uL (ref 0.0–0.1)
Basophils Relative: 0.2 % (ref 0.0–3.0)
Eosinophils Absolute: 0.2 10*3/uL (ref 0.0–0.7)
Eosinophils Relative: 3.3 % (ref 0.0–5.0)
HCT: 39.7 % (ref 36.0–46.0)
Hemoglobin: 13.4 g/dL (ref 12.0–15.0)
Lymphocytes Relative: 31.3 % (ref 12.0–46.0)
Lymphs Abs: 1.6 10*3/uL (ref 0.7–4.0)
MCHC: 33.8 g/dL (ref 30.0–36.0)
MCV: 88 fl (ref 78.0–100.0)
Monocytes Absolute: 0.4 10*3/uL (ref 0.1–1.0)
Monocytes Relative: 6.8 % (ref 3.0–12.0)
Neutro Abs: 3 10*3/uL (ref 1.4–7.7)
Neutrophils Relative %: 58.4 % (ref 43.0–77.0)
Platelets: 214 10*3/uL (ref 150.0–400.0)
RBC: 4.51 Mil/uL (ref 3.87–5.11)
RDW: 14.2 % (ref 11.5–15.5)
WBC: 5.2 10*3/uL (ref 4.0–10.5)

## 2017-12-26 MED ORDER — LOSARTAN POTASSIUM-HCTZ 50-12.5 MG PO TABS: 1.0000 | ORAL_TABLET | Freq: Every day | ORAL | 3 refills | Status: DC

## 2017-12-26 MED ORDER — TETANUS-DIPHTH-ACELL PERTUSSIS 5-2.5-18.5 LF-MCG/0.5 IM SUSP: 0.5000 mL | Freq: Once | INTRAMUSCULAR | 0 refills | Status: AC

## 2017-12-26 MED ORDER — SIMVASTATIN 20 MG PO TABS
20.0000 mg | ORAL_TABLET | Freq: Every day | ORAL | 3 refills | Status: DC
Start: 2017-12-26 — End: 2020-04-02

## 2017-12-26 NOTE — Patient Instructions (Addendum)
MANAGEMENT OF CHRONIC CONSTIPATION   Push fluids, all day, everyday  Eat lots of high fiber foods-fruits, veggies, bran and whole grain instead of white bread  Be active everyday. Inactivity makes constipation worse.  Add psyllium daily (Metamucil) which comes in capsules now. Start very low dose and work up to recommended dose on bottle daily.  If that is not working, I would start Miralax, which you can buy in generic 17 gms daily. It's a powder and not an "addictive laxative". Take it every day and titrate the dose up or down to get the daily Bm.  Stay away from [place] or any magnesium containing laxative, unless you need it to clear things out rarely. It is an addictive laxative and your gut will become dependent on it.   Marland Kitchen.It takes about 2 weeks for protection to develop after vaccination.  There are many flu viruses, and they are always changing. Each year a new flu vaccine is made to protect against three or four viruses that are likely to cause disease in the upcoming flu season. Even when the vaccine doesn't exactly match these viruses, it may still provide some protection.   Influenza vaccine does not cause flu.  Influenza vaccine may be given at the same time as other vaccines.  3. Talk with your health care provider  Tell your vaccine provider if the person getting the vaccine: ; Has had an allergic reaction after a previous dose of influenza vaccine, or has any severe, life-threatening allergies.  ; Has ever had Guillain-Barr Syndrome (also called GBS).  In some cases, your health care provider may decide to postpone influenza vaccination to a future visit.  People with minor illnesses, such as a cold, may be vaccinated. People who are moderately or severely ill should usually wait until they recover before getting influenza vaccine.  Your health care provider can give you more information.  4. Risks of a reaction  ; Soreness, redness, and swelling where  shot is given, fever, muscle aches, and headache can happen after influenza vaccine. ; There may be a very small increased risk of Guillain-Barr Syndrome (GBS) after inactivated influenza vaccine (the flu shot).  Young children who get the flu shot along with pneumococcal vaccine (PCV13), and/or DTaP vaccine at the same time might be slightly more likely to have a seizure caused by fever. Tell your health care provider if a child who is getting flu vaccine has ever had a seizure.  People sometimes faint after medical procedures, including vaccination. Tell your provider if you feel dizzy or have vision changes or ringing in the ears.  As with any medicine, there is a very remote chance of a vaccine causing a severe allergic reaction, other serious injury, or death.  5. What if there is a serious problem?  An allergic reaction could occur after the vaccinated person leaves the clinic. If you see signs of a severe allergic reaction (hives, swelling of the face and throat, difficulty breathing, a fast heartbeat, dizziness, or weakness), call 9-1-1 and get the person to the nearest hospital.  For other signs that concern you, call your health care provider.  Adverse reactions should be reported to the Vaccine Adverse Event Reporting System (VAERS). Your health care provider will usually file this report, or you can do it yourself. Visit the VAERS website at www.vaers.SamedayNews.es or call (770) 683-9604.  VAERS is only for reporting reactions, and VAERS staff do not give medical advice.  6. The National Vaccine Injury Compensation  Program  The [employer] Injury Compensation Program (VICP) is a federal program that was created to compensate people who may have been injured by certain vaccines. Visit the VICP website at GoldCloset.com.ee or call (334) 164-8477 to learn about the program and about filing a claim. There is a time limit to file a claim for compensation.  7. How can I  learn more?  ; Ask your health care provider.  ; Call your local or state health department. ; Contact the Centers for Disease Control and Prevention (CDC): - Call 540-814-9423 (1-800-CDC-INFO) or - Visit CDC's influenza website at https://gibson.com/  Vaccine Information Statement (Interim) Inactivated Influenza Vaccine  11/09/2017 42 U.S.C.  [phone]   Department of Health and Geneticist, molecular for Disease Control and Prevention  Office Use Only  .Marland KitchenVaccine Information Statement   Pneumococcal Conjugate Vaccine (PCV13): What You Need to Know  Many Vaccine Information Statements are available in Spanish and other languages. See AbsolutelyGenuine.com.br. Hojas de informacin Sobre Vacunas estn disponibles en espaol y en muchos otros idiomas. Visite AbsolutelyGenuine.com.br.  1. Why get vaccinated?  Vaccination can protect both children and adults from pneumococcal disease.  Pneumococcal disease is caused by bacteria that can spread from person to person through close contact.  It can cause ear infections, and it can also lead to more serious infections of the: ; Lungs (pneumonia), ; Blood (bacteremia), and ; Covering of the brain and spinal cord (meningitis). Pneumococcal pneumonia is most common among adults. Pneumococcal meningitis can cause deafness and brain damage, and it kills about 1 child in 10 who get it.  Anyone can get pneumococcal disease, but children under 14 years of age and adults 3 years and older, people with certain medical conditions, and cigarette smokers are at the highest risk.   Before there was a vaccine, the [place] saw: ; more than 700 cases of meningitis, ; about 13,000 blood infections, ; about 5 million ear infections, and ; about 200 deaths in children under 5 each year from pneumococcal disease. Since vaccine became available, severe pneumococcal disease in these children has fallen by 88%.  About 18,000 older adults die of pneumococcal  disease each year in the [place].  Treatment of pneumococcal infections with penicillin and other drugs is not as effective as it used to be, because some strains of the disease have become resistant to these drugs. This makes prevention of the disease, through vaccination, even more important.  2. PCV13 vaccine  Pneumococcal conjugate vaccine (called PCV13) protects against 13 types of pneumococcal bacteria.    PCV13 is routinely given to children at 2, 4, 6, and 83-76 months of age. It is also recommended for children and adults 70 to 2 years of age with certain health conditions, and for all adults 32 years of age and older. Your doctor can give you details.  3. Some people should not get this vaccine  Anyone who has ever had a life-threatening allergic reaction to a dose of this vaccine, to an earlier pneumococcal vaccine called PCV7, or to any vaccine containing diphtheria toxoid (for example, DTaP), should not get PCV13.  Anyone with a severe allergy to any component of PCV13 should not get the vaccine.  Tell your doctor if the person being vaccinated has any severe allergies.  If the person scheduled for vaccination is not feeling well, your healthcare provider might decide to reschedule the shot on another day.   4. Risks of a vaccine reaction  With any medicine, including vaccines,  there is a chance of reactions. These are usually mild and go away on their own, but serious reactions are also possible.   Problems reported following PCV13 varied by age and dose in the series. The most common problems reported among children were:  ; About half became drowsy after the shot, had a temporary loss of appetite, or had redness or tenderness where the shot was given. ; About 1 out of 3 had swelling where the shot was given. ; About 1 out of 3 had a mild fever, and about 1 in 20 had a fever over 102.14F. ; Up to about 8 out of 10 became fussy or irritable.   Adults have reported  pain, redness, and swelling where the shot was given; also mild fever, fatigue, headache, chills, or muscle pain.  Young children who get PCV13 along with inactivated flu vaccine at the same time may be at increased risk for seizures caused by fever. Ask your doctor for more information.   Problems that could happen after any vaccine:  ; People sometimes faint after a medical procedure, including vaccination. Sitting or lying down for about 15 minutes can help prevent fainting, and injuries caused by a fall. Tell your doctor if you feel dizzy, or have vision changes or ringing in the ears.  ; Some older children and adults get severe pain in the shoulder and have difficulty moving the arm where a shot was given. This happens very rarely.  ; Any medication can cause a severe allergic reaction. Such reactions from a vaccine are very rare, estimated at about 1 in a million doses, and would happen within a few minutes to a few hours after the vaccination.   As with any medicine, there is a very small chance of a vaccine causing a serious injury or death.  The safety of vaccines is always being monitored.  For more information, visit: http://www.aguilar.org/   5. What if there is a serious reaction?  What should I look for?  ; Look for anything that concerns you, such as signs of a severe allergic reaction, very high fever, or unusual behavior.  Signs of a severe allergic reaction can include hives, swelling of the face and throat, difficulty breathing, a fast heartbeat, dizziness, and weakness - usually within a few minutes to a few hours after the vaccination.  What should I do?  ; If you think it is a severe allergic reaction or other emergency that can't wait, call 9-1-1 or get the person to the nearest hospital. Otherwise, call your doctor.  Reactions should be reported to the Vaccine Adverse Event Reporting System (VAERS). Your doctor should file this report, or you can do it yourself  through the VAERS web site at www.vaers.SamedayNews.es, or by calling 225 619 9797.  VAERS does not give medical advice.  6. The National Vaccine Injury Compensation Program  The [employer] Vaccine Injury Compensation Program (VICP) is a federal program that was created to compensate people who may have been injured by certain vaccines.  Persons who believe they may have been injured by a vaccine can learn about the program and about filing a claim by calling 915-153-0369 or visiting the [place] website at GoldCloset.com.ee.  There is a time limit to file a claim for compensation.  7. How can I learn more?  ; Ask your healthcare provider. He or she can give you the vaccine package insert or suggest other sources of information. ; Call your local or state health department. ; Contact the  Centers for Disease Control and Prevention (CDC): - Call (215)450-4851 (1-800-CDC-INFO) or - Visit CDC's website at http://hunter.com/  Vaccine Information Statement  MANAGEMENT OF CHRONIC CONSTIPATION   Push fluids, all day, everyday  Eat lots of high fiber foods-fruits, veggies, bran and whole grain instead of white bread  Be active everyday. Inactivity makes constipation worse.  Add psyllium daily (Metamucil) which comes in capsules now. Start very low dose and work up to recommended dose on bottle daily.  If that is not working, I would start Miralax, which you can buy in generic 17 gms daily. It's a powder and not an "addictive laxative". Take it every day and titrate the dose up or down to get the daily Bm.  Stay away from [place] or any magnesium containing laxative, unless you need it to clear things out rarely. It is an addictive laxative and your gut will become dependent on it.   PCV13 Vaccine  01/30/2014  42 U.S.C.  [phone]  Department of Health and Geneticist, molecular for Disease Control and Prevention  Office Use Only

## 2017-12-26 NOTE — Progress Notes (Signed)
Debra Simon is a 67 y.o. female is here for follow up.  History of Present Illness:   Debra Simon, CMA acting as scribe for Dr. Briscoe Deutscher.   HPI: Patient in office for follow up. She has had increased pressure in abdomen around belly button. She feels a hard spot that feels like it has gotten bigger. She has had constipation no problems with eating.   Medication refills: she has been taking medications daily with no side effects. She is post op from right knee replacement so she has been using a stationary bike every day for about an 15 min a day.   Review: taking medications as instructed, no medication side effects noted, no TIAs, no chest pain on exertion, no dyspnea on exertion, no swelling of ankles.  Smoker: No.  Wt Readings from Last 3 Encounters:  12/26/17 173 lb 6.4 oz (78.7 kg)  06/05/17 165 lb (74.8 kg)  05/25/17 165 lb (74.8 kg)   BP Readings from Last 3 Encounters:  12/26/17 136/80  06/06/17 120/60  05/25/17 (!) 158/93   Lab Results  Component Value Date   CREATININE 0.57 06/06/2017   Health Maintenance Due  Topic Date Due  . TETANUS/TDAP  11/11/1970  . PNA vac Low Risk Adult (1 of 2 - PCV13) 11/10/2016   Depression screen PHQ 2/9 12/26/2017 11/16/2016  Decreased Interest 0 0  Down, Depressed, Hopeless 0 0  PHQ - 2 Score 0 0   PMHx, SurgHx, SocialHx, FamHx, Medications, and Allergies were reviewed in the Visit Navigator and updated as appropriate.   Patient Active Problem List   Diagnosis Date Noted  . Primary localized osteoarthritis of left knee 06/05/2017  . Primary localized osteoarthritis of right knee 05/24/2017  . Diverticulosis   . Hematuria 03/14/2017  . Arthritis of right knee 11/16/2016  . History of vaginal dysplasia 08/31/2016  . Mild mitral regurgitation 07/04/2016  . Diastolic dysfunction, left ventricle 06/26/2016  . Benign paroxysmal vertigo 06/02/2016  . Lacunar infarction 06/02/2016  . Hyperlipidemia with low HDL  06/02/2016  . Vaginal dysplasia 08/28/2015  . Sensorineural hearing loss (SNHL), bilateral 07/07/2015  . Tinnitus of both ears 07/07/2015  . Rectocele 05/30/2014  . Obesity 07/31/2013  . HTN (hypertension) 07/31/2013  . Vitamin D deficiency 07/31/2013  . Hyperglycemia 07/31/2013  . Vaginal atrophy 05/24/2013  . Personal history of colonic polyps 05/24/2013   Social History   Tobacco Use  . Smoking status: Never Smoker  . Smokeless tobacco: Never Used  Substance Use Topics  . Alcohol use: No    Alcohol/week: 0.0 standard drinks    Comment: rare on holidays  . Drug use: No   Current Medications and Allergies:   Current Outpatient Medications:  .  acetaminophen (TYLENOL) 500 MG tablet, Take 500 mg by mouth 2 (two) times daily as needed (for knee pain.)., Disp: , Rfl:  .  losartan-hydrochlorothiazide (HYZAAR) 50-12.5 MG tablet, Take 1 tablet by mouth daily., Disp: 90 tablet, Rfl: 3 .  simvastatin (ZOCOR) 20 MG tablet, Take 1 tablet (20 mg total) by mouth at bedtime., Disp: 90 tablet, Rfl: 3 .  Tdap (BOOSTRIX) 5-2.5-18.5 LF-MCG/0.5 injection, Inject 0.5 mLs into the muscle once for 1 dose., Disp: 0.5 mL, Rfl: 0  No Known Allergies Review of Systems   Pertinent items are noted in the HPI. Otherwise, ROS is negative.  Vitals:   Vitals:   12/26/17 0930  BP: 136/80  Pulse: 78  Temp: 98.7 F (37.1 C)  TempSrc: Oral  SpO2:  97%  Weight: 173 lb 6.4 oz (78.7 kg)  Height: 5' 1.25" (1.556 m)     Body mass index is 32.5 kg/m.  Physical Exam:   Physical Exam  Constitutional: She is oriented to person, place, and time. She appears well-developed and well-nourished. No distress.  HENT:  Head: Normocephalic and atraumatic.  Right Ear: External ear normal.  Left Ear: External ear normal.  Nose: Nose normal.  Mouth/Throat: Oropharynx is clear and moist.  Eyes: Pupils are equal, round, and reactive to light. Conjunctivae and EOM are normal.  Neck: Normal range of motion. Neck  supple.  Cardiovascular: Normal rate, regular rhythm and intact distal pulses.  Murmur heard. Pulmonary/Chest: Effort normal and breath sounds normal.  Abdominal: Soft. Bowel sounds are normal.    Neurological: She is alert and oriented to person, place, and time. She displays normal reflexes. No cranial nerve deficit or sensory deficit. She exhibits normal muscle tone. Coordination normal.  Skin: Skin is warm.  Psychiatric: She has a normal mood and affect. Her behavior is normal.  Nursing note and vitals reviewed.  Assessment and Plan:   Debra Simon was seen today for medication refill.  Diagnoses and all orders for this visit:  Hyperlipidemia with low HDL Comments: Compliant and due for FLP. Orders: -     Lipid panel -     simvastatin (ZOCOR) 20 MG tablet; Take 1 tablet (20 mg total) by mouth at bedtime.  Essential hypertension Comments: Doing well. Continue current treatment.  Orders: -     losartan-hydrochlorothiazide (HYZAAR) 50-12.5 MG tablet; Take 1 tablet by mouth daily. -     CBC with Differential/Platelet -     Comprehensive metabolic panel  Need for pneumococcal vaccine -     Pneumococcal conjugate vaccine 13-valent  Encounter for immunization -     Flu vaccine HIGH DOSE PF  Chronic idiopathic constipation Comments: SEE AVS.  Umbilical hernia without obstruction and without gangrene Comments: Mild. No pain. Discussed bowel regimen, precuations, and red flags.  Status post right knee replacement Comments: Doing very well. Rehab still happening.   Need for diphtheria-tetanus-pertussis (Tdap) vaccine -     Tdap (BOOSTRIX) 5-2.5-18.5 LF-MCG/0.5 injection; Inject 0.5 mLs into the muscle once for 1 dose.  Needs flu shot -     Flu vaccine HIGH DOSE PF   . Reviewed expectations re: course of current medical issues. . Discussed self-management of symptoms. . Outlined signs and symptoms indicating need for more acute intervention. . Patient verbalized  understanding and all questions were answered. Marland Kitchen Health Maintenance issues including appropriate healthy diet, exercise, and smoking avoidance were discussed with patient. . See orders for this visit as documented in the electronic medical record. . Patient received an After Visit Summary.  CMA served as Education administrator during this visit. History, Physical, and Plan performed by medical provider. The above documentation has been reviewed and is accurate and complete. Briscoe Deutscher, D.O.  Briscoe Deutscher, DO [place], [place] 12/26/2017

## 2018-03-27 ENCOUNTER — Encounter: Payer: Managed Care, Other (non HMO) | Admitting: Obstetrics & Gynecology

## 2018-04-25 ENCOUNTER — Other Ambulatory Visit: Payer: Self-pay | Admitting: Obstetrics & Gynecology

## 2018-04-25 DIAGNOSIS — Z1231 Encounter for screening mammogram for malignant neoplasm of breast: Secondary | ICD-10-CM

## 2018-05-25 ENCOUNTER — Ambulatory Visit
Admission: RE | Admit: 2018-05-25 | Discharge: 2018-05-25 | Disposition: A | Discharge status: Home / Self Care | Payer: Medicare HMO | Source: Ambulatory Visit | Attending: Obstetrics & Gynecology | Admitting: Obstetrics & Gynecology

## 2018-05-25 DIAGNOSIS — Z1231 Encounter for screening mammogram for malignant neoplasm of breast: Secondary | ICD-10-CM | POA: Diagnosis not present

## 2018-06-13 ENCOUNTER — Encounter: Payer: Managed Care, Other (non HMO) | Admitting: Obstetrics & Gynecology

## 2018-06-19 ENCOUNTER — Other Ambulatory Visit: Payer: Self-pay

## 2018-06-20 ENCOUNTER — Ambulatory Visit: Payer: Medicare HMO | Admitting: Obstetrics & Gynecology

## 2018-06-20 ENCOUNTER — Encounter: Payer: Self-pay | Admitting: Obstetrics & Gynecology

## 2018-06-20 VITALS — BP 132/76 | Ht 60.0 in | Wt 171.0 lb

## 2018-06-20 DIAGNOSIS — Z87411 Personal history of vaginal dysplasia: Secondary | ICD-10-CM

## 2018-06-20 DIAGNOSIS — Z01419 Encounter for gynecological examination (general) (routine) without abnormal findings: Secondary | ICD-10-CM

## 2018-06-20 DIAGNOSIS — Z9189 Other specified personal risk factors, not elsewhere classified: Secondary | ICD-10-CM | POA: Diagnosis not present

## 2018-06-20 DIAGNOSIS — E6609 Other obesity due to excess calories: Secondary | ICD-10-CM

## 2018-06-20 DIAGNOSIS — Z90710 Acquired absence of both cervix and uterus: Secondary | ICD-10-CM

## 2018-06-20 DIAGNOSIS — Z780 Asymptomatic menopausal state: Secondary | ICD-10-CM

## 2018-06-20 DIAGNOSIS — Z6833 Body mass index (BMI) 33.0-33.9, adult: Secondary | ICD-10-CM

## 2018-06-20 DIAGNOSIS — R87620 Atypical squamous cells of undetermined significance on cytologic smear of vagina (ASC-US): Secondary | ICD-10-CM | POA: Diagnosis not present

## 2018-06-20 DIAGNOSIS — Z1272 Encounter for screening for malignant neoplasm of vagina: Secondary | ICD-10-CM

## 2018-06-20 NOTE — Addendum Note (Signed)
Addended by: Thurnell Garbe A on: 06/20/2018 01:58 PM   Modules accepted: Orders

## 2018-06-20 NOTE — Progress Notes (Signed)
Debra Simon [birth year] [id number]   History:    68 y.o. [id number] Separated  RP:  Established patient presenting for annual gyn exam   HPI:  S/P Total Hysterectomy.  Postmenopausal, well on no hormone replacement therapy.  No pelvic pain.  Abstinent.  Urine and bowel movements normal.  Breasts normal.  Body mass index 33.4.  Walking.  Had a right knee replacement last year.  Health labs with family physician.  Past medical history,surgical history, family history and social history were all reviewed and documented in the EPIC chart.  Gynecologic History No LMP recorded. Patient has had a hysterectomy. Contraception: status post hysterectomy Last Pap: 02/2017. Results were: ASCUS/HPV HR pos.  HPV 16-18-45 neg. Last mammogram: 04/2018. Results were: Negative Bone Density: 03/2017 Normal  Colonoscopy: 3 yrs ago per patient.  Every 3 yrs recommended.  Obstetric History OB History  Gravida Para Term Preterm AB Living  7 4     3 4   SAB TAB Ectopic Multiple Live Births  3            # Outcome Date GA Lbr Len/2nd Weight Sex Delivery Anes PTL Lv  7 SAB           6 SAB           5 SAB           4 Para           3 Para           2 Para           1 Para              ROS: A ROS was performed and pertinent positives and negatives are included in the history.  GENERAL: No fevers or chills. HEENT: No change in vision, no earache, sore throat or sinus congestion. NECK: No pain or stiffness. CARDIOVASCULAR: No chest pain or pressure. No palpitations. PULMONARY: No shortness of breath, cough or wheeze. GASTROINTESTINAL: No abdominal pain, nausea, vomiting or diarrhea, melena or bright red blood per rectum. GENITOURINARY: No urinary frequency, urgency, hesitancy or dysuria. MUSCULOSKELETAL: No joint or muscle pain, no back pain, no recent trauma. DERMATOLOGIC: No rash, no itching, no lesions. ENDOCRINE: No polyuria, polydipsia, no heat or cold intolerance. No recent change in weight.  HEMATOLOGICAL: No anemia or easy bruising or bleeding. NEUROLOGIC: No headache, seizures, numbness, tingling or weakness. PSYCHIATRIC: No depression, no loss of interest in normal activity or change in sleep pattern.     Exam:   BP 132/76   Ht 5' (1.524 m)   Wt 171 lb (77.6 kg)   BMI 33.40 kg/m   Body mass index is 33.4 kg/m.  General appearance : Well developed well nourished female. No acute distress HEENT: Eyes: no retinal hemorrhage or exudates,  Neck supple, trachea midline, no carotid bruits, no thyroidmegaly Lungs: Clear to auscultation, no rhonchi or wheezes, or rib retractions  Heart: Regular rate and rhythm, no murmurs or gallops Breast:Examined in sitting and supine position were symmetrical in appearance, no palpable masses or tenderness,  no skin retraction, no nipple inversion, no nipple discharge, no skin discoloration, no axillary or supraclavicular lymphadenopathy Abdomen: no palpable masses or tenderness, no rebound or guarding Extremities: no edema or skin discoloration or tenderness  Pelvic: Vulva: Normal             Vagina: No gross lesions or discharge.  Pap reflex done.  Cervix/Uterus absent  Adnexa  Without masses or tenderness  Anus: Normal   Assessment/Plan:  68 y.o. female for annual exam   1. Encounter for Papanicolaou smear of vagina as part of routine gynecological examination Gynecologic exam status post total hysterectomy and menopause.  Pap reflex done on the vaginal vault.  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  Breasts normal.  Screening mammo negative 04/2018.  Health labs with Fam MD.  Will refer to [facility] for Screening Colono.  2. S/P total hysterectomy  3. Postmenopausal Well on no HRT.  Bone density 03/2017 normal.  Repeat BD at 5 yrs.  Vitamin D supplements, calcium intake total of 1.5 g/day and regular weightbearing physical activities.  4. Class 1 obesity due to excess calories without serious comorbidity with body mass  index (BMI) of 33.0 to 33.9 in adult Recommend lower calorie/carb diet such as [employer] and aerobic physical activities 5 times a week with weightlifting every 2 days.   Princess Bruins MD, 12:27 PM 06/20/2018

## 2018-06-20 NOTE — Patient Instructions (Addendum)
1. Encounter for Papanicolaou smear of vagina as part of routine gynecological examination Gynecologic exam status post total hysterectomy and menopause.  Pap reflex done on the vaginal vault.  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  Breasts normal.  Screening mammo negative 04/2018.  Health labs with Fam MD.  Will refer to [facility] for Screening Colono.  2. S/P total hysterectomy  3. Postmenopausal Well on no HRT.  Bone density 03/2017 normal.  Repeat BD at 5 yrs.  Vitamin D supplements, calcium intake total of 1.5 g/day and regular weightbearing physical activities.  4. Class 1 obesity due to excess calories without serious comorbidity with body mass index (BMI) of 33.0 to 33.9 in adult Recommend lower calorie/carb diet such as [employer] and aerobic physical activities 5 times a week with weightlifting every 2 days.  Corliss Parish, fue un placer verle hoy!  Voy a informarle de sus [employer].

## 2018-06-22 ENCOUNTER — Telehealth: Payer: Self-pay | Admitting: *Deleted

## 2018-06-22 DIAGNOSIS — Z1211 Encounter for screening for malignant neoplasm of colon: Secondary | ICD-10-CM

## 2018-06-22 LAB — PAP IG W/ RFLX HPV ASCU

## 2018-06-22 LAB — HUMAN PAPILLOMAVIRUS, HIGH RISK: HPV DNA High Risk: DETECTED — AB

## 2018-06-22 NOTE — Telephone Encounter (Signed)
-----   Message from Princess Bruins, MD sent at 06/20/2018  1:28 PM EDT ----- Regarding: Refer to [facility] Colonoscopy q 3 yrs recommended, due for screening colonoscopy.

## 2018-06-22 NOTE — Telephone Encounter (Signed)
Referral placed at [facility] they will call to schedule.  

## 2018-06-27 ENCOUNTER — Other Ambulatory Visit: Payer: Self-pay

## 2018-06-27 ENCOUNTER — Ambulatory Visit: Payer: Medicare HMO | Admitting: Family Medicine

## 2018-06-29 ENCOUNTER — Other Ambulatory Visit: Payer: Self-pay

## 2018-06-29 ENCOUNTER — Encounter: Payer: Self-pay | Admitting: Obstetrics & Gynecology

## 2018-06-29 ENCOUNTER — Ambulatory Visit: Payer: Medicare HMO | Admitting: Obstetrics & Gynecology

## 2018-06-29 VITALS — BP 130/86

## 2018-06-29 DIAGNOSIS — R87620 Atypical squamous cells of undetermined significance on cytologic smear of vagina (ASC-US): Secondary | ICD-10-CM

## 2018-06-29 DIAGNOSIS — N890 Mild vaginal dysplasia: Secondary | ICD-10-CM | POA: Diagnosis not present

## 2018-06-29 DIAGNOSIS — R87811 Vaginal high risk human papillomavirus (HPV) DNA test positive: Secondary | ICD-10-CM

## 2018-06-29 NOTE — Progress Notes (Signed)
    Debra Simon October 28, 1950 [id number]        68 y.o.  [id number] Separated  RP: ASCUS/HPV HR positive for Colposcopy  HPI: S/P Total Hysterectomy.  H/O VAIN 1, last Colpo 03/2017.  Pap test 06/20/2018 ASCUS/HPV HR positive.  Previous HPV 16-18-45 in 2017 negative.   OB History  Gravida Para Term Preterm AB Living  7 4     3 4   SAB TAB Ectopic Multiple Live Births  3            # Outcome Date GA Lbr Len/2nd Weight Sex Delivery Anes PTL Lv  7 SAB           6 SAB           5 SAB           4 Para           3 Para           2 Para           1 Para             Past medical history,surgical history, problem list, medications, allergies, family history and social history were all reviewed and documented in the EPIC chart.   Directed ROS with pertinent positives and negatives documented in the history of present illness/assessment and plan.  Exam:  Vitals:   06/29/18 1055  BP: 130/86   General appearance:  Normal  Colposcopy Procedure Note Tynesia Brownlow 06/29/2018  Indications:  ASCUS/HPV HR positive/H/O VAIN 1  Procedure Details  The risks and benefits of the procedure and Verbal informed consent obtained.  Speculum placed in vagina and excellent visualization of the vaginal vault achieved, which was swabbed x 3 with acetic acid solution.  Findings:  Cervix: Absent  Vaginal colposcopy:  AW with punctation at anterior vaginal vault at 11-12-2 O'Clock  Vulvar colposcopy: Normal  Perirectal colposcopy: Normal  The cervix was sprayed with Hurricane before performing the cervical biopsies.  Specimens: Vaginal Biopsies anterior vaginal vault at 11-12- 2 O'Clock  Complications:  None, Silver Nitrate applied . Plan:  Management per biopsy/HPV 16-18-45 results   Assessment/Plan:  68 y.o. [id number]   1. ASCUS with positive high risk human papillomavirus of vagina Counseling on ASCUS with positive HPV.  Colposcopy procedure reviewed.  Colposcopy findings discussed.   Management per results.  2. Vaginal intraepithelial neoplasia grade 1  Vaginal biopsies and HPV 16-18-45 pending.  Princess Bruins MD, 11:05 AM 06/29/2018

## 2018-07-01 ENCOUNTER — Encounter: Payer: Self-pay | Admitting: Obstetrics & Gynecology

## 2018-07-01 NOTE — Patient Instructions (Addendum)
1. ASCUS with positive high risk human papillomavirus of vagina Counseling on ASCUS with positive HPV.  Colposcopy procedure reviewed.  Colposcopy findings discussed.  Management per results.  2. Vaginal intraepithelial neoplasia grade 1  Vaginal biopsies and HPV 16-18-45 pending.   [place]a, fur un placer verle hoy!  Voy a informarle de sus [employer].

## 2018-07-02 ENCOUNTER — Ambulatory Visit: Payer: Medicare HMO | Admitting: Family Medicine

## 2018-07-03 LAB — TISSUE PATH REPORT 10802

## 2018-07-03 LAB — PATHOLOGY

## 2018-07-05 LAB — HPV TYPE 16 AND 18/45 RNA
HPV Type 16 RNA: NOT DETECTED
HPV Type 18/45 RNA: NOT DETECTED

## 2018-07-26 DIAGNOSIS — Z96651 Presence of right artificial knee joint: Secondary | ICD-10-CM | POA: Diagnosis not present

## 2018-08-23 NOTE — Telephone Encounter (Signed)
Debra Simon is not scheduling colonoscopy due to COVID-19 they will call patient once restrictions released  

## 2018-10-27 ENCOUNTER — Other Ambulatory Visit: Payer: Self-pay | Admitting: Family Medicine

## 2018-10-27 DIAGNOSIS — I10 Essential (primary) hypertension: Secondary | ICD-10-CM

## 2018-10-29 NOTE — Telephone Encounter (Signed)
Last fill 12/26/17  #90/3 Last OV 07/26/18

## 2019-01-02 ENCOUNTER — Encounter: Payer: Self-pay | Admitting: Gynecology

## 2019-01-08 ENCOUNTER — Other Ambulatory Visit: Payer: Self-pay

## 2019-01-09 ENCOUNTER — Ambulatory Visit: Payer: Medicare HMO | Admitting: Obstetrics & Gynecology

## 2019-01-09 ENCOUNTER — Encounter: Payer: Self-pay | Admitting: Obstetrics & Gynecology

## 2019-01-09 VITALS — BP 124/80

## 2019-01-09 DIAGNOSIS — N893 Dysplasia of vagina, unspecified: Secondary | ICD-10-CM | POA: Diagnosis not present

## 2019-01-09 DIAGNOSIS — Z1272 Encounter for screening for malignant neoplasm of vagina: Secondary | ICD-10-CM | POA: Diagnosis not present

## 2019-01-09 DIAGNOSIS — Z90710 Acquired absence of both cervix and uterus: Secondary | ICD-10-CM | POA: Diagnosis not present

## 2019-01-09 DIAGNOSIS — R8290 Unspecified abnormal findings in urine: Secondary | ICD-10-CM

## 2019-01-09 DIAGNOSIS — R300 Dysuria: Secondary | ICD-10-CM | POA: Diagnosis not present

## 2019-01-09 DIAGNOSIS — Z23 Encounter for immunization: Secondary | ICD-10-CM | POA: Diagnosis not present

## 2019-01-09 MED ORDER — SULFAMETHOXAZOLE-TRIMETHOPRIM 800-160 MG PO TABS: 1.0000 | ORAL_TABLET | Freq: Two times a day (BID) | ORAL | 0 refills | Status: AC

## 2019-01-09 NOTE — Progress Notes (Signed)
    Debra Simon [birth year] [id number]        68 y.o.  [id number]  Separated  RP:  VAIN 1 for 6 month repeat Pap test  HPI: S/P Total Hysterectomy.  H/O VAIN 1, last Colpo 06/2018.  Pap test 06/20/2018 ASCUS/HPV HR positive.  Previous HPV 16-18-45 in 06/2018 negative.  C/O bad odor in urine, no dysuria, no frequency of urination.  No fever.   OB History  Gravida Para Term Preterm AB Living  7 4     3 4   SAB TAB Ectopic Multiple Live Births  3            # Outcome Date GA Lbr Len/2nd Weight Sex Delivery Anes PTL Lv  7 SAB           6 SAB           5 SAB           4 Para           3 Para           2 Para           1 Para             Past medical history,surgical history, problem list, medications, allergies, family history and social history were all reviewed and documented in the EPIC chart.   Directed ROS with pertinent positives and negatives documented in the history of present illness/assessment and plan.  Exam:  Vitals:   01/09/19 1100  BP: 124/80   General appearance:  Normal  Abdomen: Normal  CVAT negative bilaterally  Gynecologic exam: Vulva normal.  Speculum:  Vaginal vault normal.  Pap/HPV HR done.  U/A: Dark yellow, cloudy, protein negative, nitrites negative, white blood cells 6-10, red blood cells 0-2, few bacteria.  Urine culture pending.   Assessment/Plan:  68 y.o. [id number]   1. VAIN (vaginal intraepithelial neoplasia) Colposcopy in April 2020 showed VAIN 1.  HPV 16-18-45 were negative.  Repeat Pap test done today with high-risk HPV.  If negative will resume annual Pap test. Pap/HPV HR done.  2. H/O total hysterectomy  3. Bad odor of urine Bad odor in urine with abnormal urine analysis.  Probable acute cystitis.  Decision to treat with Bactrim DS 1 tablet twice a day for 3 days.  Usage, risks and benefits reviewed.  Prescription sent to pharmacy. - Urinalysis,Complete w/RFL Culture  4. Need for immunization against influenza - Flu Vaccine QUAD  36+ mos IM  Other orders - sulfamethoxazole-trimethoprim (BACTRIM DS) 800-160 MG tablet; Take 1 tablet by mouth 2 (two) times daily for 3 days.  Counseling on above issues and coordination of care more than 50% for 15 minutes.  Princess Bruins MD, 11:08 AM 01/09/2019

## 2019-01-12 LAB — URINALYSIS, COMPLETE W/RFL CULTURE
Bilirubin Urine: NEGATIVE
Glucose, UA: NEGATIVE
Hyaline Cast: NONE SEEN /LPF
Ketones, ur: NEGATIVE
Nitrites, Initial: NEGATIVE
Protein, ur: NEGATIVE
Specific Gravity, Urine: 1.02 (ref 1.001–1.03)
pH: 7.5 (ref 5.0–8.0)

## 2019-01-12 LAB — URINE CULTURE
MICRO NUMBER:: 988827
SPECIMEN QUALITY:: ADEQUATE

## 2019-01-12 LAB — CULTURE INDICATED

## 2019-01-14 LAB — PAP, TP IMAGING W/ HPV RNA, RFLX HPV TYPE 16,18/45: HPV DNA High Risk: NOT DETECTED

## 2019-01-16 ENCOUNTER — Encounter: Payer: Self-pay | Admitting: Obstetrics & Gynecology

## 2019-01-16 NOTE — Patient Instructions (Addendum)
1. VAIN (vaginal intraepithelial neoplasia) Colposcopy in April 2020 showed VAIN 1.  HPV 16-18-45 were negative.  Repeat Pap test done today with high-risk HPV.  If negative will resume annual Pap test. Pap/HPV HR done.  2. H/O total hysterectomy  3. Bad odor of urine Bad odor in urine with abnormal urine analysis.  Probable acute cystitis.  Decision to treat with Bactrim DS 1 tablet twice a day for 3 days.  Usage, risks and benefits reviewed.  Prescription sent to pharmacy. - Urinalysis,Complete w/RFL Culture  4. Need for immunization against influenza - Flu Vaccine QUAD 36+ mos IM  Other orders - sulfamethoxazole-trimethoprim (BACTRIM DS) 800-160 MG tablet; Take 1 tablet by mouth 2 (two) times daily for 3 days.  Debra Simon, it was a pleasure seeing you today!  I will inform you of your results as soon as they are available.

## 2019-05-02 ENCOUNTER — Other Ambulatory Visit: Payer: Self-pay | Admitting: Obstetrics & Gynecology

## 2019-05-02 DIAGNOSIS — Z1231 Encounter for screening mammogram for malignant neoplasm of breast: Secondary | ICD-10-CM

## 2019-05-30 ENCOUNTER — Ambulatory Visit: Payer: Medicare HMO

## 2019-06-11 ENCOUNTER — Other Ambulatory Visit: Payer: Self-pay

## 2019-06-11 ENCOUNTER — Ambulatory Visit
Admission: RE | Admit: 2019-06-11 | Discharge: 2019-06-11 | Disposition: A | Discharge status: Home / Self Care | Payer: Medicare HMO | Source: Ambulatory Visit | Attending: Obstetrics & Gynecology | Admitting: Obstetrics & Gynecology

## 2019-06-11 DIAGNOSIS — Z1231 Encounter for screening mammogram for malignant neoplasm of breast: Secondary | ICD-10-CM

## 2019-06-12 ENCOUNTER — Other Ambulatory Visit: Payer: Self-pay | Admitting: Obstetrics & Gynecology

## 2019-06-12 DIAGNOSIS — R928 Other abnormal and inconclusive findings on diagnostic imaging of breast: Secondary | ICD-10-CM

## 2019-06-20 ENCOUNTER — Other Ambulatory Visit: Payer: Self-pay

## 2019-06-21 ENCOUNTER — Ambulatory Visit: Payer: Medicare HMO | Admitting: Obstetrics & Gynecology

## 2019-06-21 ENCOUNTER — Encounter: Payer: Self-pay | Admitting: Obstetrics & Gynecology

## 2019-06-21 VITALS — BP 130/88 | Ht 60.0 in | Wt 173.0 lb

## 2019-06-21 DIAGNOSIS — Z9289 Personal history of other medical treatment: Secondary | ICD-10-CM | POA: Diagnosis not present

## 2019-06-21 DIAGNOSIS — N893 Dysplasia of vagina, unspecified: Secondary | ICD-10-CM

## 2019-06-21 DIAGNOSIS — Z6833 Body mass index (BMI) 33.0-33.9, adult: Secondary | ICD-10-CM

## 2019-06-21 DIAGNOSIS — Z01419 Encounter for gynecological examination (general) (routine) without abnormal findings: Secondary | ICD-10-CM

## 2019-06-21 DIAGNOSIS — Z780 Asymptomatic menopausal state: Secondary | ICD-10-CM

## 2019-06-21 DIAGNOSIS — E6609 Other obesity due to excess calories: Secondary | ICD-10-CM

## 2019-06-21 DIAGNOSIS — Z1272 Encounter for screening for malignant neoplasm of vagina: Secondary | ICD-10-CM | POA: Diagnosis not present

## 2019-06-21 DIAGNOSIS — Z90710 Acquired absence of both cervix and uterus: Secondary | ICD-10-CM

## 2019-06-21 NOTE — Progress Notes (Signed)
Zenith Diss 24-Apr-1950 [id number]   History:    69 y.o. [id number] Separated  RP:  Established patient presenting for annual gyn exam   HPI:  S/P Total Hysterectomy.  Postmenopausal, well on no hormone replacement therapy.  No pelvic pain.  Abstinent.  Urine and bowel movements normal.  Breasts normal.  Screening mammo 05/2019, will have Rt Dx mammo/US in 06/2019. Body mass index 33.79.  Walking and working in the yard. Had a right knee replacement 2 yrs ago.  Health labs with family physician.  Time to repeat a Colono.  BD 03/2017 Normal.   Past medical history,surgical history, family history and social history were all reviewed and documented in the EPIC chart.  Gynecologic History No LMP recorded. Patient has had a hysterectomy.  Obstetric History OB History  Gravida Para Term Preterm AB Living  7 4     3 4   SAB TAB Ectopic Multiple Live Births  3            # Outcome Date GA Lbr Len/2nd Weight Sex Delivery Anes PTL Lv  7 SAB           6 SAB           5 SAB           4 Para           3 Para           2 Para           1 Para              ROS: A ROS was performed and pertinent positives and negatives are included in the history.  GENERAL: No fevers or chills. HEENT: No change in vision, no earache, sore throat or sinus congestion. NECK: No pain or stiffness. CARDIOVASCULAR: No chest pain or pressure. No palpitations. PULMONARY: No shortness of breath, cough or wheeze. GASTROINTESTINAL: No abdominal pain, nausea, vomiting or diarrhea, melena or bright red blood per rectum. GENITOURINARY: No urinary frequency, urgency, hesitancy or dysuria. MUSCULOSKELETAL: No joint or muscle pain, no back pain, no recent trauma. DERMATOLOGIC: No rash, no itching, no lesions. ENDOCRINE: No polyuria, polydipsia, no heat or cold intolerance. No recent change in weight. HEMATOLOGICAL: No anemia or easy bruising or bleeding. NEUROLOGIC: No headache, seizures, numbness, tingling or weakness.  PSYCHIATRIC: No depression, no loss of interest in normal activity or change in sleep pattern.     Exam:   BP 130/88   Ht 5' (1.524 m)   Wt 173 lb (78.5 kg)   BMI 33.79 kg/m   Body mass index is 33.79 kg/m.  General appearance : Well developed well nourished female. No acute distress HEENT: Eyes: no retinal hemorrhage or exudates,  Neck supple, trachea midline, no carotid bruits, no thyroidmegaly Lungs: Clear to auscultation, no rhonchi or wheezes, or rib retractions  Heart: Regular rate and rhythm, no murmurs or gallops Breast:Examined in sitting and supine position were symmetrical in appearance, no palpable masses or tenderness,  no skin retraction, no nipple inversion, no nipple discharge, no skin discoloration, no axillary or supraclavicular lymphadenopathy Abdomen: no palpable masses or tenderness, no rebound or guarding Extremities: no edema or skin discoloration or tenderness  Pelvic: Vulva: Normal             Vagina: No gross lesions or discharge.  Pap reflex done.  Cervix/Uterus absent  Adnexa  Without masses or tenderness  Anus: Normal   Assessment/Plan:  69 y.o. female for annual  exam   1. Encounter for Papanicolaou smear of vagina as part of routine gynecological examination Gynecologic exam status post total hysterectomy in menopause.  History of VAIN 1, therefore Pap reflex done on the vaginal vault.  Breast exam normal.  Scar at right breast from previous benign biopsy.  Screening mammogram negative on the left breast March 2021 and right diagnostic mammo with ultrasound scheduled for April 2021.  Will schedule a repeat screening colonoscopy through her family physician.  Health labs with family physician April 2021.  2. VAIN (vaginal intraepithelial neoplasia) Pap reflex done today.  3. S/P total hysterectomy  4. Postmenopausal Bone Density normal in 03/2017.  Vitamin D supplements, calcium intake of 1200 mg daily and regular weightbearing physical activities  recommended.  5. Class 1 obesity due to excess calories without serious comorbidity with body mass index (BMI) of 33.0 to 33.9 in adult Recommend a slightly lower calorie/carb diet such as [employer].  Aerobic activities five times a week and light weightlifting every 2 days.  Other orders - Multiple Vitamin (MULTIVITAMIN) tablet; Take 1 tablet by mouth daily.  Princess Bruins MD, 11:16 AM 06/21/2019

## 2019-06-21 NOTE — Patient Instructions (Signed)
1. Encounter for Papanicolaou smear of vagina as part of routine gynecological examination Gynecologic exam status post total hysterectomy in menopause.  History of VAIN 1, therefore Pap reflex done on the vaginal vault.  Breast exam normal.  Scar at right breast from previous benign biopsy.  Screening mammogram negative on the left breast March 2021 and right diagnostic mammo with ultrasound scheduled for April 2021.  Will schedule a repeat screening colonoscopy through her family physician.  Health labs with family physician April 2021.  2. VAIN (vaginal intraepithelial neoplasia) Pap reflex done today.  3. S/P total hysterectomy  4. Postmenopausal Bone Density normal in 03/2017.  Vitamin D supplements, calcium intake of 1200 mg daily and regular weightbearing physical activities recommended.  5. Class 1 obesity due to excess calories without serious comorbidity with body mass index (BMI) of 33.0 to 33.9 in adult Recommend a slightly lower calorie/carb diet such as [employer].  Aerobic activities five times a week and light weightlifting every 2 days.  Other orders - Multiple Vitamin (MULTIVITAMIN) tablet; Take 1 tablet by mouth daily.  Jasmonique, it was a pleasure seeing you today!  I will inform you of your results as soon as they are available.

## 2019-06-25 LAB — PAP IG W/ RFLX HPV ASCU

## 2019-06-28 ENCOUNTER — Ambulatory Visit
Admission: RE | Admit: 2019-06-28 | Discharge: 2019-06-28 | Disposition: A | Discharge status: Home / Self Care | Payer: Medicare HMO | Source: Ambulatory Visit | Attending: Obstetrics & Gynecology | Admitting: Obstetrics & Gynecology

## 2019-06-28 ENCOUNTER — Other Ambulatory Visit: Payer: Self-pay

## 2019-06-28 DIAGNOSIS — N6001 Solitary cyst of right breast: Secondary | ICD-10-CM | POA: Diagnosis not present

## 2019-06-28 DIAGNOSIS — R928 Other abnormal and inconclusive findings on diagnostic imaging of breast: Secondary | ICD-10-CM

## 2019-06-28 DIAGNOSIS — R922 Inconclusive mammogram: Secondary | ICD-10-CM | POA: Diagnosis not present

## 2019-08-30 DIAGNOSIS — H5203 Hypermetropia, bilateral: Secondary | ICD-10-CM | POA: Diagnosis not present

## 2019-08-30 DIAGNOSIS — H52223 Regular astigmatism, bilateral: Secondary | ICD-10-CM | POA: Diagnosis not present

## 2019-09-02 DIAGNOSIS — Z0100 Encounter for examination of eyes and vision without abnormal findings: Secondary | ICD-10-CM | POA: Diagnosis not present

## 2019-09-02 DIAGNOSIS — E7800 Pure hypercholesterolemia, unspecified: Secondary | ICD-10-CM | POA: Diagnosis not present

## 2019-09-02 DIAGNOSIS — H524 Presbyopia: Secondary | ICD-10-CM | POA: Diagnosis not present

## 2019-09-02 DIAGNOSIS — I10 Essential (primary) hypertension: Secondary | ICD-10-CM | POA: Diagnosis not present

## 2019-09-27 ENCOUNTER — Other Ambulatory Visit: Payer: Self-pay

## 2019-09-27 ENCOUNTER — Encounter: Payer: Self-pay | Admitting: Family Medicine

## 2019-09-27 ENCOUNTER — Ambulatory Visit (INDEPENDENT_AMBULATORY_CARE_PROVIDER_SITE_OTHER): Payer: Medicare HMO | Admitting: Family Medicine

## 2019-09-27 VITALS — BP 158/88 | HR 76 | Temp 98.6°F | Ht 60.0 in | Wt 172.0 lb

## 2019-09-27 DIAGNOSIS — E786 Lipoprotein deficiency: Secondary | ICD-10-CM

## 2019-09-27 DIAGNOSIS — Z8673 Personal history of transient ischemic attack (TIA), and cerebral infarction without residual deficits: Secondary | ICD-10-CM | POA: Diagnosis not present

## 2019-09-27 DIAGNOSIS — E782 Mixed hyperlipidemia: Secondary | ICD-10-CM

## 2019-09-27 DIAGNOSIS — I10 Essential (primary) hypertension: Secondary | ICD-10-CM

## 2019-09-27 DIAGNOSIS — Z1211 Encounter for screening for malignant neoplasm of colon: Secondary | ICD-10-CM | POA: Diagnosis not present

## 2019-09-27 DIAGNOSIS — Z23 Encounter for immunization: Secondary | ICD-10-CM | POA: Diagnosis not present

## 2019-09-27 DIAGNOSIS — R8290 Unspecified abnormal findings in urine: Secondary | ICD-10-CM | POA: Diagnosis not present

## 2019-09-27 DIAGNOSIS — Z86010 Personal history of colonic polyps: Secondary | ICD-10-CM

## 2019-09-27 DIAGNOSIS — E785 Hyperlipidemia, unspecified: Secondary | ICD-10-CM

## 2019-09-27 DIAGNOSIS — D126 Benign neoplasm of colon, unspecified: Secondary | ICD-10-CM

## 2019-09-27 LAB — CBC WITH DIFFERENTIAL/PLATELET
Basophils Absolute: 0 10*3/uL (ref 0.0–0.1)
Basophils Relative: 0.3 % (ref 0.0–3.0)
Eosinophils Absolute: 0.1 10*3/uL (ref 0.0–0.7)
Eosinophils Relative: 2.4 % (ref 0.0–5.0)
HCT: 41.2 % (ref 36.0–46.0)
Hemoglobin: 13.9 g/dL (ref 12.0–15.0)
Lymphocytes Relative: 29.5 % (ref 12.0–46.0)
Lymphs Abs: 1.7 10*3/uL (ref 0.7–4.0)
MCHC: 33.7 g/dL (ref 30.0–36.0)
MCV: 90 fl (ref 78.0–100.0)
Monocytes Absolute: 0.4 10*3/uL (ref 0.1–1.0)
Monocytes Relative: 7 % (ref 3.0–12.0)
Neutro Abs: 3.6 10*3/uL (ref 1.4–7.7)
Neutrophils Relative %: 60.8 % (ref 43.0–77.0)
Platelets: 209 10*3/uL (ref 150.0–400.0)
RBC: 4.58 Mil/uL (ref 3.87–5.11)
RDW: 13.4 % (ref 11.5–15.5)
WBC: 5.9 10*3/uL (ref 4.0–10.5)

## 2019-09-27 LAB — URINALYSIS, ROUTINE W REFLEX MICROSCOPIC
Bilirubin Urine: NEGATIVE
Hgb urine dipstick: NEGATIVE
Ketones, ur: NEGATIVE
Leukocytes,Ua: NEGATIVE
Nitrite: NEGATIVE
Specific Gravity, Urine: 1.015 (ref 1.000–1.030)
Total Protein, Urine: NEGATIVE
Urine Glucose: NEGATIVE
Urobilinogen, UA: 0.2 (ref 0.0–1.0)
pH: 8 (ref 5.0–8.0)

## 2019-09-27 LAB — COMPREHENSIVE METABOLIC PANEL
ALT: 17 U/L (ref 0–35)
AST: 22 U/L (ref 0–37)
Albumin: 4.7 g/dL (ref 3.5–5.2)
Alkaline Phosphatase: 72 U/L (ref 39–117)
BUN: 14 mg/dL (ref 6–23)
CO2: 31 mEq/L (ref 19–32)
Calcium: 9.7 mg/dL (ref 8.4–10.5)
Chloride: 100 mEq/L (ref 96–112)
Creatinine, Ser: 0.6 mg/dL (ref 0.40–1.20)
GFR: 99.45 mL/min (ref >60.00)
Glucose, Bld: 113 mg/dL — ABNORMAL HIGH (ref 70–99)
Potassium: 3.8 mEq/L (ref 3.5–5.1)
Sodium: 140 mEq/L (ref 135–145)
Total Bilirubin: 0.6 mg/dL (ref 0.2–1.2)
Total Protein: 7.4 g/dL (ref 6.0–8.3)

## 2019-09-27 LAB — TSH: TSH: 2.88 u[IU]/mL (ref 0.35–4.50)

## 2019-09-27 LAB — LIPID PANEL
Cholesterol: 136 mg/dL (ref 0–200)
HDL: 44.7 mg/dL (ref >39.00)
LDL Cholesterol: 73 mg/dL (ref 0–99)
NonHDL: 91.31
Total CHOL/HDL Ratio: 3
Triglycerides: 94 mg/dL (ref 0.0–149.0)
VLDL: 18.8 mg/dL (ref 0.0–40.0)

## 2019-09-27 MED ORDER — LOSARTAN POTASSIUM-HCTZ 100-25 MG PO TABS: 1.0000 | ORAL_TABLET | Freq: Every day | ORAL | 3 refills | Status: DC

## 2019-09-27 MED ORDER — ASPIRIN EC 81 MG PO TBEC: 81.0000 mg | DELAYED_RELEASE_TABLET | Freq: Every day | ORAL | Status: AC

## 2019-09-27 NOTE — Progress Notes (Signed)
Subjective  CC:  Chief Complaint  Patient presents with  . Transitions Of Care    from [place]   . Hypertension    takes medicaitons daily   . Hyperlipidemia    has been out of meds   . Referral    needs referral to GI     HPI: Debra Simon is a 69 y.o. female who presents to [facility] at [facility] today to establish care with me as a new patient.  Very pleasant 69 year old, married with grown children and grandchildren, originally from [place] and lived in [place] for many years, now in [place] for the last 15 years. She has not been seen in the office since 2019.  She has seen her gynecologist and I have reviewed those notes.  She also brings in records from her most recent GI colonoscopy showing a serrated adenoma in 2016. She has the following concerns or needs:  Essential hypertension: Blood pressures running a little bit high.  Was last seen in March by her gynecologist blood pressure was high at that time as well.  Taking low-dose Hyzaar without complications. Feeling well. Taking medications w/o adverse effects. No symptoms of CHF, angina; no palpitations, sob, cp or lower extremity edema. Compliant with meds.   Hyperlipidemia but has been on meds for several weeks.  No adverse effects.  Tolerating statin.  Overdue for lab work.  Fasting today.  She does have history of abnormal brain MRI showing small vessel disease and possible old lacunar infarct.  She has not taken aspirin daily.  Colonoscopy in 2016 showed diverticulosis and a serrated adenoma: She is now due for repeat colonic cancer surveillance.  She is going to be referred.  Treated for UTI back in March at that time she presented with malodorous urine.  She feels symptoms are mostly resolved although at times she will notice an odor.  No dysuria or gross hematuria.  No vaginal symptoms.  Health maintenance: She is up-to-date on mammogram, due colon cancer screening.  She is having vaginal cuff Pap  smears due to history of VIN.  GYN is following.  She is status post hysterectomy due to fibroids.  She is postmenopausal and not on HRT.  Assessment  1. Mixed hyperlipidemia   2. Essential hypertension   3. History of colon polyps   4. Colon cancer screening   5. Malodorous urine   6. History of lacunar cerebrovascular accident   7. Serrated adenoma of colon      Plan   Hyperlipidemia: Recheck fasting lipids and restart statins as indicated.  Hypertension, uncontrolled: We will increase dose of ACE inhibitor and hydrochlorothiazide.  Recheck 3 months.  Education given.  Low-salt diet.  Patient has blood pressure cuff at home will start monitoring and bring in data for me the next visit.  History of serrated adenoma refer to GI for colonoscopy  Recheck urine to ensure infection is resolved.  Recommend good hydration  History of lacunar infarct: Recommend baby aspirin daily   Follow up:  3 months to recheck bp Orders Placed This Encounter  Procedures  . CBC with Differential/Platelet  . Comprehensive metabolic panel  . Lipid panel  . TSH  . Urinalysis, Routine w reflex microscopic  . Ambulatory referral to Gastroenterology   Meds ordered this encounter  Medications  . losartan-hydrochlorothiazide (HYZAAR) 100-25 MG tablet    Sig: Take 1 tablet by mouth daily.    Dispense:  90 tablet    Refill:  3  . aspirin EC 81 MG tablet    Sig: Take 1 tablet (81 mg total) by mouth daily.     Depression screen [facility] 2/9 09/27/2019 12/26/2017 11/16/2016  Decreased Interest 2 0 0  Down, Depressed, Hopeless 0 0 0  PHQ - 2 Score 2 0 0  Altered sleeping 0 - -  Tired, decreased energy 0 - -  Feeling bad or failure about yourself  0 - -  Trouble concentrating 0 - -  Moving slowly or fidgety/restless 0 - -  Suicidal thoughts 0 - -  PHQ-9 Score 2 - -  Difficult doing work/chores Not difficult at all - -    We updated and reviewed the patient's past history in detail and it is documented  below.  Patient Active Problem List   Diagnosis Date Noted  . Serrated adenoma of colon 09/27/2019    Priority: High    Colonoscopy 2016, performed [place], recommend 5-year follow-up   . Mixed hyperlipidemia 06/02/2016    Priority: High  . Essential hypertension 07/31/2013    Priority: High  . History of lacunar cerebrovascular accident 09/27/2019    Priority: Medium    MRI 06/23/15 when working up dizziness   . Bilateral primary osteoarthritis of knee 06/05/2017    Priority: Medium  . History of vaginal dysplasia 08/31/2016    Priority: Medium  . Diverticulosis     Priority: Low  . Sensorineural hearing loss (SNHL), bilateral 07/07/2015    Priority: Low  . Rectocele 05/30/2014    Priority: Low  . Tinnitus of both ears 07/07/2015   Health Maintenance  Topic Date Due  . PNA vac Low Risk Adult (2 of 2 - PPSV23) 12/27/2018  . INFLUENZA VACCINE  10/27/2019  . COLONOSCOPY  06/02/2020  . MAMMOGRAM  06/10/2020  . DEXA SCAN  04/18/2022  . TETANUS/TDAP  01/23/2028  . COVID-19 Vaccine  Completed  . Hepatitis C Screening  Completed   Immunization History  Administered Date(s) Administered  . Influenza, High Dose Seasonal PF 12/26/2017  . Influenza,inj,Quad PF,6+ Mos 01/09/2019  . Moderna SARS-COVID-2 Vaccination 07/26/2019, 08/23/2019  . Pneumococcal Conjugate-13 12/26/2017  . Tdap 01/22/2018  . Zoster Recombinat (Shingrix) 07/22/2016, 11/18/2016   Current Meds  Medication Sig  . Multiple Vitamin (MULTIVITAMIN) tablet Take 1 tablet by mouth daily.  . [DISCONTINUED] losartan-hydrochlorothiazide (HYZAAR) 50-12.5 MG tablet TAKE 1 TABLET BY MOUTH EVERY DAY    Allergies: Patient has No Known Allergies. Past Medical History Patient  has a past medical history of Arthritis, Colon polyps (9702), Diastolic dysfunction, left ventricle (06/26/2016), Diverticulosis, GERD (gastroesophageal reflux disease), Heart murmur, Hypertension, Mild mitral regurgitation (07/04/2016), Primary  localized osteoarthritis of right knee (05/24/2017), Ureterovaginal fistula, Uterine fibroid (1994), and Vaginal dysplasia. Past Surgical History Patient  has a past surgical history that includes Cystoscopy; Ureteral exploration (04/16/1992); Colonoscopy (2010); Esophagogastroduodenoscopy (2010); Colonoscopy (2012); Breast cyst aspiration (Right); Breast excisional biopsy (Right); Total abdominal hysterectomy (1994); Total knee arthroplasty (Right, 06/05/2017); and Total knee arthroplasty (Right, 06/05/2017). Family History: Patient family history includes Brain cancer in her father; Breast cancer in her paternal aunt; Diabetes in her brother, mother, and sister; Hypertension in her mother and sister; Stomach cancer in her paternal uncle; Uterine cancer in her paternal aunt. Social History:  Patient  reports that she has never smoked. She has never used smokeless tobacco. She reports that she does not drink alcohol and does not use drugs.  Review of Systems: Constitutional: negative for fever or malaise Ophthalmic: negative for photophobia,  double vision or loss of vision Cardiovascular: negative for chest pain, dyspnea on exertion, or new LE swelling Respiratory: negative for SOB or persistent cough Gastrointestinal: negative for abdominal pain, change in bowel habits or melena Genitourinary: negative for dysuria or gross hematuria Musculoskeletal: negative for new gait disturbance or muscular weakness Integumentary: negative for new or persistent rashes Neurological: negative for TIA or stroke symptoms Psychiatric: negative for SI or delusions Allergic/Immunologic: negative for hives  Patient Care Team    Relationship Specialty Notifications Start End  Leamon Arnt, MD PCP - General Family Medicine  09/27/19   Elsie Saas, MD Consulting Physician Orthopedic Surgery  08/09/18   Princess Bruins, MD Consulting Physician Obstetrics and Gynecology  09/27/19     Objective  Vitals: BP (!)  158/88   Pulse 76   Temp 98.6 F (37 C) (Temporal)   Ht 5' (1.524 m)   Wt 172 lb (78 kg)   SpO2 95%   BMI 33.59 kg/m  General:  Well developed, well nourished, no acute distress  Psych:  Alert and oriented,normal mood and affect HEENT:  Normocephalic, atraumatic, non-icteric sclera, supple neck without adenopathy, mass or thyromegaly Cardiovascular:  RRR without gallop, rub or murmur Respiratory:  Good breath sounds bilaterally, CTAB with normal respiratory effort Neurologic:    Mental status is normal.  Commons side effects, risks, benefits, and alternatives for medications and treatment plan prescribed today were discussed, and the patient expressed understanding of the given instructions. Patient is instructed to call or message via MyChart if he/she has any questions or concerns regarding our treatment plan. No barriers to understanding were identified. We discussed Red Flag symptoms and signs in detail. Patient expressed understanding regarding what to do in case of urgent or emergency type symptoms.   Medication list was reconciled, printed and provided to the patient in AVS. Patient instructions and summary information was reviewed with the patient as documented in the AVS. This note was prepared with assistance of Dragon voice recognition software. Occasional wrong-word or sound-a-like substitutions may have occurred due to the inherent limitations of voice recognition software  This visit occurred during the SARS-CoV-2 public health emergency.  Safety protocols were in place, including screening questions prior to the visit, additional usage of staff PPE, and extensive cleaning of exam room while observing appropriate contact time as indicated for disinfecting solutions.

## 2019-09-27 NOTE — Patient Instructions (Signed)
Please return in 3 months to recheck your blood pressure.   We will call you with your results.  We will call you with an appointment to get set up for colonoscopy.   I have increased your blood pressure medication dose.  I will order new cholesterol medication after I get your blood work back.   It was a pleasure meeting you today! Thank you for choosing [place] to meet your healthcare needs! I truly look forward to working with you. If you have any questions or concerns, please send me a message via Mychart or call the office at [phone].

## 2019-10-01 NOTE — Progress Notes (Signed)
Please call patient: I have reviewed his/her lab results. All labs look fine. Urine looks ok as well.  Return if she develops any urinary symptoms. And Follow up as directed.

## 2019-10-22 ENCOUNTER — Other Ambulatory Visit: Payer: Self-pay | Admitting: Family Medicine

## 2019-10-22 DIAGNOSIS — I10 Essential (primary) hypertension: Secondary | ICD-10-CM

## 2019-11-21 ENCOUNTER — Other Ambulatory Visit: Payer: Self-pay

## 2019-11-21 ENCOUNTER — Ambulatory Visit (INDEPENDENT_AMBULATORY_CARE_PROVIDER_SITE_OTHER): Payer: Medicare HMO

## 2019-11-21 VITALS — BP 142/78 | HR 79 | Resp 20 | Ht 60.0 in | Wt 173.6 lb

## 2019-11-21 DIAGNOSIS — Z0000 Encounter for general adult medical examination without abnormal findings: Secondary | ICD-10-CM | POA: Diagnosis not present

## 2019-11-21 NOTE — Patient Instructions (Addendum)
Debra Simon , Thank you for taking time to come for your Medicare Wellness Visit. I appreciate your ongoing commitment to your health goals. Please review the following plan we discussed and let me know if I can assist you in the future.   Screening recommendations/referrals: Colonoscopy: pt stated not Done 10/16/19 Mammogram: Done 06/28/19 Bone Density: Done 04/18/17 Recommended yearly ophthalmology/optometry visit for glaucoma screening and checkup Recommended yearly dental visit for hygiene and checkup  Vaccinations: Influenza vaccine: Up to date Pneumococcal vaccine: Up to date Tdap vaccine: Up to date Shingles vaccine: Completed 07/23/19  & 11/18/16   Covid-19:Completed 4/30 & 08/23/19  Advanced directives: Advance directive discussed with you today. I have provided a copy for you to complete at home and have notarized. Once this is complete please bring a copy in to our office so we can scan it into your chart.   Conditions/risks identified: Lose weight  Next appointment: Follow up in one year for your annual wellness visit    Preventive Care 65 Years and Older, Female Preventive care refers to lifestyle choices and visits with your health care provider that can promote health and wellness. What does preventive care include?  A yearly physical exam. This is also called an annual well check.  Dental exams once or twice a year.  Routine eye exams. Ask your health care provider how often you should have your eyes checked.  Personal lifestyle choices, including:  Daily care of your teeth and gums.  Regular physical activity.  Eating a healthy diet.  Avoiding tobacco and drug use.  Limiting alcohol use.  Practicing safe sex.  Taking low-dose aspirin every day.  Taking vitamin and mineral supplements as recommended by your health care provider. What happens during an annual well check? The services and screenings done by your health care provider during your annual well  check will depend on your age, overall health, lifestyle risk factors, and family history of disease. Counseling  Your health care provider may ask you questions about your:  Alcohol use.  Tobacco use.  Drug use.  Emotional well-being.  Home and relationship well-being.  Sexual activity.  Eating habits.  History of falls.  Memory and ability to understand (cognition).  Work and work Statistician.  Reproductive health. Screening  You may have the following tests or measurements:  Height, weight, and BMI.  Blood pressure.  Lipid and cholesterol levels. These may be checked every 5 years, or more frequently if you are over 38 years old.  Skin check.  Lung cancer screening. You may have this screening every year starting at age 52 if you have a 30-pack-year history of smoking and currently smoke or have quit within the past 15 years.  Fecal occult blood test (FOBT) of the stool. You may have this test every year starting at age 6.  Flexible sigmoidoscopy or colonoscopy. You may have a sigmoidoscopy every 5 years or a colonoscopy every 10 years starting at age 45.  Hepatitis C blood test.  Hepatitis B blood test.  Sexually transmitted disease (STD) testing.  Diabetes screening. This is done by checking your blood sugar (glucose) after you have not eaten for a while (fasting). You may have this done every 1-3 years.  Bone density scan. This is done to screen for osteoporosis. You may have this done starting at age 20.  Mammogram. This may be done every 1-2 years. Talk to your health care provider about how often you should have regular mammograms. Talk with your health  care provider about your test results, treatment options, and if necessary, the need for more tests. Vaccines  Your health care provider may recommend certain vaccines, such as:  Influenza vaccine. This is recommended every year.  Tetanus, diphtheria, and acellular pertussis (Tdap, Td) vaccine. You  may need a Td booster every 10 years.  Zoster vaccine. You may need this after age 72.  Pneumococcal 13-valent conjugate (PCV13) vaccine. One dose is recommended after age 86.  Pneumococcal polysaccharide (PPSV23) vaccine. One dose is recommended after age 55. Talk to your health care provider about which screenings and vaccines you need and how often you need them. This information is not intended to replace advice given to you by your health care provider. Make sure you discuss any questions you have with your health care provider. Document Released: 04/10/2015 Document Revised: 12/02/2015 Document Reviewed: 01/13/2015 Elsevier Interactive Patient Education  2017 [place] Prevention in the Home Falls can cause injuries. They can happen to people of all ages. There are many things you can do to make your home safe and to help prevent falls. What can I do on the outside of my home?  Regularly fix the edges of walkways and driveways and fix any cracks.  Remove anything that might make you trip as you walk through a door, such as a raised step or threshold.  Trim any bushes or trees on the path to your home.  Use bright outdoor lighting.  Clear any walking paths of anything that might make someone trip, such as rocks or tools.  Regularly check to see if handrails are loose or broken. Make sure that both sides of any steps have handrails.  Any raised decks and porches should have guardrails on the edges.  Have any leaves, snow, or ice cleared regularly.  Use sand or salt on walking paths during winter.  Clean up any spills in your garage right away. This includes oil or grease spills. What can I do in the bathroom?  Use night lights.  Install grab bars by the toilet and in the tub and shower. Do not use towel bars as grab bars.  Use non-skid mats or decals in the tub or shower.  If you need to sit down in the shower, use a plastic, non-slip stool.  Keep the floor  dry. Clean up any water that spills on the floor as soon as it happens.  Remove soap buildup in the tub or shower regularly.  Attach bath mats securely with double-sided non-slip rug tape.  Do not have throw rugs and other things on the floor that can make you trip. What can I do in the bedroom?  Use night lights.  Make sure that you have a light by your bed that is easy to reach.  Do not use any sheets or blankets that are too big for your bed. They should not hang down onto the floor.  Have a firm chair that has side arms. You can use this for support while you get dressed.  Do not have throw rugs and other things on the floor that can make you trip. What can I do in the kitchen?  Clean up any spills right away.  Avoid walking on wet floors.  Keep items that you use a lot in easy-to-reach places.  If you need to reach something above you, use a strong step stool that has a grab bar.  Keep electrical cords out of the way.  Do not use floor  polish or wax that makes floors slippery. If you must use wax, use non-skid floor wax.  Do not have throw rugs and other things on the floor that can make you trip. What can I do with my stairs?  Do not leave any items on the stairs.  Make sure that there are handrails on both sides of the stairs and use them. Fix handrails that are broken or loose. Make sure that handrails are as [place] as the stairways.  Check any carpeting to make sure that it is firmly attached to the stairs. Fix any carpet that is loose or worn.  Avoid having throw rugs at the top or bottom of the stairs. If you do have throw rugs, attach them to the floor with carpet tape.  Make sure that you have a light switch at the top of the stairs and the bottom of the stairs. If you do not have them, ask someone to add them for you. What else can I do to help prevent falls?  Wear shoes that:  Do not have high heels.  Have rubber bottoms.  Are comfortable and fit you  well.  Are closed at the toe. Do not wear sandals.  If you use a stepladder:  Make sure that it is fully opened. Do not climb a closed stepladder.  Make sure that both sides of the stepladder are locked into place.  Ask someone to hold it for you, if possible.  Clearly mark and make sure that you can see:  Any grab bars or handrails.  First and last steps.  Where the edge of each step is.  Use tools that help you move around (mobility aids) if they are needed. These include:  Canes.  Walkers.  Scooters.  Crutches.  Turn on the lights when you go into a dark area. Replace any light bulbs as soon as they burn out.  Set up your furniture so you have a clear path. Avoid moving your furniture around.  If any of your floors are uneven, fix them.  If there are any pets around you, be aware of where they are.  Review your medicines with your doctor. Some medicines can make you feel dizzy. This can increase your chance of falling. Ask your doctor what other things that you can do to help prevent falls. This information is not intended to replace advice given to you by your health care provider. Make sure you discuss any questions you have with your health care provider. Document Released: 01/08/2009 Document Revised: 08/20/2015 Document Reviewed: 04/18/2014 Elsevier Interactive Patient Education  2017 [employer].

## 2019-11-21 NOTE — Progress Notes (Signed)
Subjective:   Debra Simon is a 69 y.o. female who presents for an Initial Medicare Annual Wellness Visit.  Review of Systems     Cardiac Risk Factors include: advanced age (>8men, >24 women);dyslipidemia;hypertension;obesity (BMI >30kg/m2)     Objective:    Today's Vitals   11/21/19 1014 11/21/19 1052  BP: (!) 150/64 (!) 142/78  Pulse: 79   Resp: 20   SpO2: 99%   Weight: 173 lb 9.6 oz (78.7 kg)   Height: 5' (1.524 m)    Body mass index is 33.9 kg/m.  Advanced Directives 11/21/2019 06/06/2017 05/25/2017 06/23/2015  Does Patient Have a Medical Advance Directive? No No No No  Would patient like information on creating a medical advance directive? Yes (MAU/Ambulatory/Procedural Areas - Information given) No - Patient declined No - Patient declined No - patient declined information    Current Medications (verified) Outpatient Encounter Medications as of 11/21/2019  Medication Sig  . aspirin EC 81 MG tablet Take 1 tablet (81 mg total) by mouth daily.  Marland Kitchen losartan-hydrochlorothiazide (HYZAAR) 100-25 MG tablet Take 1 tablet by mouth daily.  . Multiple Vitamin (MULTIVITAMIN) tablet Take 1 tablet by mouth daily.  . simvastatin (ZOCOR) 20 MG tablet Take 1 tablet (20 mg total) by mouth at bedtime.   No facility-administered encounter medications on file as of 11/21/2019.    Allergies (verified) Patient has no known allergies.   History: Past Medical History:  Diagnosis Date  . Arthritis   . Colon polyps 2010  . Diastolic dysfunction, left ventricle 06/26/2016   (ECHO 2018): Left ventricle: The cavity size was normal. Systolic function was normal. The estimated ejection fraction was in the range of 60% to 65%. Wall motion was normal; there were no regional wall motion abnormalities. Doppler parameters are consistent with abnormal left ventricular relaxation (grade 1 diastolic dysfunction).  . Diverticulosis   . GERD (gastroesophageal reflux disease)    watches what she eats  .  Heart murmur    dx last yr.   had echo 05/2016  . Hypertension   . Mild mitral regurgitation 07/04/2016  . Primary localized osteoarthritis of right knee 05/24/2017  . Ureterovaginal fistula   . Uterine fibroid 1994  . Vaginal dysplasia    Past Surgical History:  Procedure Laterality Date  . BREAST CYST ASPIRATION Right   . BREAST EXCISIONAL BIOPSY Right   . COLONOSCOPY  2010  . COLONOSCOPY  2012  . CYSTOSCOPY    . ESOPHAGOGASTRODUODENOSCOPY  2010  . TOTAL ABDOMINAL HYSTERECTOMY  1994  . TOTAL KNEE ARTHROPLASTY Right 06/05/2017  . TOTAL KNEE ARTHROPLASTY Right 06/05/2017   Procedure: RIGHT TOTAL KNEE ARTHROPLASTY;  Surgeon: Elsie Saas, MD;  Location: [facility];  Service: Orthopedics;  Laterality: Right;  . URETERAL EXPLORATION  04/16/1992   URETEROTOMY ON THE RIGHT, REPAIR LACERATION ON THE LEFT   Family History  Problem Relation Age of Onset  . Brain cancer Father   . Breast cancer Paternal Aunt   . Diabetes Mother   . Hypertension Mother   . Hypertension Sister   . Diabetes Brother   . Stomach cancer Paternal Uncle   . Diabetes Sister   . Uterine cancer Paternal Aunt   . CAD Neg Hx   . Stroke Neg Hx    Social History   Socioeconomic History  . Marital status: Married    Spouse name: Not on file  . Number of children: Not on file  . Years of education: Not on file  . Highest education  level: GED or equivalent  Occupational History  . Occupation: Housewife  Tobacco Use  . Smoking status: Never Smoker  . Smokeless tobacco: Never Used  Vaping Use  . Vaping Use: Never used  Substance and Sexual Activity  . Alcohol use: No    Alcohol/week: 0.0 standard drinks    Comment: rare on holidays  . Drug use: No  . Sexual activity: Never  Other Topics Concern  . Not on file  Social History Narrative  . Not on file   Social Determinants of Health   Financial Resource Strain: Low Risk   . Difficulty of Paying Living Expenses: Not hard at all  Food Insecurity: No Food  Insecurity  . Worried About Charity fundraiser in the Last Year: Never true  . Ran Out of Food in the Last Year: Never true  Transportation Needs: No Transportation Needs  . Lack of Transportation (Medical): No  . Lack of Transportation (Non-Medical): No  Physical Activity: Inactive  . Days of Exercise per Week: 0 days  . Minutes of Exercise per Session: 0 min  Stress: No Stress Concern Present  . Feeling of Stress : Not at all  Social Connections: Moderately Integrated  . Frequency of Communication with Friends and Family: More than three times a week  . Frequency of Social Gatherings with Friends and Family: Twice a week  . Attends Religious Services: More than 4 times per year  . Active Member of Clubs or Organizations: No  . Attends Archivist Meetings: Never  . Marital Status: Married    Tobacco Counseling Counseling given: Not Answered   Clinical Intake:  Pre-visit preparation completed: Yes  Pain : No/denies pain     BMI - recorded: 33.9 Nutritional Status: BMI > 30  Obese Nutritional Risks: None Diabetes: No  How often do you need to have someone help you when you read instructions, pamphlets, or other written materials from your doctor or pharmacy?: 1 - Never  Diabetic?No  Interpreter Needed?: Yes Interpreter Agency: [place] [employer] Interpreter Name: Miquel Dunn Interpreter ID: [id number] Patient Declined Interpreter : No  Comments: came with patient Information entered by :: Charlott Rakes, LPN   Activities of Daily Living In your present state of health, do you have any difficulty performing the following activities: 11/21/2019 09/27/2019  Hearing? Y N  Comment stated difficulty at time hearing -  Vision? Y N  Comment difficulty in right eye better with eye glasses -  Difficulty concentrating or making decisions? N N  Walking or climbing stairs? N N  Dressing or bathing? N N  Doing errands, shopping? N Y  Comment - needs interpreter  : Architectural technologist and eating ? N -  Using the Toilet? N -  In the past six months, have you accidently leaked urine? Y -  Comment wears pad or brief at times when i sneeze -  Do you have problems with loss of bowel control? N -  Managing your Medications? N -  Managing your Finances? N -  Housekeeping or managing your Housekeeping? N -  Some recent data might be hidden    Patient Care Team: Leamon Arnt, MD as PCP - General (Family Medicine) Elsie Saas, MD as Consulting Physician (Orthopedic Surgery) Princess Bruins, MD as Consulting Physician (Obstetrics and Gynecology)  Indicate any recent Medical Services you may have received from other than [facility] providers in the past year (date may be approximate).     Assessment:  This is a routine wellness examination for Debra Simon.  Hearing/Vision screen  Hearing Screening   125Hz  250Hz  500Hz  1000Hz  2000Hz  3000Hz  4000Hz  6000Hz  8000Hz   Right ear:           Left ear:           Comments: Pt states ringing in ears and Audiologist stated may need hearing aids eventully  Vision Screening Comments: Pt follows up with eye exams annually [place] eye [place] [address] on June 4th, 2021 scar in right eye found and can be difficult to see at times  Dietary issues and exercise activities discussed: Current Exercise Habits: Home exercise routine (statinary bike and gardening), Type of exercise: walking;Other - see comments (gardening and stationary bike), Time (Minutes): 60, Frequency (Times/Week): 5, Weekly Exercise (Minutes/Week): 300  Goals    . Patient Stated     Lose weight      Depression Screen PHQ 2/9 Scores 11/21/2019 09/27/2019 12/26/2017 11/16/2016  PHQ - 2 Score 0 2 0 0  PHQ- 9 Score - 2 - -    Fall Risk Fall Risk  11/21/2019 09/27/2019 12/26/2017 11/16/2016  Falls in the past year? 0 0 No No  Number falls in past yr: 0 0 - -  Injury with Fall? 0 0 - -  Risk for fall due to : Impaired vision;Orthopedic patient;Impaired  balance/gait - - -  Risk for fall due to: Comment if move quickly can get dizzy - - -    Any stairs in or around the home? Yes  If so, are there any without handrails? No  Home free of loose throw rugs in walkways, pet beds, electrical cords, etc? Yes  Adequate lighting in your home to reduce risk of falls? Yes   ASSISTIVE DEVICES UTILIZED TO PREVENT FALLS:  Life alert? No  Use of a cane, walker or w/c? No  Grab bars in the bathroom? Yes  Shower chair or bench in shower? No  Elevated toilet seat or a handicapped toilet? No   TIMED UP AND GO:  Was the test performed? Yes .  Length of time to ambulate 10 feet: 10  sec.   Gait steady and fast without use of assistive device  Cognitive Function:     6CIT Screen 11/21/2019  What Year? 0 points  What month? 0 points  Count back from 20 0 points  Months in reverse 2 points  Repeat phrase 0 points    Immunizations Immunization History  Administered Date(s) Administered  . Influenza, High Dose Seasonal PF 12/26/2017  . Influenza,inj,Quad PF,6+ Mos 01/09/2019  . Moderna SARS-COVID-2 Vaccination 07/26/2019, 08/23/2019  . Pneumococcal Conjugate-13 12/26/2017  . Pneumococcal Polysaccharide-23 09/27/2019  . Tdap 01/22/2018  . Zoster Recombinat (Shingrix) 07/22/2016, 11/18/2016    TDAP status: Up to date Flu Vaccine status: Up to date Pneumococcal vaccine status: Up to date Covid-19 vaccine status: Completed vaccines  Qualifies for Shingles Vaccine? Yes   Zostavax completed Yes   Shingrix Completed?: Yes  Screening Tests Health Maintenance  Topic Date Due  . INFLUENZA VACCINE  10/27/2019  . MAMMOGRAM  06/10/2020  . DEXA SCAN  04/18/2022  . COLONOSCOPY  10/15/2024  . TETANUS/TDAP  01/23/2028  . COVID-19 Vaccine  Completed  . Hepatitis C Screening  Completed  . PNA vac Low Risk Adult  Completed    Health Maintenance  Health Maintenance Due  Topic Date Due  . INFLUENZA VACCINE  10/27/2019    Colorectal cancer  screening: Completed 10/16/19. Repeat every 5 years  pt stated this was not done last reported colonoscopy 11/19/14, please advise pt as she is eager to get test completed  Mammogram status: Completed 06/28/19. Repeat every year Bone Density status: Completed 04/18/17. Results reflect: Bone density results: NORMAL. Repeat every 5 years.    Additional Screening:  Hepatitis C Screening: Completed 01/20/17  Vision Screening: Recommended annual ophthalmology exams for early detection of glaucoma and other disorders of the eye. Is the patient up to date with their annual eye exam?  Yes  Who is the provider or what is the name of the office in which the patient attends annual eye exams? [facility] on [address]  Dental Screening: Recommended annual dental exams for proper oral hygiene  Community Resource Referral / Chronic Care Management: CRR required this visit?  No   CCM required this visit?  No      Plan:     I have personally reviewed and noted the following in the patient's chart:   . Medical and social history . Use of alcohol, tobacco or illicit drugs  . Current medications and supplements . Functional ability and status . Nutritional status . Physical activity . Advanced directives . List of other physicians . Hospitalizations, surgeries, and ER visits in previous 12 months . Vitals . Screenings to include cognitive, depression, and falls . Referrals and appointments  In addition, I have reviewed and discussed with patient certain preventive protocols, quality metrics, and best practice recommendations. A written personalized care plan for preventive services as well as general preventive health recommendations were provided to patient.     Willette Brace, LPN   [id number]   Nurse Notes: Pt stated that colonoscopy was not done that was ordered on 10/16/19 she wants it completed asap if possible unsure as to why it was not completed.

## 2019-12-13 ENCOUNTER — Encounter: Payer: Self-pay | Admitting: Family Medicine

## 2019-12-16 ENCOUNTER — Telehealth: Payer: Self-pay | Admitting: Gastroenterology

## 2019-12-16 NOTE — Telephone Encounter (Signed)
Hi Dr. Loletha Carrow,  We received a referral for patient to have a repeat colonoscopy. Patient had one done in 2016 in [place]. Requested op\path reports for you to review.   Please advise on scheduling.  Thank you

## 2019-12-17 NOTE — Telephone Encounter (Signed)
(  For documentation purposes - colonoscopy 11/19/14 by Dr. Kathreen Cosier in [place], [place] "polypoid lesion in anorectum, could be old hemorrhoid remnant" Diverticulosis left sided Int HR Path: serrated adenoma Subsequent office note by Dr. Katharine Look of Surgery indicating plans for HR banding)  This patient is due for a surveillance colonoscopy - Hx of colon polyps. Please arrange in [place] on my schedule.  - HD

## 2019-12-18 ENCOUNTER — Encounter: Payer: Self-pay | Admitting: Gastroenterology

## 2020-01-08 ENCOUNTER — Ambulatory Visit (INDEPENDENT_AMBULATORY_CARE_PROVIDER_SITE_OTHER): Payer: Medicare HMO | Admitting: Family Medicine

## 2020-01-08 ENCOUNTER — Encounter: Payer: Self-pay | Admitting: Family Medicine

## 2020-01-08 ENCOUNTER — Other Ambulatory Visit: Payer: Self-pay

## 2020-01-08 VITALS — BP 138/80 | HR 61 | Temp 98.2°F | Ht 60.0 in | Wt 170.6 lb

## 2020-01-08 DIAGNOSIS — I10 Essential (primary) hypertension: Secondary | ICD-10-CM | POA: Diagnosis not present

## 2020-01-08 DIAGNOSIS — Z8673 Personal history of transient ischemic attack (TIA), and cerebral infarction without residual deficits: Secondary | ICD-10-CM

## 2020-01-08 DIAGNOSIS — D126 Benign neoplasm of colon, unspecified: Secondary | ICD-10-CM

## 2020-01-08 DIAGNOSIS — Z23 Encounter for immunization: Secondary | ICD-10-CM | POA: Diagnosis not present

## 2020-01-08 DIAGNOSIS — E782 Mixed hyperlipidemia: Secondary | ICD-10-CM | POA: Diagnosis not present

## 2020-01-08 DIAGNOSIS — D171 Benign lipomatous neoplasm of skin and subcutaneous tissue of trunk: Secondary | ICD-10-CM

## 2020-01-08 NOTE — Progress Notes (Signed)
Subjective  CC:  Chief Complaint  Patient presents with  . Hypertension    is checking BP at home, states she does not get high readings - usually less than 140/80's   . Health Maintenance    flu shot given today in office, colonscopy scheduled Nov 19th with [facility]    HPI: Debra Simon is a 69 y.o. female who presents to the office today to address the problems listed above in the chief complaint.  Hypertension f/u: 21-month follow-up for blood pressure after increasing dose of Hyzaar.  Control is good . Pt reports she is doing well. taking medications as instructed, no medication side effects noted, no TIAs, no chest pain on exertion, no dyspnea on exertion, no swelling of ankles. Home readings are much improved. She denies adverse effects from his BP medications although she does describe episodes of morning disequilibrium lasting minutes only.  No true lightheadedness.  No true vertigo.  No paresis.  Compliance with medication is good.  Review of systems is positive for palpitations.  Mostly associated with worry or anxiety.  She has seen a cardiologist in the past.  Hyperlipidemia is well controlled on statin.  History of lacunar CVA now on aspirin therapy.  Complains of small nodule and anterior abdominal wall.  Has been there for many years.  Barely growing.  Not painful.  History of serrated adenoma scheduled for colonoscopy next month.  Assessment  1. Essential hypertension   2. History of lacunar cerebrovascular accident   3. Mixed hyperlipidemia   4. Lipoma of abdominal wall   5. Serrated adenoma of colon      Plan    Hypertension f/u: BP control is fairly well controlled.  Continue current medication.  Monitor for side effects.  Continue home checks.  Has whitecoat component.  Hyperlipidemia f/u: At goal  Lipoma: Reassured  History of CVA: Control blood pressure lipids well and continue baby aspirin for prevention, secondary  Follow-up on  colonoscopy.  Flu shot today   Education regarding management of these chronic disease states was given. Management strategies discussed on successive visits include dietary and exercise recommendations, goals of achieving and maintaining IBW, and lifestyle modifications aiming for adequate sleep and minimizing stressors.   Follow up: Return in about 6 months (around 07/08/2020) for complete physical, follow up Hypertension.  No orders of the defined types were placed in this encounter.  No orders of the defined types were placed in this encounter.     BP Readings from Last 3 Encounters:  01/08/20 (!) 152/82  11/21/19 (!) 142/78  09/27/19 (!) 158/88   Wt Readings from Last 3 Encounters:  01/08/20 170 lb 9.6 oz (77.4 kg)  11/21/19 173 lb 9.6 oz (78.7 kg)  09/27/19 172 lb (78 kg)    Lab Results  Component Value Date   CHOL 136 09/27/2019   CHOL 142 12/26/2017   CHOL 152 01/20/2017   Lab Results  Component Value Date   HDL 44.70 09/27/2019   HDL 41.60 12/26/2017   HDL 45.20 01/20/2017   Lab Results  Component Value Date   LDLCALC 73 09/27/2019   LDLCALC 84 12/26/2017   LDLCALC 88 01/20/2017   Lab Results  Component Value Date   TRIG 94.0 09/27/2019   TRIG 85.0 12/26/2017   TRIG 94.0 01/20/2017   Lab Results  Component Value Date   CHOLHDL 3 09/27/2019   CHOLHDL 3 12/26/2017   CHOLHDL 3 01/20/2017   No results found for: LDLDIRECT Lab Results  Component Value Date   CREATININE 0.60 09/27/2019   BUN 14 09/27/2019   NA 140 09/27/2019   K 3.8 09/27/2019   CL 100 09/27/2019   CO2 31 09/27/2019    The 10-year ASCVD risk score Mikey Bussing DC Jr., et al., 2013) is: 13%   Values used to calculate the score:     Age: 9 years     Sex: Female     Is Non-Hispanic African American: No     Diabetic: No     Tobacco smoker: No     Systolic Blood Pressure: 008 mmHg     Is BP treated: Yes     HDL Cholesterol: 44.7 mg/dL     Total Cholesterol: 136 mg/dL  I reviewed  the patients updated PMH, FH, and SocHx.    Patient Active Problem List   Diagnosis Date Noted  . Serrated adenoma of colon 09/27/2019    Priority: High  . Mixed hyperlipidemia 06/02/2016    Priority: High  . Essential hypertension 07/31/2013    Priority: High  . History of lacunar cerebrovascular accident 09/27/2019    Priority: Medium  . Bilateral primary osteoarthritis of knee 06/05/2017    Priority: Medium  . History of vaginal dysplasia 08/31/2016    Priority: Medium  . Diverticulosis     Priority: Low  . Sensorineural hearing loss (SNHL), bilateral 07/07/2015    Priority: Low  . Rectocele 05/30/2014    Priority: Low  . Tinnitus of both ears 07/07/2015    Allergies: Patient has no known allergies.  Social History: Patient  reports that she has never smoked. She has never used smokeless tobacco. She reports that she does not drink alcohol and does not use drugs.  Current Meds  Medication Sig  . aspirin EC 81 MG tablet Take 1 tablet (81 mg total) by mouth daily.  Marland Kitchen losartan-hydrochlorothiazide (HYZAAR) 100-25 MG tablet Take 1 tablet by mouth daily.  . Multiple Vitamin (MULTIVITAMIN) tablet Take 1 tablet by mouth daily.  . simvastatin (ZOCOR) 20 MG tablet Take 1 tablet (20 mg total) by mouth at bedtime.    Review of Systems: Cardiovascular: negative for chest pain, palpitations, leg swelling, orthopnea Respiratory: negative for SOB, wheezing or persistent cough Gastrointestinal: negative for abdominal pain Genitourinary: negative for dysuria or gross hematuria  Objective  Vitals: BP (!) 152/82   Pulse 61   Temp 98.2 F (36.8 C) (Temporal)   Ht 5' (1.524 m)   Wt 170 lb 9.6 oz (77.4 kg)   SpO2 97%   BMI 33.32 kg/m  General: no acute distress  Psych:  Alert and oriented, normal mood and affect HEENT:  Normocephalic, atraumatic, supple neck  Cardiovascular:  RRR without murmur. no edema Respiratory:  Good breath sounds bilaterally, CTAB with normal respiratory  effort Skin:  Warm, no rashes Neurologic:   Mental status is normal  Commons side effects, risks, benefits, and alternatives for medications and treatment plan prescribed today were discussed, and the patient expressed understanding of the given instructions. Patient is instructed to call or message via MyChart if he/she has any questions or concerns regarding our treatment plan. No barriers to understanding were identified. We discussed Red Flag symptoms and signs in detail. Patient expressed understanding regarding what to do in case of urgent or emergency type symptoms.   Medication list was reconciled, printed and provided to the patient in AVS. Patient instructions and summary information was reviewed with the patient as documented in the AVS. This note was prepared with  assistance of Systems analyst. Occasional wrong-word or sound-a-like substitutions may have occurred due to the inherent limitations of voice recognition software  This visit occurred during the SARS-CoV-2 public health emergency.  Safety protocols were in place, including screening questions prior to the visit, additional usage of staff PPE, and extensive cleaning of exam room while observing appropriate contact time as indicated for disinfecting solutions.

## 2020-01-08 NOTE — Patient Instructions (Addendum)
Please return in 6 months for your annual complete physical; please come fasting.  Continue your medications.  If you have any questions or concerns, please don't hesitate to send me a message via MyChart or call the office at [phone]. Thank you for visiting with [place] today! It's our pleasure caring for you.

## 2020-01-31 ENCOUNTER — Ambulatory Visit (AMBULATORY_SURGERY_CENTER): Payer: Self-pay

## 2020-01-31 ENCOUNTER — Encounter: Payer: Self-pay | Admitting: Gastroenterology

## 2020-01-31 ENCOUNTER — Other Ambulatory Visit: Payer: Self-pay

## 2020-01-31 VITALS — Ht 60.0 in | Wt 173.0 lb

## 2020-01-31 DIAGNOSIS — Z86010 Personal history of colonic polyps: Secondary | ICD-10-CM

## 2020-01-31 MED ORDER — NA SULFATE-K SULFATE-MG SULF 17.5-3.13-1.6 GM/177ML PO SOLN: 1.0000 | Freq: Once | ORAL | 0 refills | Status: AC

## 2020-01-31 NOTE — Progress Notes (Signed)
No allergies to soy or egg Pt is not on blood thinners or diet pills Denies issues with sedation/intubation Denies atrial flutter/fib Denies constipation, but does have occasional issues with hard stool--uses probiotics when this happens.  Goes everyday.  Emmi instructions given to pt  Pt is aware of Covid safety and care partner requirements.   Interpreter used today at the [facility] for this pt.  Interpreter's name is-Sylvia with [place]

## 2020-02-14 ENCOUNTER — Ambulatory Visit (AMBULATORY_SURGERY_CENTER): Payer: Medicare HMO | Admitting: Gastroenterology

## 2020-02-14 ENCOUNTER — Other Ambulatory Visit: Payer: Self-pay

## 2020-02-14 ENCOUNTER — Encounter: Payer: Self-pay | Admitting: Gastroenterology

## 2020-02-14 VITALS — BP 116/82 | HR 61 | Temp 98.9°F | Resp 14 | Ht 60.0 in | Wt 173.0 lb

## 2020-02-14 DIAGNOSIS — Z86010 Personal history of colonic polyps: Secondary | ICD-10-CM | POA: Diagnosis not present

## 2020-02-14 DIAGNOSIS — K635 Polyp of colon: Secondary | ICD-10-CM

## 2020-02-14 DIAGNOSIS — D122 Benign neoplasm of ascending colon: Secondary | ICD-10-CM

## 2020-02-14 MED ORDER — SODIUM CHLORIDE 0.9 % IV SOLN: 500.0000 mL | Freq: Once | INTRAVENOUS | Status: DC

## 2020-02-14 NOTE — Progress Notes (Signed)
pt tolerated well. VSS. awake and to recovery. Report given to RN.  

## 2020-02-14 NOTE — Progress Notes (Signed)
Interpreter Retta Mac at bedside during recovery and discharge instructions.

## 2020-02-14 NOTE — Patient Instructions (Addendum)
YOU HAD AN ENDOSCOPIC PROCEDURE TODAY AT THE [facility]:   Refer to the procedure report that was given to you for any specific questions about what was found during the examination.  If the procedure report does not answer your questions, please call your gastroenterologist to clarify.  If you requested that your care partner not be given the details of your procedure findings, then the procedure report has been included in a sealed envelope for you to review at your convenience later.  YOU SHOULD EXPECT: Some feelings of bloating in the abdomen. Passage of more gas than usual.  Walking can help get rid of the air that was put into your GI tract during the procedure and reduce the bloating. If you had a lower endoscopy (such as a colonoscopy or flexible sigmoidoscopy) you may notice spotting of blood in your stool or on the toilet paper. If you underwent a bowel prep for your procedure, you may not have a normal bowel movement for a few days.  Please Note:  You might notice some irritation and congestion in your nose or some drainage.  This is from the oxygen used during your procedure.  There is no need for concern and it should clear up in a day or so.  SYMPTOMS TO REPORT IMMEDIATELY:   Following lower endoscopy (colonoscopy or flexible sigmoidoscopy):  Excessive amounts of blood in the stool  Significant tenderness or worsening of abdominal pains  Swelling of the abdomen that is new, acute  Fever of 100F or higher   For urgent or emergent issues, a gastroenterologist can be reached at any hour by calling [phone]. Do not use MyChart messaging for urgent concerns.    DIET:  We do recommend a small meal at first, but then you may proceed to your regular diet.  Drink plenty of fluids but you should avoid alcoholic beverages for 24 hours.  MEDICATIONS: Continue present medications.  Please see handouts given to you by your recovery nurse.  ACTIVITY:  You should plan to  take it easy for the rest of today and you should NOT DRIVE or use heavy machinery until tomorrow (because of the sedation medicines used during the test).    FOLLOW UP: Our staff will call the number listed on your records 48-72 hours following your procedure to check on you and address any questions or concerns that you may have regarding the information given to you following your procedure. If we do not reach you, we will leave a message.  We will attempt to reach you two times.  During this call, we will ask if you have developed any symptoms of COVID 19. If you develop any symptoms (ie: fever, flu-like symptoms, shortness of breath, cough etc.) before then, please call [phone].  If you test positive for Covid 19 in the 2 weeks post procedure, please call and report this information to us.    If any biopsies were taken you will be contacted by phone or by letter within the next 1-3 weeks.  Please call us at [phone] if you have not heard about the biopsies in 3 weeks.   Thank you for allowing us to provide for your healthcare needs today.   SIGNATURES/CONFIDENTIALITY: You and/or your care partner have signed paperwork which will be entered into your electronic medical record.  These signatures attest to the fact that that the information above on your After Visit Summary has been reviewed and is understood.  Full responsibility of the   confidentiality of this discharge information lies with you and/or your care-partner.USTED TUVO UN PROCEDIMIENTO ENDOSCPICO HOY EN EL [facility]:   Lea el informe del procedimiento que se le entreg para cualquier pregunta especfica sobre lo que se Primary school teacher.  Si el informe del examen no responde a sus preguntas, por favor llame a su gastroenterlogo para aclararlo.  Si usted solicit que no se le den [employer] de lo que se Estate manager/land agent en su procedimiento al [employer] va a cuidar, entonces el informe del procedimiento se  ha incluido en un sobre sellado para que usted lo revise despus cuando le sea ms conveniente.   LO QUE PUEDE ESPERAR: Algunas sensaciones de hinchazn en el abdomen.  Puede tener ms gases de lo normal.  El caminar puede ayudarle a eliminar el aire que se le puso en el tracto gastrointestinal durante el procedimiento y reducir la hinchazn.  Si le hicieron una endoscopia inferior (como una colonoscopia o una sigmoidoscopia flexible), podra notar manchas de sangre en las heces fecales o en el papel higinico.  Si se someti a una preparacin intestinal para su procedimiento, es posible que no tenga una evacuacin intestinal normal durante [employer].   Tenga en cuenta:  Es posible que note un poco de irritacin y congestin en la nariz o algn drenaje.  Esto es debido al oxgeno [employer] durante su procedimiento.  No hay que preocuparse y esto debe desaparecer ms o Scientist, research (medical).   SNTOMAS PARA REPORTAR INMEDIATAMENTE:  Despus de una endoscopia inferior (colonoscopia o sigmoidoscopia flexible):  Cantidades excesivas de sangre en las heces fecales  Sensibilidad significativa o empeoramiento de los dolores abdominales   Hinchazn aguda del abdomen que antes no tena   Fiebre de 100F o ms   Despus de la endoscopia superior (EGD)  Vmitos de Biochemist, clinical o material como caf molido   Dolor en el pecho o dolor debajo de los omplatos que antes no tena   Dolor o dificultad persistente para tragar  Falta de aire que antes no tena   Fiebre de 100F o ms  Heces fecales negras y pegajosas   Para asuntos urgentes o de Freight forwarder, puede comunicarse con un gastroenterlogo a cualquier hora llamando al [phone].  DIETA:  Recomendamos una comida pequea al principio, pero luego puede continuar con su dieta normal.  Tome muchos lquidos, Teacher, adult education las bebidas alcohlicas durante 24 horas.    ACTIVIDAD:  Debe planear tomarse las cosas con calma por el resto del da y no debe CONDUCIR  ni usar maquinaria pesada Programmer, applications (debido a los medicamentos de sedacin utilizados durante el examen).     SEGUIMIENTO: Nuestro personal llamar al nmero que aparece en su historial al siguiente da hbil de su procedimiento para ver cmo se siente y para responder cualquier pregunta o inquietud que pueda tener con respecto a la informacin que se le dio despus del procedimiento. Si no podemos contactarle, le dejaremos un mensaje.  Sin embargo, si se siente bien y no tiene Paediatric nurse, no es necesario que nos devuelva la llamada.  Asumiremos que ha regresado a sus actividades diarias normales sin incidentes. Si se le tomaron algunas biopsias, le contactaremos por telfono o por carta en las prximas 3 semanas.  Si no ha sabido [employer] biopsias en el transcurso de 3 semanas, por favor llmenos al [phone].   FIRMAS/CONFIDENCIALIDAD: Usted y/o el acompaante que le cuide han firmado documentos que se ingresarn  en su historial mdico electrnico.  Estas firmas atestiguan el hecho de que la informacin anterior

## 2020-02-14 NOTE — Progress Notes (Signed)
Called to room to assist during endoscopic procedure.  Patient ID and intended procedure confirmed with present staff. Received instructions for my participation in the procedure from the performing physician.  

## 2020-02-14 NOTE — Op Note (Signed)
[place] Patient Name: Debra Simon Procedure Date: 02/14/2020 10:09 AM MRN: [id number] Endoscopist: [place]. Loletha Carrow , MD Age: 69 Referring MD:  Date of Birth: 1950-09-29 Gender: Female Account #: [id number] Procedure:                Colonoscopy Indications:              Surveillance: Personal history of colonic polyps                            (unknown histology) on last colonoscopy 5 years ago                            (prior colonoscopy report in [facility] Aug 2016 indicated                            "anorectal" lesion with biopsy read as "serrated                            adenoma") Medicines:                Monitored Anesthesia Care Procedure:                Pre-Anesthesia Assessment:                           - Prior to the procedure, a History and Physical                            was performed, and patient medications and                            allergies were reviewed. The patient's tolerance of                            previous anesthesia was also reviewed. The risks                            and benefits of the procedure and the sedation                            options and risks were discussed with the patient.                            All questions were answered, and informed consent                            was obtained. Prior Anticoagulants: The patient has                            taken no previous anticoagulant or antiplatelet                            agents. ASA Grade Assessment: III - A patient with  severe systemic disease. After reviewing the risks                            and benefits, the patient was deemed in                            satisfactory condition to undergo the procedure.                           After obtaining informed consent, the colonoscope                            was passed under direct vision. Throughout the                            procedure, the patient's blood pressure, pulse,  and                            oxygen saturations were monitored continuously. The                            Colonoscope was introduced through the anus and                            advanced to the the cecum, identified by                            appendiceal orifice and ileocecal valve. The                            colonoscopy was performed without difficulty. The                            patient tolerated the procedure well. The quality                            of the bowel preparation was excellent. The                            ileocecal valve, appendiceal orifice, and rectum                            were photographed. Scope In: 10:23:19 AM Scope Out: 10:37:49 AM Scope Withdrawal Time: 0 hours 10 minutes 21 seconds  Total Procedure Duration: 0 hours 14 minutes 30 seconds  Findings:                 The perianal and digital rectal examinations were                            normal.                           Multiple diverticula were found in the cecum and  left colon.                           A 6 mm polyp was found in the proximal ascending                            colon. The polyp was semi-sessile. The polyp was                            removed with a cold snare. Resection and retrieval                            were complete.                           The exam was otherwise without abnormality on                            direct and retroflexion views. (there was a scar at                            the anal verge indicating probable prior                            hemorrhoidal banding) Complications:            No immediate complications. Estimated Blood Loss:     Estimated blood loss was minimal. Impression:               - Diverticulosis in the cecum and in the left colon.                           - One 6 mm polyp in the proximal ascending colon,                            removed with a cold snare. Resected and retrieved.                            - The examination was otherwise normal on direct                            and retroflexion views. Recommendation:           - Patient has a contact number available for                            emergencies. The signs and symptoms of potential                            delayed complications were discussed with the                            patient. Return to normal activities tomorrow.  Written discharge instructions were provided to the                            patient.                           - Resume previous diet.                           - Continue present medications.                           - Await pathology results.                           - Repeat colonoscopy is recommended for                            surveillance. The colonoscopy date will be                            determined after pathology results from today's                            exam become available for review. Zaivion Kundrat L. Loletha Carrow, MD 02/14/2020 10:46:30 AM This report has been signed electronically.

## 2020-02-14 NOTE — Progress Notes (Signed)
Medical history reviewed, vitals stable assessed by C.W

## 2020-02-18 ENCOUNTER — Telehealth: Payer: Self-pay

## 2020-02-18 NOTE — Telephone Encounter (Signed)
  Follow up Call-  Call back number 02/14/2020  Post procedure Call Back phone  # [phone]  Permission to leave phone message Yes  Some recent data might be hidden     Patient questions:  Do you have a fever, pain , or abdominal swelling? No. Pain Score  0 *  Have you tolerated food without any problems? Yes.    Have you been able to return to your normal activities? Yes.    Do you have any questions about your discharge instructions: Diet   No. Medications  No. Follow up visit  No.  Do you have questions or concerns about your Care? No.  Actions: * If pain score is 4 or above: No action needed, pain <4. I spoke with pt's daughter, she translated questions to her mother.  No problems noted. Maw  1. Have you developed a fever since your procedure? no  2.   Have you had an respiratory symptoms (SOB or cough) since your procedure? no  3.   Have you tested positive for COVID 19 since your procedure no  4.   Have you had any family members/close contacts diagnosed with the COVID 19 since your procedure?  no   If yes to any of these questions please route to Joylene John, RN and Joella Prince, RN

## 2020-02-24 ENCOUNTER — Encounter: Payer: Self-pay | Admitting: Gastroenterology

## 2020-03-05 ENCOUNTER — Other Ambulatory Visit: Payer: Self-pay | Admitting: Family Medicine

## 2020-03-05 DIAGNOSIS — E785 Hyperlipidemia, unspecified: Secondary | ICD-10-CM

## 2020-03-05 DIAGNOSIS — E786 Lipoprotein deficiency: Secondary | ICD-10-CM

## 2020-04-02 ENCOUNTER — Other Ambulatory Visit: Payer: Self-pay

## 2020-04-02 ENCOUNTER — Telehealth: Payer: Self-pay

## 2020-04-02 DIAGNOSIS — E786 Lipoprotein deficiency: Secondary | ICD-10-CM

## 2020-04-02 DIAGNOSIS — E785 Hyperlipidemia, unspecified: Secondary | ICD-10-CM

## 2020-04-02 MED ORDER — SIMVASTATIN 20 MG PO TABS: 20.0000 mg | ORAL_TABLET | Freq: Every day | ORAL | 3 refills | Status: DC

## 2020-04-02 NOTE — Telephone Encounter (Signed)
Refill sent to pharmacy.   

## 2020-04-02 NOTE — Telephone Encounter (Signed)
  LAST APPOINTMENT DATE: 01/08/2020   NEXT APPOINTMENT DATE:@4 /13/2022  MEDICATION:simvastatin (ZOCOR) 20 MG tablet  PHARMACY: [id number] - [place], [place] - [address]   CLINICAL FILLS OUT ALL BELOW:   LAST REFILL:  QTY:  REFILL DATE:    OTHER COMMENTS:    Okay for refill?  Please advise

## 2020-04-14 ENCOUNTER — Other Ambulatory Visit: Payer: Self-pay | Admitting: Family Medicine

## 2020-04-15 ENCOUNTER — Other Ambulatory Visit: Payer: Self-pay

## 2020-04-15 MED ORDER — LOSARTAN POTASSIUM 100 MG PO TABS: 100.0000 mg | ORAL_TABLET | Freq: Every day | ORAL | 0 refills | Status: DC

## 2020-04-15 MED ORDER — HYDROCHLOROTHIAZIDE 25 MG PO TABS: 25.0000 mg | ORAL_TABLET | Freq: Every day | ORAL | 0 refills | Status: DC

## 2020-04-17 ENCOUNTER — Other Ambulatory Visit: Payer: Self-pay | Admitting: Family Medicine

## 2020-06-12 ENCOUNTER — Other Ambulatory Visit: Payer: Self-pay | Admitting: Obstetrics & Gynecology

## 2020-06-12 DIAGNOSIS — Z1231 Encounter for screening mammogram for malignant neoplasm of breast: Secondary | ICD-10-CM

## 2020-06-23 ENCOUNTER — Other Ambulatory Visit: Payer: Self-pay

## 2020-06-23 ENCOUNTER — Ambulatory Visit: Payer: Medicare HMO | Admitting: Obstetrics & Gynecology

## 2020-06-23 ENCOUNTER — Encounter: Payer: Self-pay | Admitting: Obstetrics & Gynecology

## 2020-06-23 VITALS — BP 128/82 | Ht 60.0 in | Wt 171.0 lb

## 2020-06-23 DIAGNOSIS — Z90710 Acquired absence of both cervix and uterus: Secondary | ICD-10-CM

## 2020-06-23 DIAGNOSIS — Z1272 Encounter for screening for malignant neoplasm of vagina: Secondary | ICD-10-CM

## 2020-06-23 DIAGNOSIS — Z780 Asymptomatic menopausal state: Secondary | ICD-10-CM

## 2020-06-23 DIAGNOSIS — Z01419 Encounter for gynecological examination (general) (routine) without abnormal findings: Secondary | ICD-10-CM

## 2020-06-23 DIAGNOSIS — R87628 Other abnormal cytological findings on specimens from vagina: Secondary | ICD-10-CM | POA: Diagnosis not present

## 2020-06-23 DIAGNOSIS — N893 Dysplasia of vagina, unspecified: Secondary | ICD-10-CM

## 2020-06-23 DIAGNOSIS — Z6833 Body mass index (BMI) 33.0-33.9, adult: Secondary | ICD-10-CM

## 2020-06-23 DIAGNOSIS — E6609 Other obesity due to excess calories: Secondary | ICD-10-CM

## 2020-06-23 NOTE — Addendum Note (Signed)
Addended by: Thurnell Garbe A on: 06/23/2020 12:19 PM   Modules accepted: Orders

## 2020-06-23 NOTE — Progress Notes (Signed)
Muna Demers [birth year] [id number]   History:    70 y.o. [id number] Separated  RJ:[id number] presenting for annual gyn exam   HPI: S/P Total Hysterectomy. Postmenopausal, well on no hormone replacement therapy. No pelvic pain. Abstinent. Urine and bowel movements normal. Breastsnormal. Body mass index 33.4. Walking and working in the yard. Had a right knee replacement 3 yrs ago. Health labs with family physician. Colono 01/2020.  BD 03/2017 Normal.   Past medical history,surgical history, family history and social history were all reviewed and documented in the EPIC chart.  Gynecologic History No LMP recorded. Patient has had a hysterectomy.  Obstetric History OB History  Gravida Para Term Preterm AB Living  7 4     3 4   SAB IAB Ectopic Multiple Live Births  3            # Outcome Date GA Lbr Len/2nd Weight Sex Delivery Anes PTL Lv  7 SAB           6 SAB           5 SAB           4 Para           3 Para           2 Para           1 Para              ROS: A ROS was performed and pertinent positives and negatives are included in the history.  GENERAL: No fevers or chills. HEENT: No change in vision, no earache, sore throat or sinus congestion. NECK: No pain or stiffness. CARDIOVASCULAR: No chest pain or pressure. No palpitations. PULMONARY: No shortness of breath, cough or wheeze. GASTROINTESTINAL: No abdominal pain, nausea, vomiting or diarrhea, melena or bright red blood per rectum. GENITOURINARY: No urinary frequency, urgency, hesitancy or dysuria. MUSCULOSKELETAL: No joint or muscle pain, no back pain, no recent trauma. DERMATOLOGIC: No rash, no itching, no lesions. ENDOCRINE: No polyuria, polydipsia, no heat or cold intolerance. No recent change in weight. HEMATOLOGICAL: No anemia or easy bruising or bleeding. NEUROLOGIC: No headache, seizures, numbness, tingling or weakness. PSYCHIATRIC: No depression, no loss of interest in normal activity or change in  sleep pattern.     Exam:   BP 128/82   Ht 5' (1.524 m)   Wt 171 lb (77.6 kg)   BMI 33.40 kg/m   Body mass index is 33.4 kg/m.  General appearance : Well developed well nourished female. No acute distress HEENT: Eyes: no retinal hemorrhage or exudates,  Neck supple, trachea midline, no carotid bruits, no thyroidmegaly Lungs: Clear to auscultation, no rhonchi or wheezes, or rib retractions  Heart: Regular rate and rhythm, no murmurs or gallops Breast:Examined in sitting and supine position were symmetrical in appearance, no palpable masses or tenderness,  no skin retraction, no nipple inversion, no nipple discharge, no skin discoloration, no axillary or supraclavicular lymphadenopathy Abdomen: no palpable masses or tenderness, no rebound or guarding Extremities: no edema or skin discoloration or tenderness  Pelvic: Vulva: Normal             Vagina: No gross lesions or discharge.  Pap reflex done.  Cervix/Uterus absent  Adnexa  Without masses or tenderness  Anus: Normal   Assessment/Plan:  70 y.o. female for annual exam   1. Encounter for Papanicolaou smear of vagina as part of routine gynecological examination Gynecologic exam s/p Total Hysterectomy in menopause.  H/O VAIN1, Pap  reflex done.  Breasts normal.  Screening mammo scheduled 07/27/2020.  Colono 01/2020.  Health labs with Fam MD.  2. S/P total hysterectomy  3. VAIN (vaginal intraepithelial neoplasia) H/O VAIN 1  4. Postmenopausal Well on no HRT.  BD normal in 2019.  5. Class 1 obesity due to excess calories without serious comorbidity with body mass index (BMI) of 33.0 to 33.9 in adult Recommend a lower calorie/carb diet.  Aerobic activities 5 times a week with light weight lifting every 2 days.  Princess Bruins MD, 11:20 AM 06/23/2020

## 2020-06-29 LAB — PAP IG W/ RFLX HPV ASCU

## 2020-07-08 ENCOUNTER — Other Ambulatory Visit: Payer: Self-pay

## 2020-07-08 ENCOUNTER — Ambulatory Visit (INDEPENDENT_AMBULATORY_CARE_PROVIDER_SITE_OTHER): Payer: Medicare HMO | Admitting: Family Medicine

## 2020-07-08 ENCOUNTER — Encounter: Payer: Self-pay | Admitting: Family Medicine

## 2020-07-08 VITALS — BP 132/82 | HR 68 | Temp 98.0°F | Wt 168.8 lb

## 2020-07-08 DIAGNOSIS — Z8673 Personal history of transient ischemic attack (TIA), and cerebral infarction without residual deficits: Secondary | ICD-10-CM

## 2020-07-08 DIAGNOSIS — M170 Bilateral primary osteoarthritis of knee: Secondary | ICD-10-CM

## 2020-07-08 DIAGNOSIS — Z0000 Encounter for general adult medical examination without abnormal findings: Secondary | ICD-10-CM

## 2020-07-08 DIAGNOSIS — I10 Essential (primary) hypertension: Secondary | ICD-10-CM

## 2020-07-08 DIAGNOSIS — H9313 Tinnitus, bilateral: Secondary | ICD-10-CM

## 2020-07-08 DIAGNOSIS — E782 Mixed hyperlipidemia: Secondary | ICD-10-CM

## 2020-07-08 DIAGNOSIS — H903 Sensorineural hearing loss, bilateral: Secondary | ICD-10-CM

## 2020-07-08 DIAGNOSIS — D126 Benign neoplasm of colon, unspecified: Secondary | ICD-10-CM | POA: Diagnosis not present

## 2020-07-08 DIAGNOSIS — I83813 Varicose veins of bilateral lower extremities with pain: Secondary | ICD-10-CM | POA: Insufficient documentation

## 2020-07-08 DIAGNOSIS — K429 Umbilical hernia without obstruction or gangrene: Secondary | ICD-10-CM

## 2020-07-08 LAB — COMPREHENSIVE METABOLIC PANEL
ALT: 15 U/L (ref 0–35)
AST: 19 U/L (ref 0–37)
Albumin: 4.5 g/dL (ref 3.5–5.2)
Alkaline Phosphatase: 63 U/L (ref 39–117)
BUN: 13 mg/dL (ref 6–23)
CO2: 32 mEq/L (ref 19–32)
Calcium: 9.8 mg/dL (ref 8.4–10.5)
Chloride: 100 mEq/L (ref 96–112)
Creatinine, Ser: 0.65 mg/dL (ref 0.40–1.20)
GFR: 90.32 mL/min (ref >60.00)
Glucose, Bld: 98 mg/dL (ref 70–99)
Potassium: 3.5 mEq/L (ref 3.5–5.1)
Sodium: 139 mEq/L (ref 135–145)
Total Bilirubin: 0.7 mg/dL (ref 0.2–1.2)
Total Protein: 7.8 g/dL (ref 6.0–8.3)

## 2020-07-08 LAB — LIPID PANEL
Cholesterol: 102 mg/dL (ref 0–200)
HDL: 44.5 mg/dL (ref >39.00)
LDL Cholesterol: 42 mg/dL (ref 0–99)
NonHDL: 57.93
Total CHOL/HDL Ratio: 2
Triglycerides: 80 mg/dL (ref 0.0–149.0)
VLDL: 16 mg/dL (ref 0.0–40.0)

## 2020-07-08 LAB — CBC WITH DIFFERENTIAL/PLATELET
Basophils Absolute: 0 10*3/uL (ref 0.0–0.1)
Basophils Relative: 0.2 % (ref 0.0–3.0)
Eosinophils Absolute: 0.1 10*3/uL (ref 0.0–0.7)
Eosinophils Relative: 2.3 % (ref 0.0–5.0)
HCT: 40 % (ref 36.0–46.0)
Hemoglobin: 13.5 g/dL (ref 12.0–15.0)
Lymphocytes Relative: 26.9 % (ref 12.0–46.0)
Lymphs Abs: 1.5 10*3/uL (ref 0.7–4.0)
MCHC: 33.7 g/dL (ref 30.0–36.0)
MCV: 88.4 fl (ref 78.0–100.0)
Monocytes Absolute: 0.3 10*3/uL (ref 0.1–1.0)
Monocytes Relative: 6 % (ref 3.0–12.0)
Neutro Abs: 3.6 10*3/uL (ref 1.4–7.7)
Neutrophils Relative %: 64.6 % (ref 43.0–77.0)
Platelets: 210 10*3/uL (ref 150.0–400.0)
RBC: 4.52 Mil/uL (ref 3.87–5.11)
RDW: 13.2 % (ref 11.5–15.5)
WBC: 5.6 10*3/uL (ref 4.0–10.5)

## 2020-07-08 NOTE — Progress Notes (Signed)
Subjective  Chief Complaint  Patient presents with  . Annual Exam    Fasting     HPI: Debra Simon is a 70 y.o. female who presents to [facility] today for a Female Wellness Visit. She also has the concerns and/or needs as listed above in the chief complaint. These will be addressed in addition to the Health Maintenance Visit.   Wellness Visit: annual visit with health maintenance review and exam with and without Pap   Health maintenance: Mammogram is scheduled for next month.  Patient is up-to-date.  Feels well.  No new concerns.  Immunizations are up-to-date. Chronic disease f/u and/or acute problem visit: (deemed necessary to be done in addition to the wellness visit):  Hypertension: Patient is taking losartan HCTZ 100/25 daily.  Verified with pharmacy.  Home blood pressures reported as 130s over 80s consistently.  Feels well.  No chest pain or shortness of breath.  No lower extremity edema.  Elevated blood pressure in the office today which is unusual.  Hyperlipidemia on simvastatin nightly and tolerates well.  Fasting for recheck.  Goal LDL less than 70 given history of CVA.  History of CVA: No further TIA or neuro symptoms.  No residual effects.  Complains of burning sensation in the lower extremity, worse on the left.  In the area of her varicose veins.  No redness or warmth.  No weakness.  Hearing loss and tinnitus: No changes.  Is stable knee osteoarthritis.  No oral medications indicated.  Small umbilical hernia without pain.  Outpatient Encounter Medications as of 07/08/2020  Medication Sig  . aspirin EC 81 MG tablet Take 1 tablet (81 mg total) by mouth daily.  . hydrochlorothiazide (HYDRODIURIL) 25 MG tablet TAKE 1 TABLET (25 MG TOTAL) BY MOUTH DAILY.  Marland Kitchen losartan (COZAAR) 100 MG tablet Take 1 tablet (100 mg total) by mouth daily.  . Multiple Vitamin (MULTIVITAMIN) tablet Take 1 tablet by mouth daily.  . simvastatin (ZOCOR) 20 MG tablet  Take 1 tablet (20 mg total) by mouth at bedtime.  . [DISCONTINUED] 0.9 %  sodium chloride infusion    No facility-administered encounter medications on file as of 07/08/2020.   Lab Results  Component Value Date   CHOL 136 09/27/2019   HDL 44.70 09/27/2019   LDLCALC 73 09/27/2019   TRIG 94.0 09/27/2019   CHOLHDL 3 09/27/2019    Assessment  1. Annual physical exam   2. Essential hypertension   3. Mixed hyperlipidemia   4. Serrated adenoma of colon   5. History of lacunar cerebrovascular accident   6. Bilateral primary osteoarthritis of knee   7. Sensorineural hearing loss (SNHL), bilateral   8. Tinnitus of both ears   9. Varicose veins of both lower extremities with pain   10. Umbilical hernia without obstruction and without gangrene      Plan  Female Wellness Visit:  Age appropriate Health Maintenance and Prevention measures were discussed with patient. Included topics are cancer screening recommendations, ways to keep healthy (see AVS) including dietary and exercise recommendations, regular eye and dental care, use of seat belts, and avoidance of moderate alcohol use and tobacco use.  Screens are up-to-date, mammogram scheduled for May  BMI: discussed patient's BMI and encouraged positive lifestyle modifications to help get to or maintain a target BMI.  HM needs and immunizations were addressed and ordered. See below for orders. See HM and immunization section for updates.  Routine labs and screening tests ordered including cmp, cbc  and lipids where appropriate.  Discussed recommendations regarding Vit D and calcium supplementation (see AVS)  Chronic disease management visit and/or acute problem visit:  Hypertension: Good control by home reports.  Elevated today.  Continue current medications recheck 3 months.  Continue to monitor.  Check renal function electrolytes  Hyperlipidemia: Recheck fasting lipids and increase to higher intensity statin if LDL not at goal, less than  70.  History of CVA: Monitor neuro symptoms.  Need excellent blood pressure control.  Varicose veins for pain: Reassured.  Support hose.  Follow-up if worsening.  Small reducible umbilical hernia: Monitor for pain or growth.   Follow up: 3 months for blood pressure recheck Orders Placed This Encounter  Procedures  . CBC with Differential/Platelet  . Comprehensive metabolic panel  . Lipid panel   No orders of the defined types were placed in this encounter.     Body mass index is 32.97 kg/m. Wt Readings from Last 3 Encounters:  07/08/20 168 lb 12.8 oz (76.6 kg)  06/23/20 171 lb (77.6 kg)  02/14/20 173 lb (78.5 kg)     Patient Active Problem List   Diagnosis Date Noted  . Serrated adenoma of colon 09/27/2019    Priority: High    Colonoscopy 2016, performed [place], recommend 5-year follow-up Colonoscopy August 2021, serrated adenoma   . Mixed hyperlipidemia 06/02/2016    Priority: High  . Essential hypertension 07/31/2013    Priority: High  . History of lacunar cerebrovascular accident 09/27/2019    Priority: Medium    MRI 06/23/15 when working up dizziness   . Bilateral primary osteoarthritis of knee 06/05/2017    Priority: Medium  . History of vaginal dysplasia 08/31/2016    Priority: Medium  . Diverticulosis     Priority: Low  . Sensorineural hearing loss (SNHL), bilateral 07/07/2015    Priority: Low  . Rectocele 05/30/2014    Priority: Low  . Varicose veins of both lower extremities with pain 07/08/2020  . Umbilical hernia without obstruction and without gangrene 07/08/2020  . Tinnitus of both ears 07/07/2015   Health Maintenance  Topic Date Due  . MAMMOGRAM  06/10/2020  . INFLUENZA VACCINE  10/26/2020  . DEXA SCAN  04/18/2022  . COLONOSCOPY (Pts 45-73yrs Insurance coverage will need to be confirmed)  02/13/2025  . TETANUS/TDAP  01/23/2028  . COVID-19 Vaccine  Completed  . Hepatitis C Screening  Completed  . PNA vac Low Risk Adult  Completed  .  HPV VACCINES  Aged Out   Immunization History  Administered Date(s) Administered  . Fluad Quad(high Dose 65+) 01/08/2020  . Influenza, High Dose Seasonal PF 12/26/2017  . Influenza,inj,Quad PF,6+ Mos 01/09/2019  . Moderna Sars-Covid-2 Vaccination 07/26/2019, 08/23/2019  . PFIZER(Purple Top)SARS-COV-2 Vaccination 03/05/2020  . Pneumococcal Conjugate-13 12/26/2017  . Pneumococcal Polysaccharide-23 09/27/2019  . Tdap 01/22/2018  . Zoster Recombinat (Shingrix) 07/22/2016, 11/18/2016   We updated and reviewed the patient's past history in detail and it is documented below. Allergies: Patient has No Known Allergies. Past Medical History Patient  has a past medical history of Anxiety, Arthritis, Colon polyps (6237), Diastolic dysfunction, left ventricle (06/26/2016), Diverticulosis, GERD (gastroesophageal reflux disease), Heart murmur, Hyperlipidemia, Hypertension, Mild mitral regurgitation (07/04/2016), Primary localized osteoarthritis of right knee (05/24/2017), Ureterovaginal fistula, Uterine fibroid (1994), and Vaginal dysplasia. Past Surgical History Patient  has a past surgical history that includes Cystoscopy; Ureteral exploration (04/16/1992); Esophagogastroduodenoscopy (2010); Breast cyst aspiration (Right); Breast excisional biopsy (Right); Total abdominal hysterectomy (1994); Total knee arthroplasty (Right, 06/05/2017); Total knee  arthroplasty (Right, 06/05/2017); Ueterovaginal fistual repair; Colonoscopy (2010); Colonoscopy (2012); and Colonoscopy (2016). Family History: Patient family history includes Brain cancer in her father; Breast cancer in her paternal aunt; Diabetes in her brother, mother, and sister; Hypertension in her mother and sister; Uterine cancer in her paternal aunt. Social History:  Patient  reports that she has never smoked. She has never used smokeless tobacco. She reports that she does not drink alcohol and does not use drugs.  Review of Systems: Constitutional:  negative for fever or malaise Ophthalmic: negative for photophobia, double vision or loss of vision Cardiovascular: negative for chest pain, dyspnea on exertion, or new LE swelling Respiratory: negative for SOB or persistent cough Gastrointestinal: negative for abdominal pain, change in bowel habits or melena Genitourinary: negative for dysuria or gross hematuria, no abnormal uterine bleeding or disharge Musculoskeletal: negative for new gait disturbance or muscular weakness Integumentary: negative for new or persistent rashes, no breast lumps Neurological: negative for TIA or stroke symptoms Psychiatric: negative for SI or delusions Allergic/Immunologic: negative for hives  Patient Care Team    Relationship Specialty Notifications Start End  Leamon Arnt, MD PCP - General Family Medicine  09/27/19   Elsie Saas, MD Consulting Physician Orthopedic Surgery  08/09/18   Princess Bruins, MD Consulting Physician Obstetrics and Gynecology  09/27/19     Objective  Vitals: BP 132/82 Comment: By consistent home readings.  Pulse 68   Temp 98 F (36.7 C) (Temporal)   Wt 168 lb 12.8 oz (76.6 kg)   SpO2 98%   BMI 32.97 kg/m  General:  Well developed, well nourished, no acute distress  Psych:  Alert and orientedx3,normal mood and affect HEENT:  Normocephalic, atraumatic, non-icteric sclera,  supple neck without adenopathy, mass or thyromegaly Cardiovascular:  Normal S1, S2, RRR without gallop, rub or murmur Respiratory:  Good breath sounds bilaterally, CTAB with normal respiratory effort Gastrointestinal: normal bowel sounds, soft, non-tender, no noted masses. No HSM, small reducible umbilical hernia without tenderness MSK: no deformities, contusions. Joints are without erythema or swelling.  Large varicosities bilateral lower extremities, left greater than right, no erythema warmth or tenderness Skin:  Warm, no rashes or suspicious lesions noted Neurologic:    Mental status is normal. CN  2-11 are normal. Gross motor and sensory exams are normal. Normal gait. No tremor Breast Exam: No mass, skin retraction or nipple discharge is appreciated in either breast. No axillary adenopathy. Fibrocystic changes are not noted    Commons side effects, risks, benefits, and alternatives for medications and treatment plan prescribed today were discussed, and the patient expressed understanding of the given instructions. Patient is instructed to call or message via MyChart if he/she has any questions or concerns regarding our treatment plan. No barriers to understanding were identified. We discussed Red Flag symptoms and signs in detail. Patient expressed understanding regarding what to do in case of urgent or emergency type symptoms.   Medication list was reconciled, printed and provided to the patient in AVS. Patient instructions and summary information was reviewed with the patient as documented in the AVS. This note was prepared with assistance of Dragon voice recognition software. Occasional wrong-word or sound-a-like substitutions may have occurred due to the inherent limitations of voice recognition software  This visit occurred during the SARS-CoV-2 public health emergency.  Safety protocols were in place, including screening questions prior to the visit, additional usage of staff PPE, and extensive cleaning of exam room while observing appropriate contact time as indicated for disinfecting solutions.

## 2020-07-08 NOTE — Patient Instructions (Signed)
Please return in 3 months for hypertension follow up.  I will release your lab results to you on your MyChart account with further instructions. Please reply with any questions.   If you have any questions or concerns, please don't hesitate to send me a message via MyChart or call the office at [phone]. Thank you for visiting with [place] today! It's our pleasure caring for you.

## 2020-07-13 NOTE — Progress Notes (Signed)
Please call patient: I have reviewed his/her lab results. All labs are stable. Follow up as directed.

## 2020-08-06 ENCOUNTER — Other Ambulatory Visit: Payer: Self-pay

## 2020-08-06 ENCOUNTER — Ambulatory Visit
Admission: RE | Admit: 2020-08-06 | Discharge: 2020-08-06 | Disposition: A | Discharge status: Home / Self Care | Payer: Medicare HMO | Source: Ambulatory Visit | Attending: Obstetrics & Gynecology | Admitting: Obstetrics & Gynecology

## 2020-08-06 DIAGNOSIS — Z1231 Encounter for screening mammogram for malignant neoplasm of breast: Secondary | ICD-10-CM | POA: Diagnosis not present

## 2020-08-07 ENCOUNTER — Other Ambulatory Visit: Payer: Self-pay | Admitting: Obstetrics & Gynecology

## 2020-08-07 DIAGNOSIS — R928 Other abnormal and inconclusive findings on diagnostic imaging of breast: Secondary | ICD-10-CM

## 2020-08-28 ENCOUNTER — Ambulatory Visit
Admission: RE | Admit: 2020-08-28 | Discharge: 2020-08-28 | Disposition: A | Discharge status: Home / Self Care | Payer: Medicare HMO | Source: Ambulatory Visit | Attending: Obstetrics & Gynecology | Admitting: Obstetrics & Gynecology

## 2020-08-28 ENCOUNTER — Other Ambulatory Visit: Payer: Self-pay | Admitting: Obstetrics & Gynecology

## 2020-08-28 DIAGNOSIS — R928 Other abnormal and inconclusive findings on diagnostic imaging of breast: Secondary | ICD-10-CM

## 2020-08-28 DIAGNOSIS — N6324 Unspecified lump in the left breast, lower inner quadrant: Secondary | ICD-10-CM | POA: Diagnosis not present

## 2020-08-28 DIAGNOSIS — N6323 Unspecified lump in the left breast, lower outer quadrant: Secondary | ICD-10-CM | POA: Diagnosis not present

## 2020-09-03 ENCOUNTER — Other Ambulatory Visit: Payer: Self-pay

## 2020-09-03 ENCOUNTER — Other Ambulatory Visit: Payer: Self-pay | Admitting: Obstetrics & Gynecology

## 2020-09-03 ENCOUNTER — Ambulatory Visit
Admission: RE | Admit: 2020-09-03 | Discharge: 2020-09-03 | Disposition: A | Discharge status: Home / Self Care | Payer: Medicare HMO | Source: Ambulatory Visit | Attending: Obstetrics & Gynecology | Admitting: Obstetrics & Gynecology

## 2020-09-03 DIAGNOSIS — R928 Other abnormal and inconclusive findings on diagnostic imaging of breast: Secondary | ICD-10-CM

## 2020-09-03 DIAGNOSIS — Z170 Estrogen receptor positive status [ER+]: Secondary | ICD-10-CM | POA: Diagnosis not present

## 2020-09-03 DIAGNOSIS — C50812 Malignant neoplasm of overlapping sites of left female breast: Secondary | ICD-10-CM | POA: Diagnosis not present

## 2020-09-03 DIAGNOSIS — N6325 Unspecified lump in the left breast, overlapping quadrants: Secondary | ICD-10-CM | POA: Diagnosis not present

## 2020-09-04 ENCOUNTER — Telehealth: Payer: Self-pay | Admitting: Oncology

## 2020-09-04 NOTE — Telephone Encounter (Signed)
Spoke to patient through an interpreter to confirm morning [facility] appointment for 6/15, paperwork will be mailed to patient

## 2020-09-07 ENCOUNTER — Encounter: Payer: Self-pay | Admitting: *Deleted

## 2020-09-07 ENCOUNTER — Other Ambulatory Visit: Payer: Self-pay | Admitting: *Deleted

## 2020-09-07 DIAGNOSIS — Z170 Estrogen receptor positive status [ER+]: Secondary | ICD-10-CM | POA: Insufficient documentation

## 2020-09-07 DIAGNOSIS — C50512 Malignant neoplasm of lower-outer quadrant of left female breast: Secondary | ICD-10-CM | POA: Insufficient documentation

## 2020-09-08 NOTE — Progress Notes (Signed)
[place]  Telephone:([phone]) [phone] Fax:(336) [phone]     ID: Davielle Lingelbach DOB: April 03, 1950  MR#: [id number]  JYN#:[id number]  Patient Care Team: Leamon Arnt, MD as PCP - General (Family Medicine) Elsie Saas, MD as Consulting Physician (Orthopedic Surgery) Princess Bruins, MD as Consulting Physician (Obstetrics and Gynecology) Rockwell Germany, RN as Oncology Nurse Navigator Tressie Ellis, Paulette Blanch, RN as Oncology Nurse Navigator Stark Klein, MD as Consulting Physician (General Surgery) Windie Marasco, Virgie Dad, MD as Consulting Physician (Oncology) Gery Pray, MD as Consulting Physician (Radiation Oncology) Chauncey Cruel, MD OTHER MD:  CHIEF COMPLAINT: estrogen receptor positive breast cancer  CURRENT TREATMENT: awaiting definitive surgery   HISTORY OF CURRENT ILLNESS: Humna Moorehouse had routine screening mammography on 08/06/2020 showing a possible abnormality in the left breast. She underwent left diagnostic mammography with tomography and left breast ultrasonography at [facility] on 08/28/2020 showing: breast density category C; 11 mm left breast mass at 6 o'clock, retroareolar; no left axillary lymphadenopathy.  Accordingly on 09/03/2020 she proceeded to biopsy of the left breast area in question. The pathology from this procedure ([id number]) showed: invasive ductal carcinoma, grade 1-2; ductal carcinoma in situ. Prognostic indicators significant for: estrogen receptor, >95% positive and progesterone receptor, >95% positive, both with strong staining intensity. Proliferation marker Ki67 at 15%. HER2 negative by immunohistochemistry (1+).  Cancer Staging Malignant neoplasm of lower-outer quadrant of left breast of female, estrogen receptor positive ([place]) Staging form: Breast, AJCC 8th Edition - Clinical: Stage IA (cT1c, cN0, cM0, G2, ER+, PR+, HER2-) - Signed by Chauncey Cruel, MD on 09/08/2020 Histologic grading system: 3 grade system  The patient's  subsequent history is as detailed below.   INTERVAL HISTORY: Latima was evaluated in the multidisciplinary breast cancer clinic on 09/09/2020 accompanied by her daughter Candace Gallus. Her case was also presented at the multidisciplinary breast cancer conference on the same day. At that time a preliminary plan was proposed: Genetics testing, breast conserving surgery, Oncotype, adjuvant radiation, antiestrogens   REVIEW OF SYSTEMS: There were no specific symptoms leading to the original mammogram, which was routinely scheduled. On the provided questionnaire, Sharell reports loss of sleep, wearing glasses, hearing loss, tinnitus, palpitations, poor circulation, hernia, incontinence, easy bruising, arthritis, vertigo, and anxiety. The patient denies unusual headaches, visual changes, nausea, vomiting, stiff neck, dizziness, or gait imbalance. There has been no cough, phlegm production, or pleurisy, no chest pain or pressure, and no change in bowel or bladder habits. The patient denies fever, rash, bleeding, unexplained fatigue or unexplained weight loss. A detailed review of systems was otherwise entirely negative.   COVID 19 VACCINATION STATUS: fully vaccinated, Moderna x2 with [employer] booster 02/2020   PAST MEDICAL HISTORY: Past Medical History:  Diagnosis Date   Anxiety    Arthritis    Breast cancer ([place])    Colon polyps 2952   Diastolic dysfunction, left ventricle 06/26/2016   (ECHO 2018): Left ventricle: The cavity size was normal. Systolic function was normal. The estimated ejection fraction was in the range of 60% to 65%. Wall motion was normal; there were no regional wall motion abnormalities. Doppler parameters are consistent with abnormal left ventricular relaxation (grade 1 diastolic dysfunction).   Diverticulosis    GERD (gastroesophageal reflux disease)    watches what she eats   Heart murmur    dx last yr.   had echo 05/2016   Hyperlipidemia    Hypertension    Mild mitral  regurgitation 07/04/2016   Primary localized osteoarthritis of right knee  05/24/2017   Ureterovaginal fistula    Uterine fibroid 1994   Vaginal dysplasia     PAST SURGICAL HISTORY: Past Surgical History:  Procedure Laterality Date   BREAST CYST ASPIRATION Right    BREAST EXCISIONAL BIOPSY Right    COLONOSCOPY  2010   COLONOSCOPY  2012   COLONOSCOPY  2016   CYSTOSCOPY     ESOPHAGOGASTRODUODENOSCOPY  2010   TOTAL ABDOMINAL HYSTERECTOMY  1994   TOTAL KNEE ARTHROPLASTY Right 06/05/2017   TOTAL KNEE ARTHROPLASTY Right 06/05/2017   Procedure: RIGHT TOTAL KNEE ARTHROPLASTY;  Surgeon: Elsie Saas, MD;  Location: [facility];  Service: Orthopedics;  Laterality: Right;   Ueterovaginal fistual repair     URETERAL EXPLORATION  04/16/1992   URETEROTOMY ON THE RIGHT, REPAIR LACERATION ON THE LEFT    FAMILY HISTORY: Family History  Problem Relation Age of Onset   Diabetes Mother    Hypertension Mother    Brain cancer Father    Hypertension Sister    Diabetes Sister    Diabetes Brother    Breast cancer Paternal Aunt    Uterine cancer Paternal Aunt    Pancreatic cancer Cousin    Uterine cancer Cousin    CAD Neg Hx    Stroke Neg Hx    Colon cancer Neg Hx    Colon polyps Neg Hx    Esophageal cancer Neg Hx    Stomach cancer Neg Hx    Rectal cancer Neg Hx    Her father died at age 53 from brain cancer. Her mother is 64 years old as of 08/2020. Naidelin has two brothers and two sisters. She reports breast cancer in a paternal aunt (over age 21), uterine cancer in a paternal aunt (>50) and a maternal cousin (74's), and pancreatic cancer in a maternal cousin (age 99).   GYNECOLOGIC HISTORY:  No LMP recorded. Patient has had a hysterectomy. Menarche: 70 years old Age at first live birth: 70 years old [place] P 4 LMP 02/1992, with hysterectomy Contraceptive: never used HRT never used  Hysterectomy? Yes, 02/1992 BSO? Yes?   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 Husband Bland Span works in [employer], now part time. She lives at home with her husband. Their daughter Kristopher Oppenheim, age 53, is a Psychologist, sport and exercise at [employer] here in [place]. Son Peter Garter, age 38, works in transit at a train station in [place]. Son Mckinley Jewel, age 80, is a Tour manager here in [place]. Son Johnsie Cancel, age 19, is a Tour manager in [place]. She attends a [employer].    ADVANCED DIRECTIVES: not in place. In the absence of any documentation to the contrary, the patient's spouse is their HCPOA. She intends to name her son Mckinley Jewel as her HCPOA.   HEALTH MAINTENANCE: Social History   Tobacco Use   Smoking status: Never   Smokeless tobacco: Never  Vaping Use   Vaping Use: Never used  Substance Use Topics   Alcohol use: No    Alcohol/week: 0.0 standard drinks    Comment: rare on holidays   Drug use: No     Colonoscopy: 01/2020 (Dr. Loletha Carrow), recall 2026  PAP: 05/2020, negative  Bone density: 03/2017, -0.1   No Known Allergies  Current Outpatient Medications  Medication Sig Dispense Refill   aspirin EC 81 MG tablet Take 1 tablet (81 mg total) by mouth daily.     hydrochlorothiazide (HYDRODIURIL) 25 MG tablet TAKE 1 TABLET (  25 MG TOTAL) BY MOUTH DAILY. 90 tablet 1   losartan (COZAAR) 100 MG tablet Take 1 tablet (100 mg total) by mouth daily. 60 tablet 0   Multiple Vitamin (MULTIVITAMIN) tablet Take 1 tablet by mouth daily.     simvastatin (ZOCOR) 20 MG tablet Take 1 tablet (20 mg total) by mouth at bedtime. 90 tablet 3   No current facility-administered medications for this visit.    OBJECTIVE: Spanish speaker in no acute distress  Vitals:   09/09/20 0837  BP: (!) 148/70  Pulse: 75  Resp: 18  Temp: (!) 97.4 F (36.3 C)  SpO2: 100%     Body mass index is 33.47 kg/m.   Wt Readings from Last 3 Encounters:  09/09/20 171 lb 6.4 oz (77.7 kg)  07/08/20 168 lb 12.8 oz (76.6 kg)  06/23/20 171 lb (77.6 kg)       ECOG FS:1 - Symptomatic but completely ambulatory  Ocular: Sclerae unicteric, pupils round and equal Ear-nose-throat: Wearing a mask Lymphatic: No cervical or supraclavicular adenopathy Lungs no rales or rhonchi Heart regular rate and rhythm Abd soft, nontender, positive bowel sounds MSK no focal spinal tenderness, no joint edema Neuro: non-focal, well-oriented, appropriate affect Breasts: The right breast has a palpable mass which has been previously evaluated and is known to be a cyst.  The left breast is status post recent biopsy with a moderate ecchymosis.  Both axillae are benign.   LAB RESULTS:  CMP     Component Value Date/Time   NA 140 09/09/2020 0812   K 3.5 09/09/2020 0812   CL 102 09/09/2020 0812   CO2 27 09/09/2020 0812   GLUCOSE 113 (H) 09/09/2020 0812   BUN 15 09/09/2020 0812   CREATININE 0.69 09/09/2020 0812   CREATININE 0.57 06/01/2016 1018   CALCIUM 9.5 09/09/2020 0812   CALCIUM 9.0 09/11/2012 0000   PROT 7.4 09/09/2020 0812   ALBUMIN 4.1 09/09/2020 0812   AST 17 09/09/2020 0812   ALT 16 09/09/2020 0812   ALKPHOS 87 09/09/2020 0812   ALKPHOS 78 09/11/2012 0000   BILITOT 0.4 09/09/2020 0812   GFRNONAA >60 09/09/2020 0812   GFRAA >60 06/06/2017 0404    No results found for: TOTALPROTELP, ALBUMINELP, A1GS, A2GS, BETS, BETA2SER, GAMS, MSPIKE, SPEI  Lab Results  Component Value Date   WBC 6.4 09/09/2020   NEUTROABS 4.0 09/09/2020   HGB 13.0 09/09/2020   HCT 39.3 09/09/2020   MCV 88.5 09/09/2020   PLT 234 09/09/2020    No results found for: LABCA2  No components found for: [id number]  No results for input(s): INR in the last 168 hours.  No results found for: LABCA2  No results found for: MWN027  No results found for: OZD664  No results found for: QIH474  No results found for: CA2729  No components found for: HGQUANT  No results found for: CEA1 / No results found for: CEA1   No results found for: AFPTUMOR  No results found for:  CHROMOGRNA  No results found for: KPAFRELGTCHN, LAMBDASER, KAPLAMBRATIO (kappa/lambda light chains)  No results found for: HGBA, HGBA2QUANT, HGBFQUANT, HGBSQUAN (Hemoglobinopathy evaluation)   No results found for: LDH  No results found for: IRON, TIBC, IRONPCTSAT (Iron and TIBC)  No results found for: FERRITIN  Urinalysis    Component Value Date/Time   COLORURINE YELLOW 09/27/2019 [place] 09/27/2019 1040   LABSPEC 1.015 09/27/2019 1040   PHURINE 8.0 09/27/2019 1040   GLUCOSEU NEGATIVE 09/27/2019 1040   HGBUR  NEGATIVE 09/27/2019 1040   BILIRUBINUR NEGATIVE 09/27/2019 1040   BILIRUBINUR Negative 03/14/2017 0944   [place] 09/27/2019 1040   PROTEINUR NEGATIVE 01/09/2019 1117   UROBILINOGEN 0.2 09/27/2019 1040   NITRITE NEGATIVE 09/27/2019 1040   LEUKOCYTESUR NEGATIVE 09/27/2019 1040     STUDIES: US BREAST LTD UNI LEFT INC AXILLA  Result Date: 08/28/2020 CLINICAL DATA:  70 year old female recalled from screening mammogram dated 08/06/2020 for a possible left breast focal asymmetry. EXAM: DIGITAL DIAGNOSTIC UNILATERAL LEFT MAMMOGRAM WITH TOMOSYNTHESIS AND CAD; ULTRASOUND LEFT BREAST LIMITED TECHNIQUE: Left digital diagnostic mammography and breast tomosynthesis was performed. The images were evaluated with computer-aided detection.; Targeted ultrasound examination of the left breast was performed COMPARISON:  Previous exam(s). ACR Breast Density Category c: The breast tissue is heterogeneously dense, which may obscure small masses. FINDINGS: There is a persistent focal asymmetry with associated distortion in the deep central left breast. Further evaluation with ultrasound was performed. Targeted ultrasound is performed, showing an irregular hypoechoic mass with associated architectural distortion of the surrounding tissue at the 6 o'clock retroareolar position. There are so seated circumscribed hypoechoic masses which may represent cysts. Overall, the  irregular mass measures 11 x 10 x 7 mm. There is associated vascularity. This likely corresponds with the mammographic finding. Evaluation of the left axilla demonstrates no suspicious lymphadenopathy. IMPRESSION: 1. Suspicious left breast mass likely corresponding with the screening mammographic findings. Recommendation is for ultrasound-guided biopsy. 2. No suspicious left axillary lymphadenopathy. RECOMMENDATION: Ultrasound-guided biopsy of the left breast with close attention on post clip films to ensure mammographic correlation. If this does not correlate, additional stereotactic biopsy is recommended. I have discussed the findings and recommendations with the patient. If applicable, a reminder letter will be sent to the patient regarding the next appointment. BI-RADS CATEGORY  4: Suspicious. Electronically Signed   By: Kristopher Oppenheim M.D.   On: 08/28/2020 10:42   MM DIAG BREAST TOMO UNI LEFT  Result Date: 08/28/2020 CLINICAL DATA:  70 year old female recalled from screening mammogram dated 08/06/2020 for a possible left breast focal asymmetry. EXAM: DIGITAL DIAGNOSTIC UNILATERAL LEFT MAMMOGRAM WITH TOMOSYNTHESIS AND CAD; ULTRASOUND LEFT BREAST LIMITED TECHNIQUE: Left digital diagnostic mammography and breast tomosynthesis was performed. The images were evaluated with computer-aided detection.; Targeted ultrasound examination of the left breast was performed COMPARISON:  Previous exam(s). ACR Breast Density Category c: The breast tissue is heterogeneously dense, which may obscure small masses. FINDINGS: There is a persistent focal asymmetry with associated distortion in the deep central left breast. Further evaluation with ultrasound was performed. Targeted ultrasound is performed, showing an irregular hypoechoic mass with associated architectural distortion of the surrounding tissue at the 6 o'clock retroareolar position. There are so seated circumscribed hypoechoic masses which may represent cysts.  Overall, the irregular mass measures 11 x 10 x 7 mm. There is associated vascularity. This likely corresponds with the mammographic finding. Evaluation of the left axilla demonstrates no suspicious lymphadenopathy. IMPRESSION: 1. Suspicious left breast mass likely corresponding with the screening mammographic findings. Recommendation is for ultrasound-guided biopsy. 2. No suspicious left axillary lymphadenopathy. RECOMMENDATION: Ultrasound-guided biopsy of the left breast with close attention on post clip films to ensure mammographic correlation. If this does not correlate, additional stereotactic biopsy is recommended. I have discussed the findings and recommendations with the patient. If applicable, a reminder letter will be sent to the patient regarding the next appointment. BI-RADS CATEGORY  4: Suspicious. Electronically Signed   By: Kristopher Oppenheim M.D.   On: 08/28/2020 10:42   MM  CLIP PLACEMENT LEFT  Result Date: 09/03/2020 CLINICAL DATA:  Status post ultrasound-guided core biopsy of mass in the 6 o'clock location of the LEFT breast. EXAM: 3D DIAGNOSTIC LEFT MAMMOGRAM POST ULTRASOUND BIOPSY COMPARISON:  Previous exam(s). FINDINGS: 3D Mammographic images were obtained following ultrasound guided biopsy of mass in the 6 o'clock location of the LEFT breast and placement of a ribbon shaped clip. The biopsy marking clip is in expected position at the site of biopsy. IMPRESSION: Appropriate positioning of the ribbon shaped biopsy marking clip at the site of biopsy in the distortion and mass in the LOWER central LEFT breast. Final Assessment: Post Procedure Mammograms for Marker Placement Electronically Signed   By: Nolon Nations M.D.   On: 09/03/2020 13:32  [place] LT BREAST BX W LOC DEV 1ST LESION IMG BX SPEC US GUIDE  Addendum Date: 09/04/2020   ADDENDUM REPORT: 09/04/2020 12:38 ADDENDUM: Pathology revealed GRADE I-II INVASIVE DUCTAL CARCINOMA, DUCTAL CARCINOMA IN SITU of the Left breast, 6 o'clock 1 CMFN.  This was found to be concordant by Dr. Nolon Nations. Pathology results were discussed with the patient by telephone by Kathrine Haddock, Bilingual Patient Services Representative. The patient reported doing well after the biopsy with tenderness at the site. Post biopsy instructions and care were reviewed and questions were answered. The patient was encouraged to call [facility] for any additional concerns. The patient was referred to [facility] on September 09, 2020. Pathology results reported by Terie Purser, RN on 09/04/2020. Electronically Signed   By: Nolon Nations M.D.   On: 09/04/2020 12:38   Result Date: 09/04/2020 CLINICAL DATA:  Patient presents for ultrasound-guided core biopsy of the LEFT breast. EXAM: ULTRASOUND GUIDED LEFT BREAST CORE NEEDLE BIOPSY COMPARISON:  Previous exam(s). PROCEDURE: I met with the patient and we discussed the procedure of ultrasound-guided biopsy, including benefits and alternatives. We discussed the high likelihood of a successful procedure. We discussed the risks of the procedure, including infection, bleeding, tissue injury, clip migration, and inadequate sampling. Informed written consent was given. The usual time-out protocol was performed immediately prior to the procedure. Lesion quadrant: LEFT breast 6 o'clock Using sterile technique and 1% Lidocaine as local anesthetic, under direct ultrasound visualization, a 12 gauge coaxial spring-loaded device was used to perform biopsy of mass in the 6 o'clock location of the LEFT breast 1 centimeter from nipple using a LATERAL to MEDIAL approach. At the conclusion of the procedure a ribbon shaped tissue marker clip was deployed into the biopsy cavity. Follow up 2 view mammogram was performed and dictated separately. IMPRESSION: Ultrasound guided biopsy of LEFT breast mass. No apparent complications. Electronically Signed:  By: Nolon Nations M.D. On: 09/03/2020 13:30    ELIGIBLE FOR AVAILABLE RESEARCH PROTOCOL: No  ASSESSMENT: 70 y.o. [place] woman s/p Left breast lower outer quadrant biopsy 09/03/2020 for a clinical T1c N0, stage IA invasive ductal carcinoma, grade I-II, estrogen and progesterone receptor positive, HER2 not amplified, with an Mib-1 of 15%  (1) genetics testing  (2) definitive surgery pending  (3) Oncotype to be obtained from the definitive surgical sample  (4) adjuvant radiation  (5) antiestrogens  PLAN: I met today with Shavaun to review her new diagnosis. Specifically we discussed the biology of her breast cancer, its diagnosis, staging, treatment  options and prognosis.We first reviewed the fact that cancer is not one disease but more than 100 different diseases and that it is important to  keep them separate-- otherwise when friends and relatives discuss their own cancer experiences with Mikhaela confusion can result. Similarly we explained that if breast cancer spreads to the bone or liver, the patient would not have bone cancer or liver cancer, but breast cancer in the bone and breast cancer in the liver: one cancer in three places-- not 3 different cancers which otherwise would have to be treated in 3 different ways.  We discussed the difference between local and systemic therapy. In terms of loco-regional treatment, lumpectomy plus radiation is equivalent to mastectomy as far as survival is concerned. For this reason, and because the cosmetic results are generally superior, we recommend breast conserving surgery.   We then discussed the rationale for systemic therapy. There is some risk that this cancer may have already spread to other parts of her body. Patients frequently ask at this point about bone scans, CAT scans and PET scans to find out if they have occult breast cancer somewhere else. The problem is that in early stage disease we are much more likely to find false positives  then true cancers and this would expose the patient to unnecessary procedures as well as unnecessary radiation. Scans cannot answer the question the patient really would like to know, which is whether she has microscopic disease elsewhere in her body. For those reasons we do not recommend them.  Of course we would proceed to aggressive evaluation of any symptoms that might suggest metastatic disease, but that is not the case here.  Next we went over the options for systemic therapy which are anti-estrogens, anti-HER-2 immunotherapy, and chemotherapy. Bari does not meet criteria for anti-HER-2 immunotherapy. She is a good candidate for anti-estrogens.  The question of chemotherapy is more complicated. Chemotherapy is most effective in rapidly growing, aggressive tumors. It is much less effective in low-grade, slow growing cancers, like Ellarae 's. For that reason we are going to request an Oncotype from the definitive surgical sample, as suggested by NCCN guidelines. That will help [place] make a definitive decision regarding chemotherapy in this case.  The patient also qualifies for genetics testing In patients who carry a deleterious mutation [for example in a  BRCA gene], the risk of a new breast cancer developing in the future may be sufficiently great that the patient may choose bilateral mastectomies. However if she wishes to keep her breasts in that situation it is safe to do so. That would require intensified screening, which generally means not only yearly mammography but a yearly breast MRI as well.   Accordingly the overall plan is for genetics testing, definitive surgery, Oncotype testing, likely no chemotherapy, then adjuvant radiation and antiestrogens.  Dellamae has a good understanding of the overall plan. She agrees with it. She knows the goal of treatment in her case is cure. She will call with any problems that may develop before her next visit here.  Total encounter time 65  minutes.Sarajane Jews C. Derris Millan, MD 09/09/2020 11:22 AM Medical Oncology and Hematology [facility] [place], [place] [address] Tel. [phone]    Fax. [phone]   This document serves as a record of services personally performed by Lurline Del, MD. It was created on his behalf by Wilburn Mylar, a trained medical scribe. The creation of this record is based on the scribe's personal observations and the provider's statements to them.   I, Lurline Del MD, have reviewed the above documentation for accuracy and completeness, and I agree with the  above.    *Total Encounter Time as defined by the Centers for Medicare and Medicaid Services includes, in addition to the face-to-face time of a patient visit (documented in the note above) non-face-to-face time: obtaining and reviewing outside history, ordering and reviewing medications, tests or procedures, care coordination (communications with other health care professionals or caregivers) and documentation in the medical record.

## 2020-09-09 ENCOUNTER — Other Ambulatory Visit: Payer: Self-pay

## 2020-09-09 ENCOUNTER — Inpatient Hospital Stay: Payer: Medicare HMO | Attending: Oncology

## 2020-09-09 ENCOUNTER — Ambulatory Visit: Payer: Medicare HMO | Attending: General Surgery | Admitting: Physical Therapy

## 2020-09-09 ENCOUNTER — Ambulatory Visit (HOSPITAL_BASED_OUTPATIENT_CLINIC_OR_DEPARTMENT_OTHER): Payer: Medicare HMO | Admitting: Genetic Counselor

## 2020-09-09 ENCOUNTER — Encounter: Payer: Self-pay | Admitting: Physical Therapy

## 2020-09-09 ENCOUNTER — Encounter: Payer: Self-pay | Admitting: *Deleted

## 2020-09-09 ENCOUNTER — Inpatient Hospital Stay (HOSPITAL_BASED_OUTPATIENT_CLINIC_OR_DEPARTMENT_OTHER): Payer: Medicare HMO | Admitting: Oncology

## 2020-09-09 ENCOUNTER — Ambulatory Visit
Admission: RE | Admit: 2020-09-09 | Discharge: 2020-09-09 | Disposition: A | Discharge status: Home / Self Care | Payer: Medicare HMO | Source: Ambulatory Visit | Attending: Radiation Oncology | Admitting: Radiation Oncology

## 2020-09-09 ENCOUNTER — Encounter: Payer: Self-pay | Admitting: Oncology

## 2020-09-09 ENCOUNTER — Encounter: Payer: Self-pay | Admitting: Licensed Clinical Social Worker

## 2020-09-09 VITALS — BP 148/70 | HR 75 | Temp 97.4°F | Resp 18 | Ht 60.0 in | Wt 171.4 lb

## 2020-09-09 DIAGNOSIS — Z803 Family history of malignant neoplasm of breast: Secondary | ICD-10-CM | POA: Diagnosis not present

## 2020-09-09 DIAGNOSIS — Z808 Family history of malignant neoplasm of other organs or systems: Secondary | ICD-10-CM | POA: Insufficient documentation

## 2020-09-09 DIAGNOSIS — R293 Abnormal posture: Secondary | ICD-10-CM | POA: Insufficient documentation

## 2020-09-09 DIAGNOSIS — Z809 Family history of malignant neoplasm, unspecified: Secondary | ICD-10-CM | POA: Diagnosis not present

## 2020-09-09 DIAGNOSIS — H540X33 Blindness right eye category 3, blindness left eye category 3: Secondary | ICD-10-CM | POA: Insufficient documentation

## 2020-09-09 DIAGNOSIS — Z800 Family history of malignant neoplasm of digestive organs: Secondary | ICD-10-CM

## 2020-09-09 DIAGNOSIS — C50512 Malignant neoplasm of lower-outer quadrant of left female breast: Secondary | ICD-10-CM

## 2020-09-09 DIAGNOSIS — Z8049 Family history of malignant neoplasm of other genital organs: Secondary | ICD-10-CM

## 2020-09-09 DIAGNOSIS — Z170 Estrogen receptor positive status [ER+]: Secondary | ICD-10-CM

## 2020-09-09 LAB — CBC WITH DIFFERENTIAL (CANCER CENTER ONLY)
Abs Immature Granulocytes: 0.01 10*3/uL (ref 0.00–0.07)
Basophils Absolute: 0 10*3/uL (ref 0.0–0.1)
Basophils Relative: 0 %
Eosinophils Absolute: 0.2 10*3/uL (ref 0.0–0.5)
Eosinophils Relative: 3 %
HCT: 39.3 % (ref 36.0–46.0)
Hemoglobin: 13 g/dL (ref 12.0–15.0)
Immature Granulocytes: 0 %
Lymphocytes Relative: 27 %
Lymphs Abs: 1.8 10*3/uL (ref 0.7–4.0)
MCH: 29.3 pg (ref 26.0–34.0)
MCHC: 33.1 g/dL (ref 30.0–36.0)
MCV: 88.5 fL (ref 80.0–100.0)
Monocytes Absolute: 0.5 10*3/uL (ref 0.1–1.0)
Monocytes Relative: 7 %
Neutro Abs: 4 10*3/uL (ref 1.7–7.7)
Neutrophils Relative %: 63 %
Platelet Count: 234 10*3/uL (ref 150–400)
RBC: 4.44 MIL/uL (ref 3.87–5.11)
RDW: 12.6 % (ref 11.5–15.5)
WBC Count: 6.4 10*3/uL (ref 4.0–10.5)
nRBC: 0 % (ref 0.0–0.2)

## 2020-09-09 LAB — CMP (CANCER CENTER ONLY)
ALT: 16 U/L (ref 0–44)
AST: 17 U/L (ref 15–41)
Albumin: 4.1 g/dL (ref 3.5–5.0)
Alkaline Phosphatase: 87 U/L (ref 38–126)
Anion gap: 11 (ref 5–15)
BUN: 15 mg/dL (ref 8–23)
CO2: 27 mmol/L (ref 22–32)
Calcium: 9.5 mg/dL (ref 8.9–10.3)
Chloride: 102 mmol/L (ref 98–111)
Creatinine: 0.69 mg/dL (ref 0.44–1.00)
GFR, Estimated: >60 mL/min (ref >60)
Glucose, Bld: 113 mg/dL — ABNORMAL HIGH (ref 70–99)
Potassium: 3.5 mmol/L (ref 3.5–5.1)
Sodium: 140 mmol/L (ref 135–145)
Total Bilirubin: 0.4 mg/dL (ref 0.3–1.2)
Total Protein: 7.4 g/dL (ref 6.5–8.1)

## 2020-09-09 LAB — GENETIC SCREENING ORDER

## 2020-09-09 NOTE — Progress Notes (Addendum)
Radiation Oncology         [phone] ________________________________  Multidisciplinary Breast Oncology Clinic [facility]) Initial Outpatient Consultation  Name: Debra Simon MRN: [id number]  Date: 09/09/2020  DOB: 11/22/1950  CC:Leamon Arnt, MD  Stark Klein, MD   REFERRING PHYSICIAN: Stark Klein, MD  DIAGNOSIS: Stage Ia (T1c Nx)  Left Breast LOQ, Invasive Ductal Carcinoma, ER+ / PR+ / Her2-, Grade 1    HISTORY OF PRESENT ILLNESS::Debra Simon is a 70 y.o. female who is presenting to the office today for evaluation of her newly diagnosed breast cancer. She is accompanied by her daughter and a [place] interpreter. She is doing well overall.   She had routine screening mammography on 08/28/20 showing a possible abnormality in the left breast. She underwent left breast diagnostic mammography with tomography and left breast ultrasonography at [facility] on 08/28/20 showing: a suspicious left breast mass likely corresponding with the screening mammographic findings. (Of note: Diagnostic right mammography from 06/28/19 initially showed a benign cyst in the retroareolar region of the right breast. )  Biopsy on 09/03/20 showed: invasive ductal carcinoma. Prognostic indicators significant for: estrogen receptor, 95%% positive and progesterone receptor, 95% positive, both with strong staining intensity. Proliferation marker Ki67 at 15%%. HER2 negative.  Menarche: 70 years old Age at first live birth: 70 years old GP: 4 LMP: 63 Contraceptive: None HRT: None   The patient was referred today for presentation in the multidisciplinary conference.  Radiology studies and pathology slides were presented there for review and discussion of treatment options.  A consensus was discussed regarding potential next steps.  PREVIOUS RADIATION THERAPY: No  PAST MEDICAL HISTORY:  Past Medical History:  Diagnosis Date   Anxiety    Arthritis    Breast cancer ([place])    Colon polyps 8299    Diastolic dysfunction, left ventricle 06/26/2016   (ECHO 2018): Left ventricle: The cavity size was normal. Systolic function was normal. The estimated ejection fraction was in the range of 60% to 65%. Wall motion was normal; there were no regional wall motion abnormalities. Doppler parameters are consistent with abnormal left ventricular relaxation (grade 1 diastolic dysfunction).   Diverticulosis    GERD (gastroesophageal reflux disease)    watches what she eats   Heart murmur    dx last yr.   had echo 05/2016   Hyperlipidemia    Hypertension    Mild mitral regurgitation 07/04/2016   Primary localized osteoarthritis of right knee 05/24/2017   Ureterovaginal fistula    Uterine fibroid 1994   Vaginal dysplasia     PAST SURGICAL HISTORY: Past Surgical History:  Procedure Laterality Date   BREAST CYST ASPIRATION Right    BREAST EXCISIONAL BIOPSY Right    COLONOSCOPY  2010   COLONOSCOPY  2012   COLONOSCOPY  2016   CYSTOSCOPY     ESOPHAGOGASTRODUODENOSCOPY  2010   TOTAL ABDOMINAL HYSTERECTOMY  1994   TOTAL KNEE ARTHROPLASTY Right 06/05/2017   TOTAL KNEE ARTHROPLASTY Right 06/05/2017   Procedure: RIGHT TOTAL KNEE ARTHROPLASTY;  Surgeon: Elsie Saas, MD;  Location: [place];  Service: Orthopedics;  Laterality: Right;   Ueterovaginal fistual repair     URETERAL EXPLORATION  04/16/1992   URETEROTOMY ON THE RIGHT, REPAIR LACERATION ON THE LEFT    FAMILY HISTORY:  Family History  Problem Relation Age of Onset   Diabetes Mother    Hypertension Mother    Brain cancer Father    Hypertension Sister    Diabetes Sister    Diabetes  Brother    Breast cancer Paternal Aunt    Uterine cancer Paternal Aunt    Pancreatic cancer Cousin    Uterine cancer Cousin    CAD Neg Hx    Stroke Neg Hx    Colon cancer Neg Hx    Colon polyps Neg Hx    Esophageal cancer Neg Hx    Stomach cancer Neg Hx    Rectal cancer Neg Hx     SOCIAL HISTORY:  Social History   Socioeconomic History   Marital  status: Married    Spouse name: Not on file   Number of children: Not on file   Years of education: Not on file   Highest education level: GED or equivalent  Occupational History   Occupation: Housewife  Tobacco Use   Smoking status: Never   Smokeless tobacco: Never  Vaping Use   Vaping Use: Never used  Substance and Sexual Activity   Alcohol use: No    Alcohol/week: 0.0 standard drinks    Comment: rare on holidays   Drug use: No   Sexual activity: Never  Other Topics Concern   Not on file  Social History Narrative   Not on file   Social Determinants of Health   Financial Resource Strain: Low Risk    Difficulty of Paying Living Expenses: Not hard at all  Food Insecurity: No Food Insecurity   Worried About Charity fundraiser in the Last Year: Never true   Ran Out of Food in the Last Year: Never true  Transportation Needs: No Transportation Needs   Lack of Transportation (Medical): No   Lack of Transportation (Non-Medical): No  Physical Activity: Inactive   Days of Exercise per Week: 0 days   Minutes of Exercise per Session: 0 min  Stress: No Stress Concern Present   Feeling of Stress : Not at all  Social Connections: Moderately Integrated   Frequency of Communication with Friends and Family: More than three times a week   Frequency of Social Gatherings with Friends and Family: Twice a week   Attends Religious Services: More than 4 times per year   Active Member of [employer] or Organizations: No   Attends Archivist Meetings: Never   Marital Status: Married    ALLERGIES: No Known Allergies  MEDICATIONS:  Current Outpatient Medications  Medication Sig Dispense Refill   aspirin EC 81 MG tablet Take 1 tablet (81 mg total) by mouth daily.     hydrochlorothiazide (HYDRODIURIL) 25 MG tablet TAKE 1 TABLET (25 MG TOTAL) BY MOUTH DAILY. 90 tablet 1   losartan (COZAAR) 100 MG tablet Take 1 tablet (100 mg total) by mouth daily. 60 tablet 0   Multiple Vitamin  (MULTIVITAMIN) tablet Take 1 tablet by mouth daily.     simvastatin (ZOCOR) 20 MG tablet Take 1 tablet (20 mg total) by mouth at bedtime. 90 tablet 3   No current facility-administered medications for this encounter.    REVIEW OF SYSTEMS: A 10+ POINT REVIEW OF SYSTEMS WAS OBTAINED including neurology, dermatology, psychiatry, cardiac, respiratory, lymph, extremities, GI, GU, musculoskeletal, constitutional, reproductive, HEENT. On the provided form, she reports wearing glasses, hearing loss, ringing in ears, palpitations, poor circulation, hernia, incontinence, bruising easily, arthritis, vertigo, and anxiety. She denies having any other symptoms.    PHYSICAL EXAM:   Lungs are clear to auscultation bilaterally. Heart has regular rate and rhythm. No palpable cervical, supraclavicular, or axillary adenopathy. Abdomen soft, non-tender, normal bowel sounds. Breast: Right breast with palpable cyst  in upper inner aspect around areolar border, measures approximately 1 and a half to 2 cm, she has no nipple discharge, or bleeding. Left breast with stero- strips in place in the LOQ with some bruising in this area. No palpable mass, nipple discharge, or bleeding.   Vitals with BMI 09/09/2020  Height $Remov'5\' 0"'tzRXma$   Weight 171 lbs 6 oz  BMI 26.94  Systolic 854  Diastolic 70  Pulse 75    KPS = 100  100 - Normal; no complaints; no evidence of disease. 90   - Able to carry on normal activity; minor signs or symptoms of disease. 80   - Normal activity with effort; some signs or symptoms of disease. 38   - Cares for self; unable to carry on normal activity or to do active work. 60   - Requires occasional assistance, but is able to care for most of his personal needs. 50   - Requires considerable assistance and frequent medical care. 63   - Disabled; requires special care and assistance. 72   - Severely disabled; hospital admission is indicated although death not imminent. 95   - Very sick; hospital admission  necessary; active supportive treatment necessary. 10   - Moribund; fatal processes progressing rapidly. 0     - Dead  Karnofsky DA, Abelmann [place], Craver LS and [facility]chenal [facility] [phone]) The use of the nitrogen mustards in the palliative treatment of carcinoma: with particular reference to bronchogenic carcinoma Cancer 1 634-56  LABORATORY DATA:  Lab Results  Component Value Date   WBC 6.4 09/09/2020   HGB 13.0 09/09/2020   HCT 39.3 09/09/2020   MCV 88.5 09/09/2020   PLT 234 09/09/2020   Lab Results  Component Value Date   NA 140 09/09/2020   K 3.5 09/09/2020   CL 102 09/09/2020   CO2 27 09/09/2020   Lab Results  Component Value Date   ALT 16 09/09/2020   AST 17 09/09/2020   ALKPHOS 87 09/09/2020   BILITOT 0.4 09/09/2020    PULMONARY FUNCTION TEST:   Recent Review Flowsheet Data   There is no flowsheet data to display.     RADIOGRAPHY: US BREAST LTD UNI LEFT INC AXILLA  Result Date: 08/28/2020 CLINICAL DATA:  70 year old female recalled from screening mammogram dated 08/06/2020 for a possible left breast focal asymmetry. EXAM: DIGITAL DIAGNOSTIC UNILATERAL LEFT MAMMOGRAM WITH TOMOSYNTHESIS AND CAD; ULTRASOUND LEFT BREAST LIMITED TECHNIQUE: Left digital diagnostic mammography and breast tomosynthesis was performed. The images were evaluated with computer-aided detection.; Targeted ultrasound examination of the left breast was performed COMPARISON:  Previous exam(s). ACR Breast Density Category c: The breast tissue is heterogeneously dense, which may obscure small masses. FINDINGS: There is a persistent focal asymmetry with associated distortion in the deep central left breast. Further evaluation with ultrasound was performed. Targeted ultrasound is performed, showing an irregular hypoechoic mass with associated architectural distortion of the surrounding tissue at the 6 o'clock retroareolar position. There are so seated circumscribed hypoechoic masses which may represent cysts. Overall,  the irregular mass measures 11 x 10 x 7 mm. There is associated vascularity. This likely corresponds with the mammographic finding. Evaluation of the left axilla demonstrates no suspicious lymphadenopathy. IMPRESSION: 1. Suspicious left breast mass likely corresponding with the screening mammographic findings. Recommendation is for ultrasound-guided biopsy. 2. No suspicious left axillary lymphadenopathy. RECOMMENDATION: Ultrasound-guided biopsy of the left breast with close attention on post clip films to ensure mammographic correlation. If this does not correlate, additional stereotactic biopsy is recommended. I  have discussed the findings and recommendations with the patient. If applicable, a reminder letter will be sent to the patient regarding the next appointment. BI-RADS CATEGORY  4: Suspicious. Electronically Signed   By: Kristopher Oppenheim M.D.   On: 08/28/2020 10:42   MM DIAG BREAST TOMO UNI LEFT  Result Date: 08/28/2020 CLINICAL DATA:  70 year old female recalled from screening mammogram dated 08/06/2020 for a possible left breast focal asymmetry. EXAM: DIGITAL DIAGNOSTIC UNILATERAL LEFT MAMMOGRAM WITH TOMOSYNTHESIS AND CAD; ULTRASOUND LEFT BREAST LIMITED TECHNIQUE: Left digital diagnostic mammography and breast tomosynthesis was performed. The images were evaluated with computer-aided detection.; Targeted ultrasound examination of the left breast was performed COMPARISON:  Previous exam(s). ACR Breast Density Category c: The breast tissue is heterogeneously dense, which may obscure small masses. FINDINGS: There is a persistent focal asymmetry with associated distortion in the deep central left breast. Further evaluation with ultrasound was performed. Targeted ultrasound is performed, showing an irregular hypoechoic mass with associated architectural distortion of the surrounding tissue at the 6 o'clock retroareolar position. There are so seated circumscribed hypoechoic masses which may represent cysts.  Overall, the irregular mass measures 11 x 10 x 7 mm. There is associated vascularity. This likely corresponds with the mammographic finding. Evaluation of the left axilla demonstrates no suspicious lymphadenopathy. IMPRESSION: 1. Suspicious left breast mass likely corresponding with the screening mammographic findings. Recommendation is for ultrasound-guided biopsy. 2. No suspicious left axillary lymphadenopathy. RECOMMENDATION: Ultrasound-guided biopsy of the left breast with close attention on post clip films to ensure mammographic correlation. If this does not correlate, additional stereotactic biopsy is recommended. I have discussed the findings and recommendations with the patient. If applicable, a reminder letter will be sent to the patient regarding the next appointment. BI-RADS CATEGORY  4: Suspicious. Electronically Signed   By: Kristopher Oppenheim M.D.   On: 08/28/2020 10:42   MM CLIP PLACEMENT LEFT  Result Date: 09/03/2020 CLINICAL DATA:  Status post ultrasound-guided core biopsy of mass in the 6 o'clock location of the LEFT breast. EXAM: 3D DIAGNOSTIC LEFT MAMMOGRAM POST ULTRASOUND BIOPSY COMPARISON:  Previous exam(s). FINDINGS: 3D Mammographic images were obtained following ultrasound guided biopsy of mass in the 6 o'clock location of the LEFT breast and placement of a ribbon shaped clip. The biopsy marking clip is in expected position at the site of biopsy. IMPRESSION: Appropriate positioning of the ribbon shaped biopsy marking clip at the site of biopsy in the distortion and mass in the LOWER central LEFT breast. Final Assessment: Post Procedure Mammograms for Marker Placement Electronically Signed   By: Nolon Nations M.D.   On: 09/03/2020 13:32  [place] LT BREAST BX W LOC DEV 1ST LESION IMG BX SPEC US GUIDE  Addendum Date: 09/04/2020   ADDENDUM REPORT: 09/04/2020 12:38 ADDENDUM: Pathology revealed GRADE I-II INVASIVE DUCTAL CARCINOMA, DUCTAL CARCINOMA IN SITU of the Left breast, 6 o'clock 1 CMFN.  This was found to be concordant by Dr. Nolon Nations. Pathology results were discussed with the patient by telephone by Kathrine Haddock, Bilingual Patient Services Representative. The patient reported doing well after the biopsy with tenderness at the site. Post biopsy instructions and care were reviewed and questions were answered. The patient was encouraged to call [facility] for any additional concerns. The patient was referred to [facility] on September 09, 2020. Pathology results reported by Terie Purser, RN on 09/04/2020. Electronically Signed   By: Nolon Nations M.D.  On: 09/04/2020 12:38   Result Date: 09/04/2020 CLINICAL DATA:  Patient presents for ultrasound-guided core biopsy of the LEFT breast. EXAM: ULTRASOUND GUIDED LEFT BREAST CORE NEEDLE BIOPSY COMPARISON:  Previous exam(s). PROCEDURE: I met with the patient and we discussed the procedure of ultrasound-guided biopsy, including benefits and alternatives. We discussed the high likelihood of a successful procedure. We discussed the risks of the procedure, including infection, bleeding, tissue injury, clip migration, and inadequate sampling. Informed written consent was given. The usual time-out protocol was performed immediately prior to the procedure. Lesion quadrant: LEFT breast 6 o'clock Using sterile technique and 1% Lidocaine as local anesthetic, under direct ultrasound visualization, a 12 gauge coaxial spring-loaded device was used to perform biopsy of mass in the 6 o'clock location of the LEFT breast 1 centimeter from nipple using a LATERAL to MEDIAL approach. At the conclusion of the procedure a ribbon shaped tissue marker clip was deployed into the biopsy cavity. Follow up 2 view mammogram was performed and dictated separately. IMPRESSION: Ultrasound guided biopsy of LEFT breast mass. No apparent complications. Electronically Signed:  By: Nolon Nations M.D. On: 09/03/2020 13:30     IMPRESSION: Stage Ia (T1c Nx)  Left Breast LOQ, Invasive Ductal Carcinoma, ER+ / PR+ / Her2-, Grade 1    Patient will be a good candidate for breast conservation with radiotherapy to left breast. We discussed the general course of radiation, potential side effects, and toxicities with radiation and the patient is interested in this approach. Patient has a family history of breast cancer as well as other cancers. She would qualify for genetic testing and wished to proceed with this evaluation. If genetic testing is positive, she may wish to have mastectomy or bilateral mastectomy.    PLAN:  Genetics  Left lumpectomy and sentinel lymph evaluation Oncotype testing   Adjuvant radiation therapy Aromatase inhibitor      ------------------------------------------------  Blair Promise, PhD, MD  This document serves as a record of services personally performed by Gery Pray, MD. It was created on his behalf by Roney Mans, a trained medical scribe. The creation of this record is based on the scribe's personal observations and the provider's statements to them. This document has been checked and approved by the attending provider.

## 2020-09-09 NOTE — Patient Instructions (Signed)

## 2020-09-09 NOTE — Therapy (Signed)
[place], [place], [address] Phone: [phone]   Fax:  [phone]  Physical Therapy Evaluation  Patient Details  Name: Debra Simon MRN: [id number] Date of Birth: [birth year] Referring Provider (PT): Dr. Stark Klein   Encounter Date: 09/09/2020   PT End of Session - 09/09/20 1158     Visit Number 1    Number of Visits 2    Date for PT Re-Evaluation 11/04/20    PT Start Time 2094    PT Stop Time 1114    PT Time Calculation (min) 29 min    Activity Tolerance Patient tolerated treatment well    Behavior During Therapy [facility] for tasks assessed/performed             Past Medical History:  Diagnosis Date   Anxiety    Arthritis    Breast cancer ([place])    Colon polyps 7096   Diastolic dysfunction, left ventricle 06/26/2016   (ECHO 2018): Left ventricle: The cavity size was normal. Systolic function was normal. The estimated ejection fraction was in the range of 60% to 65%. Wall motion was normal; there were no regional wall motion abnormalities. Doppler parameters are consistent with abnormal left ventricular relaxation (grade 1 diastolic dysfunction).   Diverticulosis    GERD (gastroesophageal reflux disease)    watches what she eats   Heart murmur    dx last yr.   had echo 05/2016   Hyperlipidemia    Hypertension    Mild mitral regurgitation 07/04/2016   Primary localized osteoarthritis of right knee 05/24/2017   Ureterovaginal fistula    Uterine fibroid 1994   Vaginal dysplasia     Past Surgical History:  Procedure Laterality Date   BREAST CYST ASPIRATION Right    BREAST EXCISIONAL BIOPSY Right    COLONOSCOPY  2010   COLONOSCOPY  2012   COLONOSCOPY  2016   CYSTOSCOPY     ESOPHAGOGASTRODUODENOSCOPY  2010   TOTAL ABDOMINAL HYSTERECTOMY  1994   TOTAL KNEE ARTHROPLASTY Right 06/05/2017   TOTAL KNEE ARTHROPLASTY Right 06/05/2017   Procedure: RIGHT TOTAL KNEE ARTHROPLASTY;  Surgeon:  Elsie Saas, MD;  Location: [facility];  Service: Orthopedics;  Laterality: Right;   Ueterovaginal fistual repair     URETERAL EXPLORATION  04/16/1992   URETEROTOMY ON THE RIGHT, REPAIR LACERATION ON THE LEFT    There were no vitals filed for this visit.    Subjective Assessment - 09/09/20 1153     Subjective Patient reports she is here today to be seen by her medical team for her newly diagnosed left breast cancer.    Patient is accompained by: Family member;Interpreter    Pertinent History Patient was diagnosed on 08/28/2020 with left grade I-II invasive ductal carcinoma breast cancer. It measures 1.1 cm and is located in the lower outer quadrant. It is ER/PR positive and HER2 negative with a Ki67 of 15%.    Patient Stated Goals reduce lymphedema risk and learn post op shoulder ROM HEP    Currently in Pain? No/denies                [facility] PT Assessment - 09/09/20 0001       Assessment   Medical Diagnosis Left breast cancer    Referring Provider (PT) Dr. Stark Klein    Onset Date/Surgical Date 08/28/20    Hand Dominance Right    Prior Therapy none      Precautions   Precautions Other (comment)    Precaution  Comments active cancer      Restrictions   Weight Bearing Restrictions No      Balance Screen   Has the patient fallen in the past 6 months No    Has the patient had a decrease in activity level because of a fear of falling?  No    Is the patient reluctant to leave their home because of a fear of falling?  No      Home Ecologist residence    Living Arrangements Spouse/significant other    Available Help at Discharge Family      Prior Function   Level of [place] Retired    Leisure She does not exercise      Cognition   Overall Cognitive Status Within Functional Limits for tasks assessed      Posture/Postural Control   Posture/Postural Control Postural limitations    Postural Limitations Forward  head;Rounded Shoulders      ROM / Strength   AROM / PROM / Strength AROM;Strength      AROM   Overall AROM Comments Cervical AROM is WNL    AROM Assessment Site Shoulder    Right/Left Shoulder Right;Left    Right Shoulder Extension 48 Degrees    Right Shoulder Flexion 160 Degrees    Right Shoulder ABduction 171 Degrees    Right Shoulder Internal Rotation 56 Degrees    Right Shoulder External Rotation 86 Degrees    Left Shoulder Extension 47 Degrees    Left Shoulder Flexion 161 Degrees    Left Shoulder ABduction 167 Degrees    Left Shoulder Internal Rotation 82 Degrees    Left Shoulder External Rotation 80 Degrees      Strength   Overall Strength Within functional limits for tasks performed               LYMPHEDEMA/ONCOLOGY QUESTIONNAIRE - 09/09/20 0001       Type   Cancer Type Left breast cancer      Lymphedema Assessments   Lymphedema Assessments Upper extremities      Right Upper Extremity Lymphedema   10 cm Proximal to Olecranon Process 33.1 cm    Olecranon Process 26.1 cm    10 cm Proximal to Ulnar Styloid Process 22.2 cm    Just Proximal to Ulnar Styloid Process 15.8 cm    Across Hand at [employer] 17.9 cm    At [place] of 2nd Digit 6.2 cm      Left Upper Extremity Lymphedema   10 cm Proximal to Olecranon Process 33.2 cm    Olecranon Process 25.3 cm    10 cm Proximal to Ulnar Styloid Process 23 cm    Just Proximal to Ulnar Styloid Process 16.2 cm    Across Hand at [employer] 18.4 cm    At [place] of 2nd Digit 6.1 cm             L-DEX FLOWSHEETS - 09/09/20 1100       L-DEX LYMPHEDEMA SCREENING   Measurement Type Unilateral    L-DEX MEASUREMENT EXTREMITY Upper Extremity    POSITION  Standing    DOMINANT SIDE Right    At Risk Side Left    BASELINE SCORE (UNILATERAL) 2.3             The patient was assessed using the L-Dex machine today to produce a lymphedema index baseline score. The patient will be reassessed on a regular basis  (typically  every 3 months) to obtain new L-Dex scores. If the score is > 6.5 points away from his/her baseline score indicating onset of subclinical lymphedema, it will be recommended to wear a compression garment for 4 weeks, 12 hours per day and then be reassessed. If the score continues to be > 6.5 points from baseline at reassessment, we will initiate lymphedema treatment. Assessing in this manner has a 95% rate of preventing clinically significant lymphedema.      Katina Dung - 09/09/20 0001     Open a tight or new jar No difficulty    Do heavy household chores (wash walls, wash floors) No difficulty    Carry a shopping bag or briefcase No difficulty    Wash your back No difficulty    Use a knife to cut food No difficulty    Recreational activities in which you take some force or impact through your arm, shoulder, or hand (golf, hammering, tennis) No difficulty    During the past week, to what extent has your arm, shoulder or hand problem interfered with your normal social activities with family, friends, neighbors, or groups? Not at all    During the past week, to what extent has your arm, shoulder or hand problem limited your work or other regular daily activities Not at all    Arm, shoulder, or hand pain. None    Tingling (pins and needles) in your arm, shoulder, or hand None    Difficulty Sleeping No difficulty    DASH Score 0 %              Objective measurements completed on examination: See above findings.     Patient was instructed today in a home exercise program today for post op shoulder range of motion. These included active assist shoulder flexion in sitting, scapular retraction, wall walking with shoulder abduction, and hands behind head external rotation.  She was encouraged to do these twice a day, holding 3 seconds and repeating 5 times when permitted by her physician.            PT Education - 09/09/20 1157     Education Details Lymphedema risk  reduction and post op shoulder ROM HEP    Person(s) Educated Patient;Child(ren)    Methods Explanation;Demonstration;Handout    Comprehension Returned demonstration;Verbalized understanding                 PT Long Term Goals - 09/09/20 1201       PT LONG TERM GOAL #1   Title Patient will demonstrate she has regained full shoulder ROM and function post operatively compared to baselines.    Time 8    Period Weeks    Status New    Target Date 11/04/20             Breast Clinic Goals - 09/09/20 1201       Patient will be able to verbalize understanding of pertinent lymphedema risk reduction practices relevant to her diagnosis specifically related to skin care.   Time 1    Period Days    Status Achieved      Patient will be able to return demonstrate and/or verbalize understanding of the post-op home exercise program related to regaining shoulder range of motion.   Time 1    Period Days    Status Achieved      Patient will be able to verbalize understanding of the importance of attending the postoperative After Breast Cancer Class for further lymphedema risk  reduction education and therapeutic exercise.   Time 1    Period Days    Status Achieved                   Plan - 09/09/20 1159     Clinical Impression Statement Patient was diagnosed on 08/28/2020 with left grade I-II invasive ductal carcinoma breast cancer. It measures 1.1 cm and is located in the lower outer quadrant. It is ER/PR positive and HER2 negative with a Ki67 of 15%. Her multidisciplinary medical team met prior to her assessments to determine a recommended treatment plan. She is planning to have a left lumpectomy and sentinel node biopsy followed by Oncotype testing, radiation, and anti-estrogen therapy. She will benefit from a post op PT reassessment to determine needs and from L-Dex screens every 3 months for 2 years after surgery to detect subclinical needs.    Stability/Clinical Decision  Making Stable/Uncomplicated    Clinical Decision Making Low    Rehab Potential Excellent    PT Frequency --   Eval and 1 f/u visit   PT Treatment/Interventions ADLs/Self Care Home Management;Therapeutic exercise;Patient/family education    PT Next Visit Plan Will reassess 3-4 weeks post op to determine needs    PT Home Exercise Plan Post op HEP    Consulted and Agree with Plan of Care Patient;Family member/caregiver    Family Member Consulted daughter             Patient will benefit from skilled therapeutic intervention in order to improve the following deficits and impairments:  Postural dysfunction, Decreased range of motion, Impaired UE functional use, Pain, Decreased knowledge of precautions  Visit Diagnosis: Malignant neoplasm of lower-outer quadrant of left breast of female, estrogen receptor positive ([place]) - Plan: PT plan of care cert/re-cert  Abnormal posture - Plan: PT plan of care cert/re-cert  Patient will follow up at outpatient cancer rehab 3-4 weeks following surgery.  If the patient requires physical therapy at that time, a specific plan will be dictated and sent to the referring physician for approval. The patient was educated today on appropriate basic range of motion exercises to begin post operatively and the importance of attending the After Breast Cancer class following surgery.  Patient was educated today on lymphedema risk reduction practices as it pertains to recommendations that will benefit the patient immediately following surgery.  She verbalized good understanding.      Problem List Patient Active Problem List   Diagnosis Date Noted   Blindness right eye category 3, blindness left eye category 3 09/09/2020   Malignant neoplasm of lower-outer quadrant of left breast of female, estrogen receptor positive ([place]) 09/07/2020   Varicose veins of both lower extremities with pain 53/97/6734   Umbilical hernia without obstruction and without gangrene 07/08/2020    History of lacunar cerebrovascular accident 09/27/2019   Serrated adenoma of colon 09/27/2019   Bilateral primary osteoarthritis of knee 06/05/2017   Diverticulosis    History of vaginal dysplasia 08/31/2016   Mixed hyperlipidemia 06/02/2016   Sensorineural hearing loss (SNHL), bilateral 07/07/2015   Tinnitus of both ears 07/07/2015   Rectocele 05/30/2014   Essential hypertension 07/31/2013   Annia Friendly, PT 09/09/20 12:04 PM   [place], [place], [address] Phone: [phone]   Fax:  [phone]  Name: Delorese Sellin MRN: [id number] Date of Birth: Apr 27, 1950

## 2020-09-09 NOTE — Progress Notes (Signed)
[place] Work  Initial Assessment   Debra Simon is a 70 y.o. year old female accompanied by daughter, [name], and interpreterArbie Cookey. Clinical Social Work was referred by [facility] for assessment of psychosocial needs.   SDOH (Social Determinants of Health) assessments performed: Yes SDOH Interventions    Flowsheet Row Most Recent Value  SDOH Interventions   Food Insecurity Interventions Intervention Not Indicated  Financial Strain Interventions Intervention Not Indicated  Housing Interventions Intervention Not Indicated  Transportation Interventions Intervention Not Indicated       Distress Screen completed: Yes ONCBCN DISTRESS SCREENING 09/09/2020  Screening Type Initial Screening  Distress experienced in past week (1-10) 5  Emotional problem type Nervousness/Anxiety  Spiritual/Religous concerns type Relating to God;Loss of sense of purpose  Information Concerns Type Lack of info about complementary therapy choices  Physical Problem type Sleep/insomnia;Constipation/diarrhea;Tingling hands/feet      Family/Social Information:  Housing Arrangement: patient lives with husband, Bland Span . 2 children live in [place], 2 ([place], [place]) live in [place] Family members/support persons in your life? Family Transportation concerns: not currently but may need assistance if family cannot help drive her  Employment: Retired. Income source: Paediatric nurse concerns: No Type of concern: None Food access concerns: no Religious or spiritual practice: yes, [employer] Services Currently in place:  n/a  Coping/ Adjustment to diagnosis: Patient understands treatment plan and what happens next? yes, feels better after learning more information from her medical team Concerns about diagnosis and/or treatment:  General adjustment to diagnosis Patient reported stressors: Relating to God Hopes and priorities: to be treated and able to continue spending time with her  family and in her garden Patient enjoys gardening, sewing/ knitting, and time with family/ friends Current coping skills/ strengths: Capable of independent living, Motivation for treatment/growth, Special hobby/interest, and Supportive family/friends    SUMMARY: Current SDOH Barriers:  No significant SDOH concerns today. Potential transportation need if family is unable to assist for an appointment  Clinical Social Work Clinical Goal(s):  Patient will contact CSW at least 2-3 days in advance if she needs assistance with transportation  Interventions: Discussed common feeling and emotions when being diagnosed with cancer, and the importance of support during treatment Informed patient of the support team roles and support services at [facility] Provided [place] contact information and encouraged patient to call with any questions or concerns   Follow Up Plan: Patient will contact CSW with any support or resource needs Patient verbalizes understanding of plan: Yes    Christeen Douglas , LCSW

## 2020-09-10 ENCOUNTER — Other Ambulatory Visit: Payer: Self-pay | Admitting: General Surgery

## 2020-09-10 ENCOUNTER — Encounter: Payer: Self-pay | Admitting: Genetic Counselor

## 2020-09-10 DIAGNOSIS — Z808 Family history of malignant neoplasm of other organs or systems: Secondary | ICD-10-CM | POA: Insufficient documentation

## 2020-09-10 DIAGNOSIS — Z8049 Family history of malignant neoplasm of other genital organs: Secondary | ICD-10-CM | POA: Insufficient documentation

## 2020-09-10 DIAGNOSIS — Z803 Family history of malignant neoplasm of breast: Secondary | ICD-10-CM | POA: Insufficient documentation

## 2020-09-10 DIAGNOSIS — Z170 Estrogen receptor positive status [ER+]: Secondary | ICD-10-CM

## 2020-09-10 DIAGNOSIS — Z800 Family history of malignant neoplasm of digestive organs: Secondary | ICD-10-CM | POA: Insufficient documentation

## 2020-09-10 NOTE — Progress Notes (Signed)
REFERRING PROVIDER: Chauncey Cruel, MD [address] [place],  [place] [address]  PRIMARY PROVIDER:  Leamon Arnt, MD  PRIMARY REASON FOR VISIT:  1. Malignant neoplasm of lower-outer quadrant of left breast of female, estrogen receptor positive ([place])   2. Family history of breast cancer   3. Family history of brain cancer   4. Family history of pancreatic cancer   5. Family history of uterine cancer      HISTORY OF PRESENT ILLNESS:   Debra Simon, a 70 y.o. female, was seen for a [facility] cancer genetics consultation at the request of Dr. Jana Hakim due to a personal and family history of cancer.  Debra Simon presents to clinic today to discuss the possibility of a hereditary predisposition to cancer, genetic testing, and to further clarify her future cancer risks, as well as potential cancer risks for family members.   In June of 2022, at the age of 87, Debra Simon was diagnosed with invasive ductal carcinoma and ductal carcinoma in situ of the left breast. The tumor is ER+/PR+/Her2-. The treatment plan includes surgery, oncotype, radiation therapy, and antiestrogen therapy.    CANCER HISTORY:  Oncology History  Malignant neoplasm of lower-outer quadrant of left breast of female, estrogen receptor positive ([place])  09/07/2020 Initial Diagnosis   Malignant neoplasm of lower-outer quadrant of left breast of female, estrogen receptor positive ([place])    09/08/2020 Cancer Staging   Staging form: Breast, AJCC 8th Edition - Clinical: Stage IA (cT1c, cN0, cM0, G2, ER+, PR+, HER2-) - Signed by Chauncey Cruel, MD on 09/08/2020  Histologic grading system: 3 grade system       RISK FACTORS:  Menarche was at age 18.  First live birth at age 35.  OCP use for approximately 0 years.  Ovaries intact: no.  Hysterectomy: yes.  Menopausal status: postmenopausal.  HRT use: 0 years. Colonoscopy: yes;  2021 . Mammogram within the last year: yes.   Past Medical History:  Diagnosis  Date   Anxiety    Arthritis    Breast cancer ([place])    Colon polyps 7544   Diastolic dysfunction, left ventricle 06/26/2016   (ECHO 2018): Left ventricle: The cavity size was normal. Systolic function was normal. The estimated ejection fraction was in the range of 60% to 65%. Wall motion was normal; there were no regional wall motion abnormalities. Doppler parameters are consistent with abnormal left ventricular relaxation (grade 1 diastolic dysfunction).   Diverticulosis    Family history of brain cancer    Family history of breast cancer    Family history of pancreatic cancer    Family history of uterine cancer    GERD (gastroesophageal reflux disease)    watches what she eats   Heart murmur    dx last yr.   had echo 05/2016   Hyperlipidemia    Hypertension    Mild mitral regurgitation 07/04/2016   Primary localized osteoarthritis of right knee 05/24/2017   Ureterovaginal fistula    Uterine fibroid 1994   Vaginal dysplasia     Past Surgical History:  Procedure Laterality Date   BREAST CYST ASPIRATION Right    BREAST EXCISIONAL BIOPSY Right    COLONOSCOPY  2010   COLONOSCOPY  2012   COLONOSCOPY  2016   CYSTOSCOPY     ESOPHAGOGASTRODUODENOSCOPY  2010   TOTAL ABDOMINAL HYSTERECTOMY  1994   TOTAL KNEE ARTHROPLASTY Right 06/05/2017   TOTAL KNEE ARTHROPLASTY Right 06/05/2017   Procedure: RIGHT TOTAL KNEE ARTHROPLASTY;  Surgeon: Elsie Saas, MD;  Location: [facility];  Service: Orthopedics;  Laterality: Right;   Ueterovaginal fistual repair     URETERAL EXPLORATION  04/16/1992   URETEROTOMY ON THE RIGHT, REPAIR LACERATION ON THE LEFT    Social History   Socioeconomic History   Marital status: Married    Spouse name: Not on file   Number of children: Not on file   Years of education: Not on file   Highest education level: GED or equivalent  Occupational History   Occupation: Housewife  Tobacco Use   Smoking status: Never   Smokeless tobacco: Never  Vaping Use   Vaping  Use: Never used  Substance and Sexual Activity   Alcohol use: No    Alcohol/week: 0.0 standard drinks    Comment: rare on holidays   Drug use: No   Sexual activity: Never  Other Topics Concern   Not on file  Social History Narrative   Not on file   Social Determinants of Health   Financial Resource Strain: Low Risk    Difficulty of Paying Living Expenses: Not hard at all  Food Insecurity: No Food Insecurity   Worried About Charity fundraiser in the Last Year: Never true   [place] in the Last Year: Never true  Transportation Needs: No Transportation Needs   Lack of Transportation (Medical): No   Lack of Transportation (Non-Medical): No  Physical Activity: Inactive   Days of Exercise per Week: 0 days   Minutes of Exercise per Session: 0 min  Stress: No Stress Concern Present   Feeling of Stress : Not at all  Social Connections: Moderately Integrated   Frequency of Communication with Friends and Family: More than three times a week   Frequency of Social Gatherings with Friends and Family: Twice a week   Attends Religious Services: More than 4 times per year   Active Member of [employer] or Organizations: No   Attends Archivist Meetings: Never   Marital Status: Married     FAMILY HISTORY:  We obtained a detailed, 4-generation family history.  Significant diagnoses are listed below: Family History  Problem Relation Age of Onset   Diabetes Mother    Hypertension Mother    Brain cancer Father        d. 67   Hypertension Sister    Diabetes Sister    Diabetes Brother    Breast cancer Paternal Aunt        dx >50   Uterine cancer Paternal Aunt        dx >50   Cancer Maternal Grandmother        unknown type, died in her 55s   Pancreatic cancer Cousin 39       maternal first cousin   Uterine cancer Cousin        dx 73s, maternal first cousin   Breast cancer Cousin 46       paternal first cousin   CAD Neg Hx    Stroke Neg Hx    Colon cancer Neg Hx     Colon polyps Neg Hx    Esophageal cancer Neg Hx    Stomach cancer Neg Hx    Rectal cancer Neg Hx    Debra Simon has one daughter (age 32) and three sons (ages 29-44). She has two brothers and two sisters, all in their 36s. None of these relatives have had cancer.  Debra Simon mother is alive at age 80 without cancer. There  were three maternal aunts and two maternal uncles. One cousin died from uterine cancer in her 53s. Another cousin died from pancreatic cancer at age 51. Debra Simon maternal grandmother died in her 59s with an unknown type of cancer. Her maternal grandfather died in his 33s without cancer.  Debra Simon father died at age 20 with brain cancer. There were four paternal aunts and four paternal uncles. One aunt had uterine cancer older than 16. Another aunt had breast cancer older than 110. One cousin had breast cancer at age 1. Debra Simon paternal grandmother died at age 33 without cancer. She does not have information about her paternal grandfather.   Debra Simon is unaware of previous family history of genetic testing for hereditary cancer risks. Patient's maternal ancestors are of [place] descent, and paternal ancestors are of [place] descent. There is no reported Ashkenazi Jewish ancestry. There is no known consanguinity.  GENETIC COUNSELING ASSESSMENT: Debra Simon is a 70 y.o. female with a personal history of breast cancer and a family history of breast cancer, brain cancer, uterine cancer, and pancreatic cancer, which is somewhat suggestive of a hereditary cancer syndrome and predisposition to cancer. We, therefore, discussed and recommended the following at today's visit.   DISCUSSION: We discussed that approximately 5-10% of breast cancer is hereditary, with most cases associated with the BRCA1 and BRCA2 genes. There are other genes that can be associated with hereditary breast cancer syndromes. These include ATM, CHEK2, PALB2, etc. We discussed that testing is beneficial for several  reasons, including knowing about other cancer risks, identifying potential screening and risk-reduction options that may be appropriate, and to understand if other family members could be at risk for cancer and allow them to undergo genetic testing.  We reviewed the characteristics, features and inheritance patterns of hereditary cancer syndromes. We also discussed genetic testing, including the appropriate family members to test, the process of testing, insurance coverage and turn-around-time for results. We discussed the implications of a negative, positive and/or variant of uncertain significant result. In order to get genetic test results in a timely manner so that Debra Simon can use these genetic test results for surgical decisions, we recommended Debra Simon pursue genetic testing for the [facility]. Once complete, we recommend Debra Simon pursue reflex genetic testing to the CancerNext-Expanded + RNAinsight gene panel.   The BRCAplus panel offered by [employer] and includes sequencing and deletion/duplication analysis for the following 8 genes: ATM, BRCA1, BRCA2, CDH1, CHEK2, PALB2, PTEN, and TP53. The CancerNext-Expanded + RNAinsight gene panel offered by [employer] and includes sequencing and rearrangement analysis for the following 77 genes: AIP, ALK, APC, ATM, AXIN2, BAP1, BARD1, BLM, BMPR1A, BRCA1, BRCA2, BRIP1, CDC73, CDH1, CDK4, CDKN1B, CDKN2A, CHEK2, CTNNA1, DICER1, FANCC, FH, FLCN, GALNT12, KIF1B, LZTR1, MAX, MEN1, MET, MLH1, MSH2, MSH3, MSH6, MUTYH, NBN, NF1, NF2, NTHL1, PALB2, PHOX2B, PMS2, POT1, PRKAR1A, PTCH1, PTEN, RAD51C, RAD51D, RB1, RECQL, RET, SDHA, SDHAF2, SDHB, SDHC, SDHD, SMAD4, SMARCA4, SMARCB1, SMARCE1, STK11, SUFU, TMEM127, TP53, TSC1, TSC2, VHL and XRCC2 (sequencing and deletion/duplication); EGFR, EGLN1, HOXB13, KIT, MITF, PDGFRA, POLD1 and POLE (sequencing only); EPCAM and GREM1 (deletion/duplication only). RNA data is routinely analyzed for use in variant  interpretation for all genes.  Based on Debra Simon personal and family history of cancer, she meets medical criteria for genetic testing. Despite that she meets criteria, she may still have an out of pocket cost.    PLAN: After considering the risks, benefits, and limitations, Debra Simon provided informed consent to  pursue genetic testing and the blood sample was sent to [facility] for analysis of the BRCAplus + CancerNext-Expanded + RNAinsight panel. Results should be available within approximately one-two weeks' time, at which point they will be disclosed by telephone to Debra Simon, as will any additional recommendations warranted by these results. Debra Simon will receive a summary of her genetic counseling visit and a copy of her results once available. This information will also be available in Epic.   Debra Simon questions were answered to her satisfaction today. Our contact information was provided should additional questions or concerns arise. Thank you for the referral and allowing [place] to share in the care of your patient.   Clint Guy, [place], [facility] Licensed, Certified Dispensing [email or web address] Phone: [phone]  The patient was seen for a total of 25 minutes in face-to-face genetic counseling. Patient was seen with her daughter, Candace Gallus. This patient was discussed with Drs. Magrinat, Lindi Adie and/or Burr Medico who agrees with the above.    _______________________________________________________________________ For Office Staff:  Number of people involved in session: 1 Was an Intern/ student involved with case: no

## 2020-09-14 ENCOUNTER — Encounter: Payer: Self-pay | Admitting: *Deleted

## 2020-09-14 ENCOUNTER — Encounter: Payer: Self-pay | Admitting: Anesthesiology

## 2020-09-15 ENCOUNTER — Telehealth: Payer: Self-pay | Admitting: Oncology

## 2020-09-15 NOTE — Telephone Encounter (Signed)
Sch per 6/17 sch msg, pt aware

## 2020-09-16 ENCOUNTER — Encounter: Payer: Self-pay | Admitting: Genetic Counselor

## 2020-09-16 ENCOUNTER — Telehealth: Payer: Self-pay | Admitting: Genetic Counselor

## 2020-09-16 DIAGNOSIS — Z1379 Encounter for other screening for genetic and chromosomal anomalies: Secondary | ICD-10-CM | POA: Insufficient documentation

## 2020-09-16 NOTE — Telephone Encounter (Signed)
Revealed negative genetic testing through the [facility] panel. Discussed that we do not know why she has breast cancer or why there is cancer in the family. There could be a genetic mutation in the family that Debra Simon did not inherit. There could also be a mutation in a different gene that we are not testing, or our current technology may not be able to detect certain mutations. It will therefore be important for her to stay in contact with genetics to keep up with whether additional testing may be appropriate in the future.   Additional testing through the [facility] CancerNext-Expanded + RNAinsight panel is pending. We will call her once these results are available.    Phone call completed with assistance of Spanish interpreter.

## 2020-09-21 ENCOUNTER — Ambulatory Visit: Payer: Self-pay | Admitting: Genetic Counselor

## 2020-09-21 ENCOUNTER — Encounter: Payer: Self-pay | Admitting: Genetic Counselor

## 2020-09-21 DIAGNOSIS — Z1379 Encounter for other screening for genetic and chromosomal anomalies: Secondary | ICD-10-CM

## 2020-09-21 NOTE — Telephone Encounter (Signed)
Revealed negative genetic testing through the [facility] CancerNext-Expanded + RNAinsight panel.    Phone call completed with assistance of Spanish interpreter.

## 2020-09-21 NOTE — Progress Notes (Signed)
HPI:  Debra Simon was previously seen in the [facility] clinic due to a personal and family history of cancer and concerns regarding a hereditary predisposition to cancer. Please refer to our prior cancer genetics clinic note for more information regarding our discussion, assessment and recommendations, at the time. Debra Simon recent genetic test results were disclosed to her, as were recommendations warranted by these results. These results and recommendations are discussed in more detail below.  CANCER HISTORY:  Oncology History  Malignant neoplasm of lower-outer quadrant of left breast of female, estrogen receptor positive ([place])  09/07/2020 Initial Diagnosis   Malignant neoplasm of lower-outer quadrant of left breast of female, estrogen receptor positive ([place])    09/08/2020 Cancer Staging   Staging form: Breast, AJCC 8th Edition - Clinical: Stage IA (cT1c, cN0, cM0, G2, ER+, PR+, HER2-) - Signed by Chauncey Cruel, MD on 09/08/2020  Histologic grading system: 3 grade system    09/15/2020 Genetic Testing   Negative genetic testing:  No pathogenic variants detected on the [facility] BRCAplus panel (report date 09/15/2020) or the CancerNext-Expanded + RNAinsight panel (report date 09/19/2020).    The BRCAplus panel offered by [employer] and includes sequencing and deletion/duplication analysis for the following 8 genes: ATM, BRCA1, BRCA2, CDH1, CHEK2, PALB2, PTEN, and TP53. The CancerNext-Expanded + RNAinsight gene panel offered by [employer] and includes sequencing and rearrangement analysis for the following 77 genes: AIP, ALK, APC, ATM, AXIN2, BAP1, BARD1, BLM, BMPR1A, BRCA1, BRCA2, BRIP1, CDC73, CDH1, CDK4, CDKN1B, CDKN2A, CHEK2, CTNNA1, DICER1, FANCC, FH, FLCN, GALNT12, KIF1B, LZTR1, MAX, MEN1, MET, MLH1, MSH2, MSH3, MSH6, MUTYH, NBN, NF1, NF2, NTHL1, PALB2, PHOX2B, PMS2, POT1, PRKAR1A, PTCH1, PTEN, RAD51C, RAD51D, RB1, RECQL, RET, SDHA, SDHAF2, SDHB, SDHC, SDHD, SMAD4, SMARCA4,  SMARCB1, SMARCE1, STK11, SUFU, TMEM127, TP53, TSC1, TSC2, VHL and XRCC2 (sequencing and deletion/duplication); EGFR, EGLN1, HOXB13, KIT, MITF, PDGFRA, POLD1 and POLE (sequencing only); EPCAM and GREM1 (deletion/duplication only). RNA data is routinely analyzed for use in variant interpretation for all genes.     FAMILY HISTORY:  We obtained a detailed, 4-generation family history.  Significant diagnoses are listed below: Family History  Problem Relation Age of Onset   Diabetes Mother    Hypertension Mother    Brain cancer Father        d. 70   Hypertension Sister    Diabetes Sister    Diabetes Brother    Breast cancer Paternal Aunt        dx >50   Uterine cancer Paternal Aunt        dx >50   Cancer Maternal Grandmother        unknown type, died in her 19s   Pancreatic cancer Cousin 51       maternal first cousin   Uterine cancer Cousin        dx 62s, maternal first cousin   Breast cancer Cousin 61       paternal first cousin   CAD Neg Hx    Stroke Neg Hx    Colon cancer Neg Hx    Colon polyps Neg Hx    Esophageal cancer Neg Hx    Stomach cancer Neg Hx    Rectal cancer Neg Hx     Debra Simon has one daughter (age 44) and three sons (ages 67-44). She has two brothers and two sisters, all in their 68s. None of these relatives have had cancer.   Debra Simon mother is alive at age 56 without cancer.  There were three maternal aunts and two maternal uncles. One cousin died from uterine cancer in her 85s. Another cousin died from pancreatic cancer at age 22. Debra Simon maternal grandmother died in her 45s with an unknown type of cancer. Her maternal grandfather died in his 65s without cancer.   Debra Simon father died at age 41 with brain cancer. There were four paternal aunts and four paternal uncles. One aunt had uterine cancer older than 19. Another aunt had breast cancer older than 41. One cousin had breast cancer at age 91. Debra Simon paternal grandmother died at age 65 without  cancer. She does not have information about her paternal grandfather.   Debra Simon is unaware of previous family history of genetic testing for hereditary cancer risks. Patient's maternal ancestors are of [place] descent, and paternal ancestors are of [place] descent. There is no reported Ashkenazi Jewish ancestry. There is no known consanguinity.  GENETIC TEST RESULTS: Genetic testing reported out on 09/15/2020 through the [facility] panel, and on 09/19/2020 through the [facility] + RNAinsight panel. No pathogenic variants were detected.   The BRCAplus panel offered by [employer] and includes sequencing and deletion/duplication analysis for the following 8 genes: ATM, BRCA1, BRCA2, CDH1, CHEK2, PALB2, PTEN, and TP53. The CancerNext-Expanded + RNAinsight gene panel offered by [employer] and includes sequencing and rearrangement analysis for the following 77 genes: AIP, ALK, APC, ATM, AXIN2, BAP1, BARD1, BLM, BMPR1A, BRCA1, BRCA2, BRIP1, CDC73, CDH1, CDK4, CDKN1B, CDKN2A, CHEK2, CTNNA1, DICER1, FANCC, FH, FLCN, GALNT12, KIF1B, LZTR1, MAX, MEN1, MET, MLH1, MSH2, MSH3, MSH6, MUTYH, NBN, NF1, NF2, NTHL1, PALB2, PHOX2B, PMS2, POT1, PRKAR1A, PTCH1, PTEN, RAD51C, RAD51D, RB1, RECQL, RET, SDHA, SDHAF2, SDHB, SDHC, SDHD, SMAD4, SMARCA4, SMARCB1, SMARCE1, STK11, SUFU, TMEM127, TP53, TSC1, TSC2, VHL and XRCC2 (sequencing and deletion/duplication); EGFR, EGLN1, HOXB13, KIT, MITF, PDGFRA, POLD1 and POLE (sequencing only); EPCAM and GREM1 (deletion/duplication only). RNA data is routinely analyzed for use in variant interpretation for all genes. The test report will be scanned into EPIC and located under the Molecular Pathology section of the Results Review tab.  A portion of the result report is included below for reference.     We discussed with Debra Simon that because current genetic testing is not perfect, it is possible there may be a gene mutation in one of these genes that current testing  cannot detect, but that chance is small.  We also discussed that there could be another gene that has not yet been discovered, or that we have not yet tested, that is responsible for the cancer diagnoses in the family. It is also possible there is a hereditary cause for the cancer in the family that Debra Simon did not inherit and therefore was not identified in her testing.  Therefore, it is important to remain in touch with cancer genetics in the future so that we can continue to offer Debra Simon the most up to date genetic testing.   CANCER SCREENING RECOMMENDATIONS: Debra Simon test result is considered negative (normal).  This means that we have not identified a hereditary cause for her personal and family history of cancer at this time. While reassuring, this does not definitively rule out a hereditary predisposition to cancer. It is still possible that there could be genetic mutations that are undetectable by current technology. There could be genetic mutations in genes that have not been tested or identified to increase cancer risk.  Therefore, it is recommended she continue to follow the cancer management and  screening guidelines provided by her oncology and primary healthcare provider.   An individual's cancer risk and medical management are not determined by genetic test results alone. Overall cancer risk assessment incorporates additional factors, including personal medical history, family history, and any available genetic information that may result in a personalized plan for cancer prevention and surveillance.  RECOMMENDATIONS FOR FAMILY MEMBERS:  Individuals in this family might be at some increased risk of developing cancer, over the general population risk, simply due to the family history of cancer.  We recommended women in this family have a yearly mammogram beginning at age 26, or 24 years younger than the earliest onset of cancer, an annual clinical breast exam, and perform monthly breast  self-exams. Women in this family should also have a gynecological exam as recommended by their primary provider. All family members should be referred for colonoscopy starting at age 63.  FOLLOW-UP: Lastly, we discussed with Debra Simon that cancer genetics is a rapidly advancing field and it is possible that new genetic tests will be appropriate for her and/or her family members in the future. We encouraged her to remain in contact with cancer genetics on an annual basis so we can update her personal and family histories and let her know of advances in cancer genetics that may benefit this family.   Our contact number was provided. Debra Simon questions were answered to her satisfaction, and she knows she is welcome to call us at anytime with additional questions or concerns.   Clint Guy, MS, [facility] Genetic Counselor [email or web address] Phone: [phone]

## 2020-09-22 ENCOUNTER — Other Ambulatory Visit: Payer: Self-pay | Admitting: General Surgery

## 2020-09-29 ENCOUNTER — Encounter: Payer: Self-pay | Admitting: *Deleted

## 2020-09-29 ENCOUNTER — Other Ambulatory Visit: Payer: Self-pay | Admitting: General Surgery

## 2020-09-29 DIAGNOSIS — C50512 Malignant neoplasm of lower-outer quadrant of left female breast: Secondary | ICD-10-CM

## 2020-10-07 ENCOUNTER — Inpatient Hospital Stay (HOSPITAL_COMMUNITY): Admission: RE | Admit: 2020-10-07 | Payer: Medicare HMO | Source: Ambulatory Visit

## 2020-10-09 NOTE — Progress Notes (Signed)
Surgical Instructions    Your procedure is scheduled on Thursday, July 21st, 2022.  Report to [facility] Main Entrance "A" at 09:00 A.M., then check in with the Admitting office.  Call this number if you have problems the morning of surgery:  [phone]   If you have any questions prior to your surgery date call [phone]: Open Monday-Friday 8am-4pm    Remember:  Do not eat after midnight the night before your surgery  You may drink clear liquids until 08:00 the morning of your surgery.   Clear liquids allowed are: Water, Non-Citrus Juices (without pulp), Carbonated Beverages, Clear Tea, Black Coffee Only, and Gatorade   Patient Instructions  The night before surgery:  No food after midnight. ONLY clear liquids after midnight  The day of surgery (if you do NOT have diabetes):  Drink ONE (1) Pre-Surgery Clear Ensure by 08:00 the morning of surgery. Drink in one sitting. Do not sip.  This drink was given to you during your hospital  pre-op appointment visit.  Nothing else to drink after completing the  Pre-Surgery Clear Ensure.   Follow your surgeon's instructions on when to stop Aspirin.  If no instructions were given by your surgeon then you will need to call the office to get those instructions.     As of today, STOP taking any Aspirin (unless otherwise instructed by your surgeon) Aleve, Naproxen, Ibuprofen, Motrin, Advil, Goody's, BC's, all herbal medications, fish oil, and all vitamins.          Do not wear jewelry or makeup Do not wear lotions, powders, perfumes, or deodorant. Do not shave 48 hours prior to surgery. Do not bring valuables to the hospital. DO Not wear nail polish, gel polish, artificial nails, or any other type of covering on natural nails including finger and toenails. If patients have artificial nails, gel coating, etc. that need to be removed by a nail salon please have this removed prior to surgery or surgery may need to be canceled/delayed if the  surgeon/ anesthesia feels like the patient is unable to be adequately monitored.             [facility] is not responsible for any belongings or valuables.  Do NOT Smoke (Tobacco/Vaping) or drink Alcohol 24 hours prior to your procedure If you use a CPAP at night, you may bring all equipment for your overnight stay.   Contacts, glasses, dentures or bridgework may not be worn into surgery, please bring cases for these belongings   For patients admitted to the hospital, discharge time will be determined by your treatment team.   Patients discharged the day of surgery will not be allowed to drive home, and someone needs to stay with them for 24 hours.  ONLY 1 SUPPORT PERSON MAY BE PRESENT WHILE YOU ARE IN SURGERY. IF YOU ARE TO BE ADMITTED ONCE YOU ARE IN YOUR ROOM YOU WILL BE ALLOWED TWO (2) VISITORS.  Minor children may have two parents present. Special consideration for safety and communication needs will be reviewed on a case by case basis.  Special instructions:    Oral Hygiene is also important to reduce your risk of infection.  Remember - BRUSH YOUR TEETH THE MORNING OF SURGERY WITH YOUR REGULAR TOOTHPASTE   [place] [facility]- Preparing For Surgery  Before surgery, you can play an important role. Because skin is not sterile, your skin needs to be as free of germs as possible. You can reduce the number of germs on your skin by  washing with CHG (chlorahexidine gluconate) Soap before surgery.  CHG is an antiseptic cleaner which kills germs and bonds with the skin to continue killing germs even after washing.     Please do not use if you have an allergy to CHG or antibacterial soaps. If your skin becomes reddened/irritated stop using the CHG.  Do not shave (including legs and underarms) for at least 48 hours prior to first CHG shower. It is OK to shave your face.  Please follow these instructions carefully.     Shower the NIGHT BEFORE SURGERY and the MORNING OF SURGERY with CHG Soap.   If  you chose to wash your hair, wash your hair first as usual with your normal shampoo. After you shampoo, rinse your hair and body thoroughly to remove the shampoo.  Then [employer] and genitals (private parts) with your normal soap and rinse thoroughly to remove soap.  After that Use CHG Soap as you would any other liquid soap. You can apply CHG directly to the skin and wash gently with a scrungie or a clean washcloth.   Apply the CHG Soap to your body ONLY FROM THE NECK DOWN.  Do not use on open wounds or open sores. Avoid contact with your eyes, ears, mouth and genitals (private parts). Wash Face and genitals (private parts)  with your normal soap.   Wash thoroughly, paying special attention to the area where your surgery will be performed.  Thoroughly rinse your body with warm water from the neck down.  DO NOT shower/wash with your normal soap after using and rinsing off the CHG Soap.  Pat yourself dry with a CLEAN TOWEL.  Wear CLEAN PAJAMAS to bed the night before surgery  Place CLEAN SHEETS on your bed the night before your surgery  DO NOT SLEEP WITH PETS.   Day of Surgery:  Take a shower with CHG soap. Wear Clean/Comfortable clothing the morning of surgery Do not apply any deodorants/lotions.   Remember to brush your teeth WITH YOUR REGULAR TOOTHPASTE.   Please read over the following fact sheets that you were given.

## 2020-10-12 ENCOUNTER — Encounter (HOSPITAL_COMMUNITY)
Admission: RE | Admit: 2020-10-12 | Discharge: 2020-10-12 | Disposition: A | Discharge status: Home / Self Care | Payer: Medicare HMO | Source: Ambulatory Visit | Attending: General Surgery | Admitting: General Surgery

## 2020-10-12 ENCOUNTER — Encounter (HOSPITAL_COMMUNITY): Payer: Self-pay

## 2020-10-12 ENCOUNTER — Other Ambulatory Visit: Payer: Self-pay

## 2020-10-12 DIAGNOSIS — Z7901 Long term (current) use of anticoagulants: Secondary | ICD-10-CM | POA: Insufficient documentation

## 2020-10-12 DIAGNOSIS — C50912 Malignant neoplasm of unspecified site of left female breast: Secondary | ICD-10-CM | POA: Diagnosis not present

## 2020-10-12 DIAGNOSIS — I10 Essential (primary) hypertension: Secondary | ICD-10-CM | POA: Diagnosis not present

## 2020-10-12 DIAGNOSIS — Z6832 Body mass index (BMI) 32.0-32.9, adult: Secondary | ICD-10-CM | POA: Insufficient documentation

## 2020-10-12 DIAGNOSIS — E669 Obesity, unspecified: Secondary | ICD-10-CM | POA: Insufficient documentation

## 2020-10-12 DIAGNOSIS — Z79899 Other long term (current) drug therapy: Secondary | ICD-10-CM | POA: Diagnosis not present

## 2020-10-12 DIAGNOSIS — R011 Cardiac murmur, unspecified: Secondary | ICD-10-CM | POA: Insufficient documentation

## 2020-10-12 DIAGNOSIS — Z7982 Long term (current) use of aspirin: Secondary | ICD-10-CM | POA: Insufficient documentation

## 2020-10-12 DIAGNOSIS — Z01818 Encounter for other preprocedural examination: Secondary | ICD-10-CM | POA: Insufficient documentation

## 2020-10-12 HISTORY — DX: Headache, unspecified: R51.9

## 2020-10-12 HISTORY — DX: Prediabetes: R73.03

## 2020-10-12 HISTORY — DX: Pneumonia, unspecified organism: J18.9

## 2020-10-12 LAB — HEMOGLOBIN A1C
Hgb A1c MFr Bld: 6 % — ABNORMAL HIGH (ref 4.8–5.6)
Mean Plasma Glucose: 125.5 mg/dL

## 2020-10-12 LAB — BASIC METABOLIC PANEL
Anion gap: 11 (ref 5–15)
BUN: 8 mg/dL (ref 8–23)
CO2: 31 mmol/L (ref 22–32)
Calcium: 9.3 mg/dL (ref 8.9–10.3)
Chloride: 93 mmol/L — ABNORMAL LOW (ref 98–111)
Creatinine, Ser: 0.57 mg/dL (ref 0.44–1.00)
GFR, Estimated: >60 mL/min (ref >60)
Glucose, Bld: 110 mg/dL — ABNORMAL HIGH (ref 70–99)
Potassium: 3 mmol/L — ABNORMAL LOW (ref 3.5–5.1)
Sodium: 135 mmol/L (ref 135–145)

## 2020-10-12 LAB — GLUCOSE, CAPILLARY: Glucose-Capillary: 116 mg/dL — ABNORMAL HIGH (ref 70–99)

## 2020-10-12 NOTE — Pre-Procedure Instructions (Signed)
Debra Simon  10/12/2020    Your procedure is scheduled on Thursday, October 15, 2020 at 11:00 AM.   Report to [facility] Entrance "A" Admitting Office at 9:00 AM.   Call this number if you have problems the morning of surgery: [phone]   Questions prior to day of surgery, please call [phone] between 8 & 4 PM.    Remember:  Do not eat food after midnight Wednesday, 10/14/20.            You may have clear liquids until 8:00 AM. Clear liquids include: Water, Clear juices (Apple or Cranberry), Carbonated beverages, Clear tea, Black coffee and Gatorade. Please drink the Ensure Pre-Surgery drink by 8:00 AM, this will be the last liquid you will have prior to surgery.   Take these medicines the morning of surgery with A SIP OF WATER: None  Stop Aspirin as instructed by surgeon or physician. Stop Vitamins as of day. Do not use other Aspirin containing products, NSAIDS (Ibuprofen, Aleve, etc), Herbal medications or Fish Oil prior to surgery.   Do not wear jewelry, make-up or nail polish.  Do not wear lotions, powders, perfumes or deodorant.  Do not shave 48 hours prior to surgery.    Do not bring valuables to the hospital.  [facility] is not responsible for any belongings or valuables.  Contacts, dentures or bridgework may not be worn into surgery.  Leave your suitcase in the car.  After surgery it may be brought to your room.  For patients admitted to the hospital, discharge time will be determined by your treatment team.  Patients discharged the day of surgery will not be allowed to drive home.   [facility] - Preparing for Surgery  Before surgery, you can play an important role.  Because skin is not sterile, your skin needs to be as free of germs as possible.  You can reduce the number of germs on you skin by washing with CHG (chlorahexidine gluconate) soap before surgery.  CHG is an antiseptic cleaner which kills germs and bonds with the skin to continue killing germs  even after washing.  Oral Hygiene is also important in reducing the risk of infection.  Remember to brush your teeth with your regular toothpaste the morning of surgery.  Please DO NOT use if you have an allergy to CHG or antibacterial soaps.  If your skin becomes reddened/irritated stop using the CHG and inform your nurse when you arrive at Short Stay.  Do not shave (including legs and underarms) for at least 48 hours prior to the first CHG shower.  You may shave your face.  Please follow these instructions carefully:   1.  Shower with CHG Soap the night before surgery and the morning of Surgery.  2.  If you choose to wash your hair, wash your hair first as usual with your normal shampoo.  3.  After you shampoo, rinse your hair and body thoroughly to remove the shampoo. 4.  Use CHG as you would any other liquid soap.  You can apply chg directly to the skin and wash gently with a      scrungie or washcloth.           5.  Apply the CHG Soap to your body ONLY FROM THE NECK DOWN.   Do not use on open wounds or open sores. Avoid contact with your eyes, ears, mouth and genitals (private parts).  Wash genitals (private parts) with your normal soap, do this  prior to using CHG soap.  6.  Wash thoroughly, paying special attention to the area where your surgery will be performed.  7.  Thoroughly rinse your body with warm water from the neck down.  8.  DO NOT shower/wash with your normal soap after using and rinsing off the CHG Soap.  9.  Pat yourself dry with a clean towel.            10.  Wear clean pajamas.            11.  Place clean sheets on your bed the night of your first shower and do not sleep with pets.  Day of Surgery  Shower as above. Do not apply any lotions/deodorants the morning of surgery.   Please wear clean clothes to the hospital. Remember to brush your teeth with toothpaste.  Please read over the fact sheets that you were given.

## 2020-10-12 NOTE — H&P (Signed)
Debra Simon Location: [facility] Patient #: [id number] DOB: [birth year] Undefined / Language: Undefined / Race: Refused to Report/Unreported Female   History of Present Illness  The patient is a 70 year old female who presents with breast cancer.Pt is a 70 yo F who presented with left breast cancer 08/2020. She had screening detected asymmetry.  Diagnostic imaging showed a 1.1 cm irregular mass at 6 o'clock in the retroareolar left breast.  Core needle biopsy was performed and this showed grade 1 invasive ductal carcinoma with DCIS, +/+/-. Ki 67 15%.    She has family history of cancer.  Her father had brain cancer.  Two paternal aunts had cancer with one breast and one uterine .  She has two maternal cousins with cancer, one pancreas and one uterine.    She had menarche at age 71. Menopause was at age 12 years ago.  She is a G4P4 with first child at age 32.  She is up to date with colonoscopy and bone density.  She has no history of tobacco, alcohol, or drug use.     dx mammogram/ultrasound. 08/28/20 ACR Breast Density Category c: The breast tissue is heterogeneously dense, which may obscure small masses.  FINDINGS: There is a persistent focal asymmetry with associated distortion in the deep central left breast. Further evaluation with ultrasound was performed.  Targeted ultrasound is performed, showing an irregular hypoechoic mass with associated architectural distortion of the surrounding tissue at the 6 o'clock retroareolar position. There are so seated circumscribed hypoechoic masses which may represent cysts. Overall, the irregular mass measures 11 x 10 x 7 mm. There is associated vascularity. This likely corresponds with the mammographic finding.  Evaluation of the left axilla demonstrates no suspicious lymphadenopathy.  IMPRESSION: 1. Suspicious left breast mass likely corresponding with the screening mammographic findings. Recommendation is  for ultrasound-guided biopsy. 2. No suspicious left axillary lymphadenopathy.  RECOMMENDATION: Ultrasound-guided biopsy of the left breast with close attention on post clip films to ensure mammographic correlation. If this does not correlate, additional stereotactic biopsy is recommended.  I have discussed the findings and recommendations with the patient. If applicable, a reminder letter will be sent to the patient regarding the next appointment.  BI-RADS CATEGORY  4: Suspicious.    pathology core needle biopsy 09/03/20 Diagnosis Breast, left, needle core biopsy, 6 o'clock 1 CMFN - INVASIVE DUCTAL CARCINOMA - DUCTAL CARCINOMA IN SITU - SEE COMMENT Microscopic Comment Based on the biopsy, the carcinoma appears Nottingham grade 1-2 of 3 and measures 0.9 cm in greatest linear extent. The tumor cells are NEGATIVE for Her2 (1+). Estrogen Receptor: >95%, POSITIVE, STRONG STAINING INTENSITY Progesterone Receptor: >95%, POSITIVE, STRONG STAINING INTENSITY Proliferation Marker Ki67: 15%  CBC and CMET normal 09/09/20   Past Surgical History  Breast Biopsy   Left. Hysterectomy (not due to cancer) - Partial   Knee Surgery   Right.  Diagnostic Studies History  Colonoscopy   within last year Mammogram   within last year Pap Smear   1-5 years ago  Medication History  Medications Reconciled   Social History Alcohol use   Occasional alcohol use. Caffeine use   Coffee. No drug use   Tobacco use   Never smoker.  Family History Breast Cancer   Sister. Cancer   Father. Diabetes Mellitus   Mother.  Pregnancy / Birth History Age at menarche   72 years. Gravida   4 Maternal age   57-25 Para   2 Regular periods    Other Problems  Back Pain   High blood pressure   Hypercholesterolemia      Review of Systems General Not Present- Appetite Loss, Chills, Fatigue, Fever, Night Sweats, Weight Gain and Weight Loss. Skin Not Present- Change in Wart/Mole, Dryness, Hives, Jaundice,  New Lesions, Non-Healing Wounds, Rash and Ulcer. HEENT Present- Wears glasses/contact lenses. Not Present- Earache, Hearing Loss, Hoarseness, Nose Bleed, Oral Ulcers, Ringing in the Ears, Seasonal Allergies, Sinus Pain, Sore Throat, Visual Disturbances and Yellow Eyes. Breast Not Present- Breast Mass, Breast Pain, Nipple Discharge and Skin Changes. Cardiovascular Not Present- Chest Pain, Difficulty Breathing Lying Down, Leg Cramps, Palpitations, Rapid Heart Rate, Shortness of Breath and Swelling of Extremities. Gastrointestinal Present- Constipation. Not Present- Abdominal Pain, Bloating, Bloody Stool, Change in Bowel Habits, Chronic diarrhea, Difficulty Swallowing, Excessive gas, Gets full quickly at meals, Hemorrhoids, Indigestion, Nausea, Rectal Pain and Vomiting. Female Genitourinary Present- Nocturia. Not Present- Frequency, Painful Urination, Pelvic Pain and Urgency. Musculoskeletal Present- Back Pain. Not Present- Joint Pain, Joint Stiffness, Muscle Pain, Muscle Weakness and Swelling of Extremities. Neurological Not Present- Decreased Memory, Fainting, Headaches, Numbness, Seizures, Tingling, Tremor, Trouble walking and Weakness. Psychiatric Present- Anxiety. Not Present- Bipolar, Change in Sleep Pattern, Depression, Fearful and Frequent crying. Endocrine Not Present- Cold Intolerance, Excessive Hunger, Hair Changes, Heat Intolerance, Hot flashes and New Diabetes. Hematology Not Present- Blood Thinners, Easy Bruising, Excessive bleeding, Gland problems, HIV and Persistent Infections.  Vitals  Weight: 171.4 lb   Height: 60 in  Body Surface Area: 1.75 m   Body Mass Index: 33.47 kg/m   Temp.: 97.4 F    Pulse: 75 (Regular)    Resp.: 18 (Unlabored)    BP: 148/70(Sitting, Left Arm, Standard)       Physical Exam  General Mental Status - Alert. General Appearance - Consistent with stated age. Hydration - Well hydrated. Voice - Normal.  Head and Neck Head - normocephalic, atraumatic  with no lesions or palpable masses. Trachea - midline. Thyroid Gland Characteristics - normal size and consistency.  Eye Eyeball - Bilateral - Extraocular movements intact. Sclera/Conjunctiva - Bilateral - No scleral icterus.  Chest and Lung Exam Chest and lung exam reveals  - quiet, even and easy respiratory effort with no use of accessory muscles and on auscultation, normal breath sounds, no adventitious sounds and normal vocal resonance. Inspection Chest Wall - Normal. Back - normal.  Breast Note:  bruising central left breast. slightly tender. no palpable masses. no nipple retraction or skin dimpling. no LAD. minimal ptosis.   Cardiovascular Cardiovascular examination reveals  - normal heart sounds, regular rate and rhythm with no murmurs and normal pedal pulses bilaterally.  Abdomen Inspection Inspection of the abdomen reveals - No Hernias. Palpation/Percussion Palpation and Percussion of the abdomen reveal - Soft, Non Tender, No Rebound tenderness, No Rigidity (guarding) and No hepatosplenomegaly. Auscultation Auscultation of the abdomen reveals - Bowel sounds normal.  Neurologic Neurologic evaluation reveals  - alert and oriented x 3 with no impairment of recent or remote memory. Mental Status - Normal.  Musculoskeletal Global Assessment  - Note:  no gross deformities.  Normal Exam - Left - Upper Extremity Strength Normal and Lower Extremity Strength Normal. Normal Exam - Right - Upper Extremity Strength Normal and Lower Extremity Strength Normal.  Lymphatic Head & Neck  General Head & Neck Lymphatics: Bilateral - Description - Normal. Axillary  General Axillary Region: Bilateral - Description - Normal. Tenderness - Non Tender. Femoral & Inguinal  Generalized Femoral & Inguinal Lymphatics: Bilateral - Description - No Generalized lymphadenopathy.  Assessment & Plan  MALIGNANT NEOPLASM OF LOWER-OUTER QUADRANT OF LEFT BREAST OF FEMALE, ESTROGEN RECEPTOR  POSITIVE (C50.512) Impression: Pt has a new diagnosis of left breast cancer. This is amenable to breast conservation. we will plan this after genetics comes back. As long as she doesn't have a genetic defect, we will schedule her for a seed localized lumpectomy with sentinel node biopsy. This would be followed by oncotype, radiation, and antiestrogen tx.  The surgical procedure was described to the patient. I discussed the incision type and location and that we would need radiology involved on with a wire or seed marker and/or sentinel node.  The risks and benefits of the procedure were described to the patient and she wishes to proceed.  We discussed the risks bleeding, infection, damage to other structures, need for further procedures/surgeries. We discussed the risk of seroma. The patient was advised if the area in the breast in cancer, we may need to go back to surgery for additional tissue to obtain negative margins or for a lymph node biopsy. The patient was advised that these are the most common complications, but that others can occur as well. They were advised against taking aspirin or other anti-inflammatory agents/blood thinners the week before surgery.  FAMILY HISTORY OF CANCER (Z80.9) Impression: Genetic testing NEGATIVE

## 2020-10-12 NOTE — Progress Notes (Addendum)
PCP - Dr. Billey Chang  EKG - today ECHO - 06/23/26  Pt is pre-diabetic, A1C done today  Aspirin Instructions: last dose was 10/05/20  ERAS Protcol - yes, pt is pre-diabetic, given 10 ounce bottle water to drink DOS  COVID TEST- not required  Anesthesia review: Yes, Potassium is 3.0  Patient denies shortness of breath, fever, cough and chest pain at PAT appointment   All instructions explained to the patient, with a verbal understanding of the material. Patient agrees to go over the instructions while at home for a better understanding. Patient also instructed to self quarantine after being tested for COVID-19. The opportunity to ask questions was provided.

## 2020-10-13 NOTE — Anesthesia Preprocedure Evaluation (Addendum)
Anesthesia Evaluation  Patient identified by MRN, date of birth, ID band Patient awake    Reviewed: Allergy & Precautions, NPO status , Patient's Chart, lab work & pertinent test results  Airway Mallampati: III  TM Distance: >3 FB Neck ROM: Full    Dental no notable dental hx. (+) Teeth Intact   Pulmonary pneumonia,    Pulmonary exam normal breath sounds clear to auscultation       Cardiovascular hypertension, Pt. on medications Normal cardiovascular exam+ Valvular Problems/Murmurs  Rhythm:Regular Rate:Normal  Echo 06/22/16: Study Conclusions  - Left ventricle: The cavity size was normal. Systolic function was  normal. The estimated ejection fraction was in the range of 60%  to 65%. Wall motion was normal; there were no regional wall  motion abnormalities. Doppler parameters are consistent with  abnormal left ventricular relaxation (grade 1 diastolic  dysfunction).  - Mitral valve: There was mild regurgitation.  - Left atrium: The atrium was normal in size.  - Right ventricle: Systolic function was normal.  - Pulmonary arteries: Systolic pressure was within the normal  range.     Neuro/Psych  Headaches, Anxiety negative psych ROS   GI/Hepatic Neg liver ROS, GERD  ,  Endo/Other  Morbid obesity  Renal/GU negative Renal ROS  negative genitourinary   Musculoskeletal  (+) Arthritis , Osteoarthritis,    Abdominal   Peds negative pediatric ROS (+)  Hematology negative hematology ROS (+)   Anesthesia Other Findings   Reproductive/Obstetrics negative OB ROS                            Anesthesia Physical Anesthesia Plan  ASA: 3  Anesthesia Plan: General   Post-op Pain Management: GA combined w/ Regional for post-op pain   Induction:   PONV Risk Score and Plan: 3 and Ondansetron, Dexamethasone and Midazolam  Airway Management Planned: Oral ETT and LMA  Additional  Equipment: None  Intra-op Plan:   Post-operative Plan: Extubation in OR  Informed Consent: I have reviewed the patients History and Physical, chart, labs and discussed the procedure including the risks, benefits and alternatives for the proposed anesthesia with the patient or authorized representative who has indicated his/her understanding and acceptance.     Dental advisory given  Plan Discussed with: Anesthesiologist and CRNA  Anesthesia Plan Comments: (PAT note written 10/13/2020 by Myra Gianotti, PA-C. Spanish speaking.   Patient is a 70 year old female scheduled for the above procedure.  History includes never smoker, HTN, murmur (mild MR [id number]), LV diastolic dysfunction (grade 1 2018), GERD, HLD, pre-diabetes, breast cancer (09/03/20 left breast biopsy: invasive ductal carcinoma, DCIS), right TKA (06/05/17), hysterectomy (by notes, attempted hysterectomy 1994 with ureteral injury, ureteral vaginal fistula; s/p right ureterotomy and repair laceration of left ureter with a ureteral neo-cystotomy; hysterectomy with ACUS/high risk HPV in 2009). Small remote lacunar infarct within the left lentiform nucleus 06/23/15 MRI. BMI is consistent with obesity. Spanish speaking.  Last ASA 10/05/20. Preoperative EKG showed NSR. Mild hypokalemia with K 3.0 with labs, but overall labs acceptable for OR. RLS 10/14/20 at 2:30 PM. Nuclear Med 10/15/20 at 11:00 AM. )      Anesthesia Quick Evaluation

## 2020-10-13 NOTE — Progress Notes (Signed)
Anesthesia Chart Review:  Case: [id number] Date/Time: 10/15/20 1115   Procedure: LEFT BREAST LUMPECTOMY WITH RADIOACTIVE SEED AND SENTINEL LYMPH NODE BIOPSY (Left: Breast)   Anesthesia type: General   Pre-op diagnosis: left breast cancer   Location: [facility] OR ROOM 09 / [place] [facility]   Surgeons: Stark Klein, MD       DISCUSSION: Patient is a 70 year old female scheduled for the above procedure.   History includes never smoker, HTN, murmur (mild MR 7209), LV diastolic dysfunction (grade 1 2018), GERD, HLD, pre-diabetes, breast cancer (09/03/20 left breast biopsy: invasive ductal carcinoma, DCIS), right TKA (06/05/17), hysterectomy (by notes, attempted hysterectomy 1994 with ureteral injury, ureteral vaginal fistula; s/p right ureterotomy and repair laceration of left ureter with a ureteral neo-cystotomy; hysterectomy with ACUS/high risk HPV in 2009). Small remote lacunar infarct within the left lentiform nucleus 06/23/15 MRI. BMI is consistent with obesity. Spanish speaking.   Last ASA 10/05/20. Preoperative EKG showed NSR. Mild hypokalemia with K 3.0 with labs, but overall labs acceptable for OR. RLS 10/14/20 at 2:30 PM. Nuclear Med 10/15/20 at 11:00 AM.  COVID 19 VACCINATION STATUS: fully vaccinated, Moderna x2 with [employer] booster 02/2020.  Anesthesia team to evaluate on the day of surgery.   VS: BP (!) 148/75   Pulse 70   Temp 36.9 C (Oral)   Resp 17   Ht 5' (1.524 m)   Wt 76.5 kg   SpO2 99%   BMI 32.95 kg/m   PROVIDERS: Leamon Arnt, MD is PCP  Magrinat, Sarajane Jews, MD is Edson Snowball, MD is RAD-ONC Uvaldo Rising, MD is GYN  LABS: Preoperative labs noted. K 3.0, on losartan-HCTZ. Last CBC on 09/09/20 was normal.   (all labs ordered are listed, but only abnormal results are displayed)  Labs Reviewed  GLUCOSE, CAPILLARY - Abnormal; Notable for the following components:      Result Value   Glucose-Capillary 116 (*)    All other components within normal limits  HEMOGLOBIN A1C -  Abnormal; Notable for the following components:   Hgb A1c MFr Bld 6.0 (*)    All other components within normal limits  BASIC METABOLIC PANEL - Abnormal; Notable for the following components:   Potassium 3.0 (*)    Chloride 93 (*)    Glucose, Bld 110 (*)    All other components within normal limits    EKG: 10/12/20: NSR   CV: Echo 06/22/16: Study Conclusions  - Left ventricle: The cavity size was normal. Systolic function was    normal. The estimated ejection fraction was in the range of 60%    to 65%. Wall motion was normal; there were no regional wall    motion abnormalities. Doppler parameters are consistent with    abnormal left ventricular relaxation (grade 1 diastolic    dysfunction).  - Mitral valve: There was mild regurgitation.  - Left atrium: The atrium was normal in size.  - Right ventricle: Systolic function was normal.  - Pulmonary arteries: Systolic pressure was within the normal    range.    Past Medical History:  Diagnosis Date   Anxiety    Arthritis    Breast cancer ([place])    Colon polyps 4709   Diastolic dysfunction, left ventricle 06/26/2016   (ECHO 2018): Left ventricle: The cavity size was normal. Systolic function was normal. The estimated ejection fraction was in the range of 60% to 65%. Wall motion was normal; there were no regional wall motion abnormalities. Doppler parameters are consistent with abnormal  left ventricular relaxation (grade 1 diastolic dysfunction).   Diverticulosis    Family history of brain cancer    Family history of breast cancer    Family history of pancreatic cancer    Family history of uterine cancer    GERD (gastroesophageal reflux disease)    watches what she eats   Headache    gets headaches when she eats sweets   Heart murmur    dx last yr.   had echo 05/2016   Hyperlipidemia    Hypertension    Mild mitral regurgitation 07/04/2016   Pneumonia    Pre-diabetes    Primary localized osteoarthritis of right knee  05/24/2017   Ureterovaginal fistula    Uterine fibroid 1994   Vaginal dysplasia     Past Surgical History:  Procedure Laterality Date   BREAST CYST ASPIRATION Right    BREAST EXCISIONAL BIOPSY Right    COLONOSCOPY  2010   COLONOSCOPY  2012   COLONOSCOPY  2016   CYSTOSCOPY     ESOPHAGOGASTRODUODENOSCOPY  2010   TOTAL ABDOMINAL HYSTERECTOMY  1994   TOTAL KNEE ARTHROPLASTY Right 06/05/2017   TOTAL KNEE ARTHROPLASTY Right 06/05/2017   Procedure: RIGHT TOTAL KNEE ARTHROPLASTY;  Surgeon: Elsie Saas, MD;  Location: [facility];  Service: Orthopedics;  Laterality: Right;   Ueterovaginal fistual repair     URETERAL EXPLORATION  04/16/1992   URETEROTOMY ON THE RIGHT, REPAIR LACERATION ON THE LEFT    MEDICATIONS:  aspirin EC 81 MG tablet   hydrochlorothiazide (HYDRODIURIL) 25 MG tablet   hydroxypropyl methylcellulose / hypromellose (ISOPTO TEARS / GONIOVISC) 2.5 % ophthalmic solution   losartan (COZAAR) 100 MG tablet   losartan-hydrochlorothiazide (HYZAAR) 100-25 MG tablet   Multiple Vitamin (MULTIVITAMIN) tablet   simvastatin (ZOCOR) 20 MG tablet   No current facility-administered medications for this encounter.  She in on combined losartan-HCTZ.  Myra Gianotti, PA-C Surgical Short Stay/Anesthesiology [facility] Phone [phone] [facility] Phone [phone] 10/13/2020 11:08 AM

## 2020-10-14 ENCOUNTER — Ambulatory Visit
Admission: RE | Admit: 2020-10-14 | Discharge: 2020-10-14 | Disposition: A | Discharge status: Home / Self Care | Payer: Medicare HMO | Source: Ambulatory Visit | Attending: General Surgery | Admitting: General Surgery

## 2020-10-14 ENCOUNTER — Other Ambulatory Visit: Payer: Self-pay | Admitting: General Surgery

## 2020-10-14 ENCOUNTER — Other Ambulatory Visit: Payer: Self-pay

## 2020-10-14 DIAGNOSIS — C50512 Malignant neoplasm of lower-outer quadrant of left female breast: Secondary | ICD-10-CM

## 2020-10-14 DIAGNOSIS — C50812 Malignant neoplasm of overlapping sites of left female breast: Secondary | ICD-10-CM | POA: Diagnosis not present

## 2020-10-14 DIAGNOSIS — Z170 Estrogen receptor positive status [ER+]: Secondary | ICD-10-CM

## 2020-10-15 ENCOUNTER — Encounter (HOSPITAL_COMMUNITY): Payer: Self-pay | Admitting: General Surgery

## 2020-10-15 ENCOUNTER — Ambulatory Visit
Admission: RE | Admit: 2020-10-15 | Discharge: 2020-10-15 | Disposition: A | Discharge status: Home / Self Care | Payer: Medicare HMO | Source: Ambulatory Visit | Attending: General Surgery | Admitting: General Surgery

## 2020-10-15 ENCOUNTER — Encounter (HOSPITAL_COMMUNITY): Payer: Medicare HMO

## 2020-10-15 ENCOUNTER — Ambulatory Visit (HOSPITAL_COMMUNITY): Payer: Medicare HMO | Admitting: Vascular Surgery

## 2020-10-15 ENCOUNTER — Ambulatory Visit (HOSPITAL_COMMUNITY)
Admission: RE | Admit: 2020-10-15 | Discharge: 2020-10-15 | Disposition: A | Discharge status: Home / Self Care | Payer: Medicare HMO | Attending: General Surgery | Admitting: General Surgery

## 2020-10-15 ENCOUNTER — Encounter (HOSPITAL_COMMUNITY)
Admission: RE | Disposition: A | Discharge status: Home / Self Care | Payer: Self-pay | Source: Home / Self Care | Attending: General Surgery

## 2020-10-15 ENCOUNTER — Encounter (HOSPITAL_COMMUNITY)
Admission: RE | Admit: 2020-10-15 | Discharge: 2020-10-15 | Disposition: A | Discharge status: Home / Self Care | Payer: Medicare HMO | Source: Ambulatory Visit | Attending: General Surgery | Admitting: General Surgery

## 2020-10-15 ENCOUNTER — Other Ambulatory Visit: Payer: Self-pay

## 2020-10-15 ENCOUNTER — Ambulatory Visit (HOSPITAL_COMMUNITY): Payer: Medicare HMO | Admitting: Anesthesiology

## 2020-10-15 DIAGNOSIS — C50512 Malignant neoplasm of lower-outer quadrant of left female breast: Secondary | ICD-10-CM

## 2020-10-15 DIAGNOSIS — Z170 Estrogen receptor positive status [ER+]: Secondary | ICD-10-CM

## 2020-10-15 DIAGNOSIS — C50912 Malignant neoplasm of unspecified site of left female breast: Secondary | ICD-10-CM | POA: Diagnosis not present

## 2020-10-15 DIAGNOSIS — Z79899 Other long term (current) drug therapy: Secondary | ICD-10-CM | POA: Insufficient documentation

## 2020-10-15 DIAGNOSIS — Z6832 Body mass index (BMI) 32.0-32.9, adult: Secondary | ICD-10-CM | POA: Insufficient documentation

## 2020-10-15 DIAGNOSIS — I10 Essential (primary) hypertension: Secondary | ICD-10-CM | POA: Diagnosis not present

## 2020-10-15 DIAGNOSIS — G8918 Other acute postprocedural pain: Secondary | ICD-10-CM | POA: Diagnosis not present

## 2020-10-15 DIAGNOSIS — E782 Mixed hyperlipidemia: Secondary | ICD-10-CM | POA: Diagnosis not present

## 2020-10-15 DIAGNOSIS — C50412 Malignant neoplasm of upper-outer quadrant of left female breast: Secondary | ICD-10-CM | POA: Diagnosis not present

## 2020-10-15 HISTORY — PX: BREAST LUMPECTOMY WITH RADIOACTIVE SEED AND SENTINEL LYMPH NODE BIOPSY: SHX6550

## 2020-10-15 LAB — GLUCOSE, CAPILLARY
Glucose-Capillary: 112 mg/dL — ABNORMAL HIGH (ref 70–99)
Glucose-Capillary: 118 mg/dL — ABNORMAL HIGH (ref 70–99)

## 2020-10-15 SURGERY — BREAST LUMPECTOMY WITH RADIOACTIVE SEED AND SENTINEL LYMPH NODE BIOPSY
Anesthesia: General | Site: Breast | Laterality: Left

## 2020-10-15 MED ORDER — ACETAMINOPHEN 500 MG PO TABS
1000.0000 mg | ORAL_TABLET | ORAL | Status: AC
  Administered 2020-10-15: 1000 mg via ORAL
  Filled 2020-10-15: qty 2

## 2020-10-15 MED ORDER — PHENYLEPHRINE 40 MCG/ML (10ML) SYRINGE FOR IV PUSH (FOR BLOOD PRESSURE SUPPORT)
PREFILLED_SYRINGE | INTRAVENOUS | Status: DC | PRN
  Administered 2020-10-15 (×2): 80 ug via INTRAVENOUS

## 2020-10-15 MED ORDER — ROPIVACAINE HCL 7.5 MG/ML IJ SOLN
INTRAMUSCULAR | Status: DC | PRN
  Administered 2020-10-15: 30 mL via PERINEURAL

## 2020-10-15 MED ORDER — FENTANYL CITRATE (PF) 250 MCG/5ML IJ SOLN
INTRAMUSCULAR | Status: AC
  Filled 2020-10-15: qty 5

## 2020-10-15 MED ORDER — MIDAZOLAM HCL 2 MG/2ML IJ SOLN
INTRAMUSCULAR | Status: AC
  Administered 2020-10-15: 2 mg via INTRAVENOUS
  Filled 2020-10-15: qty 2

## 2020-10-15 MED ORDER — BUPIVACAINE-EPINEPHRINE (PF) 0.25% -1:200000 IJ SOLN
INTRAMUSCULAR | Status: AC
  Filled 2020-10-15: qty 30

## 2020-10-15 MED ORDER — FENTANYL CITRATE (PF) 100 MCG/2ML IJ SOLN
INTRAMUSCULAR | Status: AC
  Filled 2020-10-15: qty 2

## 2020-10-15 MED ORDER — OXYCODONE HCL 5 MG PO TABS: 5.0000 mg | ORAL_TABLET | Freq: Once | ORAL | Status: DC | PRN

## 2020-10-15 MED ORDER — ENSURE PRE-SURGERY PO LIQD: 296.0000 mL | Freq: Once | ORAL | Status: DC

## 2020-10-15 MED ORDER — DEXAMETHASONE SODIUM PHOSPHATE 10 MG/ML IJ SOLN
INTRAMUSCULAR | Status: DC | PRN
  Administered 2020-10-15: 10 mg

## 2020-10-15 MED ORDER — EPHEDRINE SULFATE-NACL 50-0.9 MG/10ML-% IV SOSY
PREFILLED_SYRINGE | INTRAVENOUS | Status: DC | PRN
  Administered 2020-10-15 (×3): 5 mg via INTRAVENOUS

## 2020-10-15 MED ORDER — FENTANYL CITRATE (PF) 100 MCG/2ML IJ SOLN: 50.0000 ug | Freq: Once | INTRAMUSCULAR | Status: AC

## 2020-10-15 MED ORDER — LACTATED RINGERS IV SOLN: INTRAVENOUS | Status: DC

## 2020-10-15 MED ORDER — FENTANYL CITRATE (PF) 100 MCG/2ML IJ SOLN
INTRAMUSCULAR | Status: AC
  Administered 2020-10-15: 50 ug via INTRAVENOUS
  Filled 2020-10-15: qty 2

## 2020-10-15 MED ORDER — ORAL CARE MOUTH RINSE: 15.0000 mL | Freq: Once | OROMUCOSAL | Status: AC

## 2020-10-15 MED ORDER — TECHNETIUM TC 99M TILMANOCEPT KIT
1.0000 | PACK | Freq: Once | INTRAVENOUS | Status: AC | PRN
  Administered 2020-10-15: 1 via INTRADERMAL

## 2020-10-15 MED ORDER — OXYCODONE HCL 5 MG PO TABS: 5.0000 mg | ORAL_TABLET | Freq: Four times a day (QID) | ORAL | 0 refills | Status: DC | PRN

## 2020-10-15 MED ORDER — CHLORHEXIDINE GLUCONATE CLOTH 2 % EX PADS: 6.0000 | MEDICATED_PAD | Freq: Once | CUTANEOUS | Status: DC

## 2020-10-15 MED ORDER — CEFAZOLIN SODIUM-DEXTROSE 2-4 GM/100ML-% IV SOLN
2.0000 g | INTRAVENOUS | Status: AC
  Administered 2020-10-15: 2 g via INTRAVENOUS
  Filled 2020-10-15: qty 100

## 2020-10-15 MED ORDER — 0.9 % SODIUM CHLORIDE (POUR BTL) OPTIME
TOPICAL | Status: DC | PRN
  Administered 2020-10-15: 1000 mL

## 2020-10-15 MED ORDER — ORAL CARE MOUTH RINSE: 15.0000 mL | Freq: Once | OROMUCOSAL | Status: DC

## 2020-10-15 MED ORDER — ONDANSETRON HCL 4 MG/2ML IJ SOLN: 4.0000 mg | Freq: Once | INTRAMUSCULAR | Status: DC | PRN

## 2020-10-15 MED ORDER — LACTATED RINGERS IV SOLN: INTRAVENOUS | Status: DC | PRN

## 2020-10-15 MED ORDER — LIDOCAINE 2% (20 MG/ML) 5 ML SYRINGE
INTRAMUSCULAR | Status: DC | PRN
  Administered 2020-10-15: 100 mg via INTRAVENOUS

## 2020-10-15 MED ORDER — LIDOCAINE HCL 1 % IJ SOLN
INTRAMUSCULAR | Status: DC | PRN
  Administered 2020-10-15: 12 mL via INTRAMUSCULAR

## 2020-10-15 MED ORDER — ONDANSETRON HCL 4 MG/2ML IJ SOLN
INTRAMUSCULAR | Status: DC | PRN
  Administered 2020-10-15: 4 mg via INTRAVENOUS

## 2020-10-15 MED ORDER — MIDAZOLAM HCL 2 MG/2ML IJ SOLN: 2.0000 mg | Freq: Once | INTRAMUSCULAR | Status: AC

## 2020-10-15 MED ORDER — MIDAZOLAM HCL 2 MG/2ML IJ SOLN
INTRAMUSCULAR | Status: AC
  Filled 2020-10-15: qty 2

## 2020-10-15 MED ORDER — LIDOCAINE HCL (PF) 1 % IJ SOLN
INTRAMUSCULAR | Status: AC
  Filled 2020-10-15: qty 30

## 2020-10-15 MED ORDER — PROPOFOL 10 MG/ML IV BOLUS
INTRAVENOUS | Status: DC | PRN
  Administered 2020-10-15: 20 mg via INTRAVENOUS
  Administered 2020-10-15: 250 mg via INTRAVENOUS

## 2020-10-15 MED ORDER — GLYCOPYRROLATE PF 0.2 MG/ML IJ SOSY
PREFILLED_SYRINGE | INTRAMUSCULAR | Status: AC
  Filled 2020-10-15: qty 1

## 2020-10-15 MED ORDER — FENTANYL CITRATE (PF) 250 MCG/5ML IJ SOLN
INTRAMUSCULAR | Status: DC | PRN
  Administered 2020-10-15: 25 ug via INTRAVENOUS

## 2020-10-15 MED ORDER — EPHEDRINE 5 MG/ML INJ
INTRAVENOUS | Status: AC
  Filled 2020-10-15: qty 5

## 2020-10-15 MED ORDER — OXYCODONE HCL 5 MG/5ML PO SOLN
5.0000 mg | Freq: Once | ORAL | Status: DC | PRN
Start: 2020-10-15 — End: 2020-10-15

## 2020-10-15 MED ORDER — PROPOFOL 10 MG/ML IV BOLUS
INTRAVENOUS | Status: AC
  Filled 2020-10-15: qty 20

## 2020-10-15 MED ORDER — GLYCOPYRROLATE PF 0.2 MG/ML IJ SOSY
PREFILLED_SYRINGE | INTRAMUSCULAR | Status: DC | PRN
  Administered 2020-10-15: .2 mg via INTRAVENOUS

## 2020-10-15 MED ORDER — CHLORHEXIDINE GLUCONATE 0.12 % MT SOLN: 15.0000 mL | Freq: Once | OROMUCOSAL | Status: DC

## 2020-10-15 MED ORDER — PHENYLEPHRINE 40 MCG/ML (10ML) SYRINGE FOR IV PUSH (FOR BLOOD PRESSURE SUPPORT)
PREFILLED_SYRINGE | INTRAVENOUS | Status: AC
  Filled 2020-10-15: qty 10

## 2020-10-15 MED ORDER — FENTANYL CITRATE (PF) 100 MCG/2ML IJ SOLN
25.0000 ug | INTRAMUSCULAR | Status: DC | PRN
  Administered 2020-10-15 (×2): 25 ug via INTRAVENOUS

## 2020-10-15 MED ORDER — ACETAMINOPHEN 160 MG/5ML PO SOLN: 325.0000 mg | ORAL | Status: DC | PRN

## 2020-10-15 MED ORDER — CHLORHEXIDINE GLUCONATE 0.12 % MT SOLN
15.0000 mL | Freq: Once | OROMUCOSAL | Status: AC
  Administered 2020-10-15: 15 mL via OROMUCOSAL
  Filled 2020-10-15: qty 15

## 2020-10-15 MED ORDER — ONDANSETRON HCL 4 MG/2ML IJ SOLN
INTRAMUSCULAR | Status: AC
  Filled 2020-10-15: qty 2

## 2020-10-15 MED ORDER — PHENYLEPHRINE HCL-NACL 10-0.9 MG/250ML-% IV SOLN
INTRAVENOUS | Status: DC | PRN
Start: 2020-10-15 — End: 2020-10-15
  Administered 2020-10-15: 25 ug/min via INTRAVENOUS

## 2020-10-15 MED ORDER — ACETAMINOPHEN 325 MG PO TABS: 325.0000 mg | ORAL_TABLET | ORAL | Status: DC | PRN

## 2020-10-15 MED ORDER — DEXAMETHASONE SODIUM PHOSPHATE 10 MG/ML IJ SOLN
INTRAMUSCULAR | Status: AC
  Filled 2020-10-15: qty 1

## 2020-10-15 MED ORDER — DEXAMETHASONE SODIUM PHOSPHATE 10 MG/ML IJ SOLN
INTRAMUSCULAR | Status: DC | PRN
  Administered 2020-10-15: 5 mg via INTRAVENOUS

## 2020-10-15 MED ORDER — MEPERIDINE HCL 25 MG/ML IJ SOLN: 6.2500 mg | INTRAMUSCULAR | Status: DC | PRN

## 2020-10-15 SURGICAL SUPPLY — 46 items
BAG COUNTER SPONGE SURGICOUNT (BAG) ×2 IMPLANT
BINDER BREAST LRG (GAUZE/BANDAGES/DRESSINGS) IMPLANT
BINDER BREAST XLRG (GAUZE/BANDAGES/DRESSINGS) ×2 IMPLANT
BNDG COHESIVE 4X5 TAN STRL (GAUZE/BANDAGES/DRESSINGS) ×2 IMPLANT
CANISTER SUCT 3000ML PPV (MISCELLANEOUS) ×2 IMPLANT
CHLORAPREP W/TINT 26 (MISCELLANEOUS) ×2 IMPLANT
CLIP VESOCCLUDE LG 6/CT (CLIP) ×2 IMPLANT
CLIP VESOCCLUDE MED 6/CT (CLIP) ×8 IMPLANT
CLIP VESOCCLUDE SM WIDE 6/CT (CLIP) ×2 IMPLANT
CLSR STERI-STRIP ANTIMIC 1/2X4 (GAUZE/BANDAGES/DRESSINGS) ×2 IMPLANT
CNTNR URN SCR LID CUP LEK RST (MISCELLANEOUS) ×3 IMPLANT
CONT SPEC 4OZ STRL OR WHT (MISCELLANEOUS) ×3
COVER PROBE W GEL 5X96 (DRAPES) ×2 IMPLANT
COVER SURGICAL LIGHT HANDLE (MISCELLANEOUS) ×2 IMPLANT
DERMABOND ADVANCED (GAUZE/BANDAGES/DRESSINGS) ×1
DERMABOND ADVANCED .7 DNX12 (GAUZE/BANDAGES/DRESSINGS) ×1 IMPLANT
DEVICE DUBIN SPECIMEN MAMMOGRA (MISCELLANEOUS) IMPLANT
DRAPE CHEST BREAST 15X10 FENES (DRAPES) ×2 IMPLANT
DRAPE SURG 17X23 STRL (DRAPES) IMPLANT
ELECT COATED BLADE 2.86 ST (ELECTRODE) ×2 IMPLANT
ELECT NEEDLE BLADE 2-5/6 (NEEDLE) ×2 IMPLANT
ELECT REM PT RETURN 9FT ADLT (ELECTROSURGICAL) ×2
ELECTRODE REM PT RTRN 9FT ADLT (ELECTROSURGICAL) ×1 IMPLANT
GAUZE SPONGE 4X4 12PLY STRL (GAUZE/BANDAGES/DRESSINGS) ×2 IMPLANT
GLOVE SURG ENC MOIS LTX SZ6 (GLOVE) ×2 IMPLANT
GLOVE SURG UNDER LTX SZ6.5 (GLOVE) ×2 IMPLANT
GOWN STRL REUS W/ TWL LRG LVL3 (GOWN DISPOSABLE) ×1 IMPLANT
GOWN STRL REUS W/TWL 2XL LVL3 (GOWN DISPOSABLE) ×2 IMPLANT
GOWN STRL REUS W/TWL LRG LVL3 (GOWN DISPOSABLE) ×1
KIT BASIN OR (CUSTOM PROCEDURE TRAY) ×2 IMPLANT
KIT MARKER MARGIN INK (KITS) ×2 IMPLANT
LIGHT WAVEGUIDE WIDE FLAT (MISCELLANEOUS) ×2 IMPLANT
NEEDLE 18GX1X1/2 (RX/OR ONLY) (NEEDLE) IMPLANT
NEEDLE FILTER BLUNT 18X 1/2SAF (NEEDLE)
NEEDLE FILTER BLUNT 18X1 1/2 (NEEDLE) IMPLANT
NEEDLE HYPO 25GX1X1/2 BEV (NEEDLE) ×2 IMPLANT
NS IRRIG 1000ML POUR BTL (IV SOLUTION) ×2 IMPLANT
PACK GENERAL/GYN (CUSTOM PROCEDURE TRAY) ×2 IMPLANT
PACK UNIVERSAL I (CUSTOM PROCEDURE TRAY) ×2 IMPLANT
PENCIL SMOKE EVACUATOR (MISCELLANEOUS) ×2 IMPLANT
STOCKINETTE IMPERVIOUS 9X36 MD (GAUZE/BANDAGES/DRESSINGS) ×2 IMPLANT
SUT MNCRL AB 4-0 PS2 18 (SUTURE) ×2 IMPLANT
SUT VIC AB 3-0 SH 8-18 (SUTURE) ×2 IMPLANT
SYR CONTROL 10ML LL (SYRINGE) ×2 IMPLANT
TOWEL GREEN STERILE (TOWEL DISPOSABLE) ×2 IMPLANT
TOWEL GREEN STERILE FF (TOWEL DISPOSABLE) ×2 IMPLANT

## 2020-10-15 NOTE — Anesthesia Procedure Notes (Signed)
Procedure Name: LMA Insertion Date/Time: 10/15/2020 12:42 PM Performed by: Gaylene Brooks, CRNA Pre-anesthesia Checklist: Patient identified, Emergency Drugs available, Suction available and Patient being monitored Patient Re-evaluated:Patient Re-evaluated prior to induction Oxygen Delivery Method: Circle System Utilized Preoxygenation: Pre-oxygenation with 100% oxygen Induction Type: IV induction Ventilation: Mask ventilation without difficulty LMA: LMA inserted LMA Size: 4.0 Number of attempts: 1 Airway Equipment and Method: Bite block Placement Confirmation: positive ETCO2 Tube secured with: Tape Dental Injury: Teeth and Oropharynx as per pre-operative assessment

## 2020-10-15 NOTE — Discharge Instructions (Addendum)
[facility] Office Phone Number [phone]  BREAST BIOPSY/ PARTIAL MASTECTOMY: POST OP INSTRUCTIONS  Always review your discharge instruction sheet given to you by the facility where your surgery was performed.  IF YOU HAVE DISABILITY OR FAMILY LEAVE FORMS, YOU MUST BRING THEM TO THE OFFICE FOR PROCESSING.  DO NOT GIVE THEM TO YOUR DOCTOR.  A prescription for pain medication may be given to you upon discharge.  Take your pain medication as prescribed, if needed.  If narcotic pain medicine is not needed, then you may take acetaminophen (Tylenol) or ibuprofen (Advil) as needed. Take your usually prescribed medications unless otherwise directed If you need a refill on your pain medication, please contact your pharmacy.  They will contact our office to request authorization.  Prescriptions will not be filled after 5pm or on week-ends. You should eat very light the first 24 hours after surgery, such as soup, crackers, pudding, etc.  Resume your normal diet the day after surgery. Most patients will experience some swelling and bruising in the breast.  Ice packs and a good support bra will help.  Swelling and bruising can take several days to resolve.  It is common to experience some constipation if taking pain medication after surgery.  Increasing fluid intake and taking a stool softener will usually help or prevent this problem from occurring.  A mild laxative (Milk of Magnesia or Miralax) should be taken according to package directions if there are no bowel movements after 48 hours. Unless discharge instructions indicate otherwise, you may remove your bandages 48 hours after surgery, and you may shower at that time.  You may have steri-strips (small skin tapes) in place directly over the incision.  These strips should be left on the skin for 7-10 days.   Any sutures or staples will be removed at the office during your follow-up visit. ACTIVITIES:  You may resume regular daily activities  (gradually increasing) beginning the next day.  Wearing a good support bra or sports bra (or the breast binder) minimizes pain and swelling.  You may have sexual intercourse when it is comfortable. You may drive when you no longer are taking prescription pain medication, you can comfortably wear a seatbelt, and you can safely maneuver your car and apply brakes. RETURN TO WORK:  __________1 week_______________ You should see your doctor in the office for a follow-up appointment approximately two weeks after your surgery.  Your doctor's nurse will typically make your follow-up appointment when she calls you with your pathology report.  Expect your pathology report 2-3 business days after your surgery.  You may call to check if you do not hear from us after three days.   WHEN TO CALL YOUR DOCTOR: Fever over 101.0 Nausea and/or vomiting. Extreme swelling or bruising. Continued bleeding from incision. Increased pain, redness, or drainage from the incision.  The clinic staff is available to answer your questions during regular business hours.  Please don't hesitate to call and ask to speak to one of the nurses for clinical concerns.  If you have a medical emergency, go to the nearest emergency room or call 911.  A surgeon from [facility] is always on call at the hospital.  For further questions, please visit [email or web address]   

## 2020-10-15 NOTE — Anesthesia Procedure Notes (Signed)
Anesthesia Regional Block: Pectoralis block   Pre-Anesthetic Checklist: , timeout performed,  Correct Patient, Correct Site, Correct Laterality,  Correct Procedure, Correct Position, site marked,  Risks and benefits discussed,  Surgical consent,  Pre-op evaluation,  At surgeon's request and post-op pain management  Laterality: Left  Prep: chloraprep       Needles:  Injection technique: Single-shot  Needle Type: Echogenic Stimulator Needle     Needle Length: 5cm  Needle Gauge: 22     Additional Needles:   Procedures:, nerve stimulator,,, ultrasound used (permanent image in chart),,    Narrative:  Start time: 10/15/2020 10:20 AM End time: 10/15/2020 10:31 AM Injection made incrementally with aspirations every 5 mL.  Performed by: Personally  Anesthesiologist: Janeece Riggers, MD  Additional Notes: Functioning IV was confirmed and monitors were applied.  A 107mm 22ga Arrow echogenic stimulator needle was used. Sterile prep and drape,hand hygiene and sterile gloves were used. Ultrasound guidance: relevant anatomy identified, needle position confirmed, local anesthetic spread visualized around nerve(s)., vascular puncture avoided.  Image printed for medical record. Negative aspiration and negative test dose prior to incremental administration of local anesthetic. The patient tolerated the procedure well.

## 2020-10-15 NOTE — Op Note (Signed)
left Breast Radioactive seed localized lumpectomy and sentinel lymph node biopsy  Indications: This patient presents with history of left breast cancer, lower outer quadrant, cT1cN0, grade 1 invasive ductal carcinoma with DCIS, +/+/-.    Pre-operative Diagnosis: left breast cancer  Post-operative Diagnosis: Same  Surgeon: Stark Klein   Anesthesia: General endotracheal anesthesia  ASA Class: 3  Procedure Details  The patient was seen in the Holding Room. The risks, benefits, complications, treatment options, and expected outcomes were discussed with the patient. The possibilities of bleeding, infection, the need for additional procedures, failure to diagnose a condition, and creating a complication requiring transfusion or operation were discussed with the patient. The patient concurred with the proposed plan, giving informed consent.  The site of surgery properly noted/marked. The patient was taken to Operating Room # 9, identified, and the procedure verified as left Breast Seed localized Lumpectomy with sentinel lymph node biopsy. A Time Out was held and the above information confirmed.  The left arm, breast, and chest were prepped and draped in standard fashion. The lumpectomy was performed by creating a inferior circumareolar incision near the previously placed radioactive seed.  Dissection was carried down to around the point of maximum signal intensity. The cautery was used to perform the dissection.  Hemostasis was achieved with cautery. The edges of the cavity were marked with large clips, with one each medial, lateral, inferior and superior, and two clips posteriorly.   The specimen was inked with the margin marker paint kit.    Specimen radiography confirmed inclusion of the mammographic lesion, the clip, and the seed.  The background signal in the breast was zero.  The wound was irrigated and closed with 3-0 vicryl in layers and 4-0 monocryl subcuticular suture.    Using a hand-held  gamma probe, left axillary sentinel nodes were identified transcutaneously.  An oblique incision was created below the axillary hairline.  Dissection was carried through the clavipectoral fascia.  Four deep level two axillary sentinel nodes were removed.  Counts per second were 93, 71, 50, and 1300.    The background count was 12 cps.  The wound was irrigated.  Hemostasis was achieved with cautery.  The axillary incision was closed with a 3-0 vicryl deep dermal interrupted sutures and a 4-0 monocryl subcuticular closure.    Sterile dressings were applied. At the end of the operation, all sponge, instrument, and needle counts were correct.  Findings: grossly clear surgical margins and no adenopathy, posterior margin is pectoralis, anterior margin is skin  Estimated Blood Loss:  min         Specimens: left breast tissue with seed and four deep axillary sentinel lymph nodes.             Complications:  None; patient tolerated the procedure well.         Disposition: PACU - hemodynamically stable.         Condition: stable

## 2020-10-15 NOTE — Anesthesia Postprocedure Evaluation (Signed)
Anesthesia Post Note  Patient: Glass blower/designer  Procedure(s) Performed: LEFT BREAST LUMPECTOMY WITH RADIOACTIVE SEED AND SENTINEL LYMPH NODE BIOPSY (Left: Breast)     Patient location during evaluation: PACU Anesthesia Type: General Level of consciousness: awake and alert Pain management: pain level controlled Vital Signs Assessment: post-procedure vital signs reviewed and stable Respiratory status: spontaneous breathing, nonlabored ventilation, respiratory function stable and patient connected to nasal cannula oxygen Cardiovascular status: blood pressure returned to baseline and stable Postop Assessment: no apparent nausea or vomiting Anesthetic complications: no   No notable events documented.  Last Vitals:  Vitals:   10/15/20 1535 10/15/20 1550  BP: 116/61 (!) 113/58  Pulse: (!) 57 (!) 58  Resp: 10 12  Temp: (!) 36.2 C   SpO2: 96% 93%    Last Pain:  Vitals:   10/15/20 1535  TempSrc:   PainSc: 2                  Debra Simon

## 2020-10-15 NOTE — Interval H&P Note (Signed)
History and Physical Interval Note:  10/15/2020 9:11 AM  Debra Simon  has presented today for surgery, with the diagnosis of left breast cancer.  The various methods of treatment have been discussed with the patient and family. After consideration of risks, benefits and other options for treatment, the patient has consented to  Procedure(s): LEFT BREAST LUMPECTOMY WITH RADIOACTIVE SEED AND SENTINEL LYMPH NODE BIOPSY (Left) as a surgical intervention.  The patient's history has been reviewed, patient examined, no change in status, stable for surgery.  I have reviewed the patient's chart and labs.  Questions were answered to the patient's satisfaction.     Stark Klein

## 2020-10-15 NOTE — Transfer of Care (Signed)
Immediate Anesthesia Transfer of Care Note  Patient: Debra Simon  Procedure(s) Performed: LEFT BREAST LUMPECTOMY WITH RADIOACTIVE SEED AND SENTINEL LYMPH NODE BIOPSY (Left: Breast)  Patient Location: PACU  Anesthesia Type:GA combined with regional for post-op pain  Level of Consciousness: awake, alert  and oriented  Airway & Oxygen Therapy: Patient Spontanous Breathing and Patient connected to nasal cannula oxygen  Post-op Assessment: Report given to RN and Post -op Vital signs reviewed and stable  Post vital signs: Reviewed and stable  Last Vitals:  Vitals Value Taken Time  BP    Temp    Pulse    Resp    SpO2      Last Pain:  Vitals:   10/15/20 0928  TempSrc:   PainSc: 0-No pain         Complications: No notable events documented.

## 2020-10-16 ENCOUNTER — Encounter (HOSPITAL_COMMUNITY): Payer: Self-pay | Admitting: General Surgery

## 2020-10-16 ENCOUNTER — Ambulatory Visit: Payer: Medicare HMO | Admitting: Family Medicine

## 2020-10-19 LAB — SURGICAL PATHOLOGY

## 2020-10-20 ENCOUNTER — Encounter: Payer: Self-pay | Admitting: *Deleted

## 2020-10-20 ENCOUNTER — Telehealth: Payer: Self-pay | Admitting: *Deleted

## 2020-10-20 NOTE — Telephone Encounter (Signed)
Ordered oncotype per Dr. Magrinat. Faxed requisition to pathology and [employer]. 

## 2020-10-27 DIAGNOSIS — Z170 Estrogen receptor positive status [ER+]: Secondary | ICD-10-CM | POA: Diagnosis not present

## 2020-10-27 DIAGNOSIS — C50512 Malignant neoplasm of lower-outer quadrant of left female breast: Secondary | ICD-10-CM | POA: Diagnosis not present

## 2020-10-29 ENCOUNTER — Telehealth: Payer: Self-pay | Admitting: *Deleted

## 2020-10-29 ENCOUNTER — Encounter: Payer: Self-pay | Admitting: *Deleted

## 2020-10-29 DIAGNOSIS — C50512 Malignant neoplasm of lower-outer quadrant of left female breast: Secondary | ICD-10-CM

## 2020-10-29 NOTE — Telephone Encounter (Signed)
Received oncotype results of 15/4%.

## 2020-10-30 ENCOUNTER — Encounter (HOSPITAL_COMMUNITY): Payer: Self-pay

## 2020-10-30 NOTE — Progress Notes (Signed)
Location of Breast Cancer: lower-outer quadrant of left breast   Histology per Pathology Report:  Biopsy on 09/03/20 showed: invasive ductal carcinoma.  Receptor Status:  estrogen receptor, 95%% positive and progesterone receptor, 95% positive, both with strong staining intensity. Proliferation marker Ki67 at 15%%. HER2 negative.  Did patient present with symptoms (if so, please note symptoms) or was this found on screening mammography?: routine screening mammography on 08/06/2020 showing a possible abnormality in the left breast.  Past/Anticipated interventions by surgeon, if any:  left Breast Radioactive seed localized lumpectomy and sentinel lymph node biopsy Surgeon: Stark Klein  Past/Anticipated interventions by medical oncology, if any: Dr Jana Hakim overall plan is for genetics testing, definitive surgery, Oncotype testing, likely no chemotherapy, then adjuvant radiation and antiestrogens  Lymphedema issues, if any:  no    Pain issues, if any:  no   SAFETY ISSUES: Prior radiation? no Pacemaker/ICD? no Possible current pregnancy? no, hysterectomy Is the patient on methotrexate? no  Current Complaints / other details:  headaches    Vitals:   11/11/20 1329  BP: 140/76  Pulse: 72  Resp: 18  Temp: 98.1 F (36.7 C)  SpO2: 99%  Weight: 167 lb 6.4 oz (75.9 kg)  Height: _0  (1.549 m)

## 2020-11-03 ENCOUNTER — Encounter: Payer: Self-pay | Admitting: *Deleted

## 2020-11-10 NOTE — Progress Notes (Signed)
Radiation Oncology         ([phone]) [phone] ________________________________  Name: Debra Simon MRN: [id number]  Date: 11/11/2020  DOB: 1950/09/03  Re-Evaluation Note  CC: Leamon Arnt, MD  Magrinat, Virgie Dad, MD    ICD-10-CM   1. Malignant neoplasm of lower-outer quadrant of left breast of female, estrogen receptor positive ([place])  C50.512    Z17.0       Diagnosis: Stage IA (cT1c, cN0, cM0) Left Breast LOQ, Invasive Ductal Carcinoma with intermediate grade DCIS, ER+ / PR+ / Her2-, Grade 2  Narrative:  The patient returns today to discuss radiation treatment options. She was seen in the multidisciplinary breast clinic on 09/09/20.   Since consultation, she underwent genetic testing on 09/09/20 (results received on 09/16/20). Results were negative.  She opted to proceed with left breast lumpectomy, with left sentinel lymph node biopsies on 10/15/20. Pathology from the procedure revealed: grade 2 invasive ductal carcinoma measuring 1.1 cm; and intermediate grade ductal carcinoma in-situ focally involving a papillary lesion; resection margins negative for carcinoma. 5/5 left axillary sentinel lymph nodes were negative for carcinoma. Prognostic indicators significant for ER: >95%, positive, PR: >95%, positive, both with strong staining intensity; Her2: negative; Ki67: 15%.   Oncotype DX was obtained on the final surgical sample and the recurrence score of 15 predicts a risk of recurrence outside the breast over the next 9 years of 4%, if the patient's only systemic therapy is an antiestrogen for 5 years. It also predicts no significant benefit from chemotherapy.   On review of systems, the patient reports no pain within the breast or axillary region. She denies swelling in her left arm or hand and any other symptoms.  Communication is with the assistance of a Spanish interpreter   Allergies:  has No Known Allergies.  Meds: Current Outpatient Medications  Medication Sig Dispense Refill    hydroxypropyl methylcellulose / hypromellose (ISOPTO TEARS / GONIOVISC) 2.5 % ophthalmic solution Place 1 drop into both eyes 3 (three) times daily as needed for dry eyes.     losartan-hydrochlorothiazide (HYZAAR) 100-25 MG tablet Take 1 tablet by mouth daily.     simvastatin (ZOCOR) 20 MG tablet Take 1 tablet (20 mg total) by mouth at bedtime. 90 tablet 3   aspirin EC 81 MG tablet Take 1 tablet (81 mg total) by mouth daily. (Patient not taking: Reported on 11/11/2020)     hydrochlorothiazide (HYDRODIURIL) 25 MG tablet TAKE 1 TABLET (25 MG TOTAL) BY MOUTH DAILY. (Patient not taking: No sig reported) 90 tablet 1   losartan (COZAAR) 100 MG tablet Take 1 tablet (100 mg total) by mouth daily. (Patient not taking: No sig reported) 60 tablet 0   Multiple Vitamin (MULTIVITAMIN) tablet Take 1 tablet by mouth daily. (Patient not taking: Reported on 11/11/2020)     oxyCODONE (OXY IR/ROXICODONE) 5 MG immediate release tablet Take 1 tablet (5 mg total) by mouth every 6 (six) hours as needed for severe pain. (Patient not taking: Reported on 11/11/2020) 15 tablet 0   No current facility-administered medications for this encounter.    Physical Findings: The patient is in no acute distress. Patient is alert and oriented.  height is $RemoveB'5\' 1"'MSbeglzw$  (1.549 m) and weight is 167 lb 6.4 oz (75.9 kg). Her temperature is 98.1 F (36.7 C). Her blood pressure is 140/76 and her pulse is 72. Her respiration is 18 and oxygen saturation is 99%.  No significant changes. Lungs are clear to auscultation bilaterally. Heart has regular rate and rhythm.  No palpable cervical, supraclavicular, or axillary adenopathy. Abdomen soft, non-tender, normal bowel sounds.  Left breast: Well-healed.  periAreolar scar in the inferior aspect of the nipple areolar complex.  Separate scar in the low axillary region from the patient's sentinel node procedure both of these scars well-healed without signs of drainage or infection.  No dominant mass appreciated  in the breast nipple discharge or bleeding.  Lab Findings: Lab Results  Component Value Date   WBC 6.4 09/09/2020   HGB 13.0 09/09/2020   HCT 39.3 09/09/2020   MCV 88.5 09/09/2020   PLT 234 09/09/2020    Radiographic Findings: NM Sentinel Node Inj-No Rpt (Breast)  Result Date: 10/15/2020 Sulfur Colloid was injected by the Nuclear Medicine Technologist for sentinel lymph node localization.   MM Breast Surgical Specimen  Result Date: 10/15/2020 CLINICAL DATA:  Evaluate surgical specimen following lumpectomy for LEFT breast cancer. EXAM: SPECIMEN RADIOGRAPH OF THE LEFT BREAST COMPARISON:  Previous exam(s). FINDINGS: Status post excision of the LEFT breast. The radioactive seed and RIBBON biopsy marker clip are present, completely intact, and were marked for pathology. IMPRESSION: Specimen radiograph of the LEFT breast. Electronically Signed   By: Margarette Canada M.D.   On: 10/15/2020 13:30  [place] LT RADIOACTIVE SEED LOC  Result Date: 10/14/2020 CLINICAL DATA:  Patient presents for seed localization prior to LEFT lumpectomy for recently diagnosed invasive ductal carcinoma and ductal carcinoma in situ. EXAM: ULTRASOUND GUIDED RADIOACTIVE SEED LOCALIZATION OF THE LEFT BREAST COMPARISON:  Previous exam(s). FINDINGS: Patient presents for radioactive seed localization prior to lumpectomy. I met with the patient and we discussed the procedure of seed localization including benefits and alternatives. We discussed the high likelihood of a successful procedure. We discussed the risks of the procedure including infection, bleeding, tissue injury and further surgery. We discussed the low dose of radioactivity involved in the procedure. Informed, written consent was given. The usual time-out protocol was performed immediately prior to the procedure. Using ultrasound guidance, sterile technique, 1% lidocaine and an I-125 radioactive seed, the mass in the 6 o'clock retroareolar region of the LEFT breast was localized  using a LATERAL to MEDIAL approach. The follow-up mammogram images confirm the seed in the expected location and were marked for Dr. Barry Dienes. Follow-up survey of the patient confirms presence of the radioactive seed. Order number of I-125 seed:  [id number]. Total activity:  7.616 millicuries reference Date: 10/07/2020 The patient tolerated the procedure well and was released from the [facility]. She was given instructions regarding seed removal. IMPRESSION: Radioactive seed localization left breast. No apparent complications. Electronically Signed   By: Nolon Nations M.D.   On: 10/14/2020 15:58  MM CLIP PLACEMENT LEFT  Result Date: 10/14/2020 CLINICAL DATA:  Status post ultrasound-guided placement of radioactive seed in the LEFT breast. EXAM: 3D DIAGNOSTIC LEFT MAMMOGRAM POST ULTRASOUND GUIDED SEED PLACEMENT COMPARISON:  Previous exam(s). FINDINGS: 3D Mammographic images were obtained following ultrasound guided placement of radioactive seed in the 6 o'clock location of the LEFT breast. The radioactive seed is within the mass in the 6 o'clock location, adjacent to previously placed ribbon shaped clip. IMPRESSION: Appropriate positioning of the radioactive seed in the LOWER central LEFT breast. Final Assessment: Post Procedure Mammograms for Marker Placement Electronically Signed   By: Nolon Nations M.D.   On: 10/14/2020 15:59   Impression:  Stage IA (cT1c, cN0, cM0) Left Breast LOQ, Invasive Ductal Carcinoma with intermediate grade DCIS, ER+ / PR+ / Her2-, Grade 2  The patient would be an excellent candidate  for breast conservation with radiation therapy directed at the left breast area.  I discussed the overall treatment course side effects and potential toxicities of radiation therapy in this situation with the assistance of a Spanish interpreter.  The patient appears to understand and wishes to proceed with planned course of treatment.  Plan:  Patient is scheduled for CT simulation later today.   Treatments to begin in approximately 1 week.  She will receive approximately 21 treatments directed at the left breast as she is a candidate for hypofractionated accelerated radiation therapy.  We will use cardiac sparing techniques if necessary.  -----------------------------------  Blair Promise, PhD, MD  This document serves as a record of services personally performed by Gery Pray, MD. It was created on his behalf by Roney Mans, a trained medical scribe. The creation of this record is based on the scribe's personal observations and the provider's statements to them. This document has been checked and approved by the attending provider.

## 2020-11-11 ENCOUNTER — Encounter: Payer: Self-pay | Admitting: Radiation Oncology

## 2020-11-11 ENCOUNTER — Ambulatory Visit
Admission: RE | Admit: 2020-11-11 | Discharge: 2020-11-11 | Disposition: A | Discharge status: Home / Self Care | Payer: Medicare HMO | Source: Ambulatory Visit | Attending: Radiation Oncology | Admitting: Radiation Oncology

## 2020-11-11 ENCOUNTER — Other Ambulatory Visit: Payer: Self-pay

## 2020-11-11 VITALS — BP 140/76 | HR 72 | Temp 98.1°F | Resp 18 | Ht 61.0 in | Wt 167.4 lb

## 2020-11-11 DIAGNOSIS — Z7982 Long term (current) use of aspirin: Secondary | ICD-10-CM | POA: Diagnosis not present

## 2020-11-11 DIAGNOSIS — Z79899 Other long term (current) drug therapy: Secondary | ICD-10-CM | POA: Insufficient documentation

## 2020-11-11 DIAGNOSIS — Z510 Encounter for antineoplastic radiation therapy: Secondary | ICD-10-CM | POA: Insufficient documentation

## 2020-11-11 DIAGNOSIS — C50412 Malignant neoplasm of upper-outer quadrant of left female breast: Secondary | ICD-10-CM | POA: Diagnosis not present

## 2020-11-11 DIAGNOSIS — Z923 Personal history of irradiation: Secondary | ICD-10-CM | POA: Diagnosis not present

## 2020-11-11 DIAGNOSIS — C50512 Malignant neoplasm of lower-outer quadrant of left female breast: Secondary | ICD-10-CM | POA: Insufficient documentation

## 2020-11-11 DIAGNOSIS — Z170 Estrogen receptor positive status [ER+]: Secondary | ICD-10-CM | POA: Diagnosis not present

## 2020-11-11 DIAGNOSIS — R7303 Prediabetes: Secondary | ICD-10-CM

## 2020-11-11 HISTORY — DX: Prediabetes: R73.03

## 2020-11-11 NOTE — Progress Notes (Signed)
See MD note for nursing evaluation. °

## 2020-11-12 ENCOUNTER — Inpatient Hospital Stay: Payer: Medicare HMO | Attending: Oncology

## 2020-11-12 ENCOUNTER — Inpatient Hospital Stay: Payer: Medicare HMO | Admitting: Oncology

## 2020-11-12 VITALS — BP 150/66 | HR 73 | Temp 97.7°F | Resp 18 | Ht 61.0 in | Wt 168.5 lb

## 2020-11-12 DIAGNOSIS — Z170 Estrogen receptor positive status [ER+]: Secondary | ICD-10-CM

## 2020-11-12 DIAGNOSIS — C50512 Malignant neoplasm of lower-outer quadrant of left female breast: Secondary | ICD-10-CM

## 2020-11-12 DIAGNOSIS — H540X33 Blindness right eye category 3, blindness left eye category 3: Secondary | ICD-10-CM

## 2020-11-12 LAB — CBC WITH DIFFERENTIAL/PLATELET
Abs Immature Granulocytes: 0.02 10*3/uL (ref 0.00–0.07)
Basophils Absolute: 0 10*3/uL (ref 0.0–0.1)
Basophils Relative: 0 %
Eosinophils Absolute: 0.2 10*3/uL (ref 0.0–0.5)
Eosinophils Relative: 3 %
HCT: 39.2 % (ref 36.0–46.0)
Hemoglobin: 13.4 g/dL (ref 12.0–15.0)
Immature Granulocytes: 0 %
Lymphocytes Relative: 27 %
Lymphs Abs: 1.6 10*3/uL (ref 0.7–4.0)
MCH: 29.8 pg (ref 26.0–34.0)
MCHC: 34.2 g/dL (ref 30.0–36.0)
MCV: 87.1 fL (ref 80.0–100.0)
Monocytes Absolute: 0.5 10*3/uL (ref 0.1–1.0)
Monocytes Relative: 9 %
Neutro Abs: 3.6 10*3/uL (ref 1.7–7.7)
Neutrophils Relative %: 61 %
Platelets: 233 10*3/uL (ref 150–400)
RBC: 4.5 MIL/uL (ref 3.87–5.11)
RDW: 12.9 % (ref 11.5–15.5)
WBC: 5.9 10*3/uL (ref 4.0–10.5)
nRBC: 0 % (ref 0.0–0.2)

## 2020-11-12 LAB — COMPREHENSIVE METABOLIC PANEL
ALT: 14 U/L (ref 0–44)
AST: 16 U/L (ref 15–41)
Albumin: 4.1 g/dL (ref 3.5–5.0)
Alkaline Phosphatase: 70 U/L (ref 38–126)
Anion gap: 9 (ref 5–15)
BUN: 12 mg/dL (ref 8–23)
CO2: 28 mmol/L (ref 22–32)
Calcium: 9.6 mg/dL (ref 8.9–10.3)
Chloride: 101 mmol/L (ref 98–111)
Creatinine, Ser: 0.74 mg/dL (ref 0.44–1.00)
GFR, Estimated: >60 mL/min (ref >60)
Glucose, Bld: 112 mg/dL — ABNORMAL HIGH (ref 70–99)
Potassium: 3.5 mmol/L (ref 3.5–5.1)
Sodium: 138 mmol/L (ref 135–145)
Total Bilirubin: 0.7 mg/dL (ref 0.3–1.2)
Total Protein: 7.3 g/dL (ref 6.5–8.1)

## 2020-11-12 MED ORDER — ANASTROZOLE 1 MG PO TABS: 1.0000 mg | ORAL_TABLET | Freq: Every day | ORAL | 4 refills | Status: DC

## 2020-11-12 NOTE — Progress Notes (Signed)
[place]  [phone] [phone]     ID: [name] DOB: 09-Aug-1950  MR#: [id number]  VXB#:[id number]  Patient Care Team: Leamon Arnt, MD as PCP - General (Family Medicine) Elsie Saas, MD as Consulting Physician (Orthopedic Surgery) Princess Bruins, MD as Consulting Physician (Obstetrics and Gynecology) Rockwell Germany, RN as Oncology Nurse Navigator Tressie Ellis, Paulette Blanch, RN as Oncology Nurse Navigator Stark Klein, MD as Consulting Physician (General Surgery) Beautifull Cisar, Virgie Dad, MD as Consulting Physician (Oncology) Gery Pray, MD as Consulting Physician (Radiation Oncology) Chauncey Cruel, MD OTHER MD:  CHIEF COMPLAINT: estrogen receptor positive breast cancer  CURRENT TREATMENT: Adjuvant radiation   INTERVAL HISTORY: Gayathri returns today for follow up of her estrogen receptor positive breast cancer. She was evaluated in the multidisciplinary breast cancer clinic on 09/09/2020.  She is accompanied by her daughter and an interpreter  Genetic testing was performed during breast clinic. Results were negative.  She underwent left lumpectomy on 10/15/2020 under Dr. Barry Dienes. Pathology from the procedure [phone]) showed: invasive ductal carcinoma, grade 2, 1.1 cm; ductal carcinoma in situ, intermediate grade, focally involving a papillary lesion; resection margins negative.  All five biopsied lymph nodes were negative for metastatic carcinoma (0/5).  Oncotype DX was obtained on the final surgical sample and the recurrence score of 15 predicts a risk of recurrence outside the breast over the next 9 years of 4%, if the patient's only systemic therapy is an antiestrogen for 5 years.  It also predicts no benefit from chemotherapy.   She was referred back to Dr. Sondra Come on 11/11/2020 to review radiation therapy. She is scheduled to receive treatment from 11/18/2020 through 12/17/2020.   REVIEW OF SYSTEMS: Teri did very well with  her surgery, with no significant pain bleeding fever or any other complications.  She has a little spot in the scar that she wanted me to look at.  She exercises by walking and also has a stationary bike that she uses regularly.  A detailed review of systems today was otherwise benign.   COVID 19 VACCINATION STATUS: fully vaccinated, Moderna x2 with [place] booster 02/2020 as of August 2022   HISTORY OF CURRENT ILLNESS: From the original intake note:  Kambrea Dugar had routine screening mammography on 08/06/2020 showing a possible abnormality in the left breast. She underwent left diagnostic mammography with tomography and left breast ultrasonography at [facility] on 08/28/2020 showing: breast density category C; 11 mm left breast mass at 6 o'clock, retroareolar; no left axillary lymphadenopathy.  Accordingly on 09/03/2020 she proceeded to biopsy of the left breast area in question. The pathology from this procedure ([id number]) showed: invasive ductal carcinoma, grade 1-2; ductal carcinoma in situ. Prognostic indicators significant for: estrogen receptor, >95% positive and progesterone receptor, >95% positive, both with strong staining intensity. Proliferation marker Ki67 at 15%. HER2 negative by immunohistochemistry (1+).  Cancer Staging Malignant neoplasm of lower-outer quadrant of left breast of female, estrogen receptor positive ([place]) Staging form: Breast, AJCC 8th Edition - Clinical: Stage IA (cT1c, cN0, cM0, G2, ER+, PR+, HER2-) - Signed by Chauncey Cruel, MD on 09/08/2020 Histologic grading system: 3 grade system  The patient's subsequent history is as detailed below.   PAST MEDICAL HISTORY: Past Medical History:  Diagnosis Date   Anxiety    Arthritis    Breast cancer ([place])    Colon polyps 8937   Diastolic dysfunction, left ventricle 06/26/2016   (ECHO 2018): Left ventricle: The cavity size was normal. Systolic function was  normal. The estimated ejection fraction was in the  range of 60% to 65%. Wall motion was normal; there were no regional wall motion abnormalities. Doppler parameters are consistent with abnormal left ventricular relaxation (grade 1 diastolic dysfunction).   Diverticulosis    Family history of brain cancer    Family history of breast cancer    Family history of pancreatic cancer    Family history of uterine cancer    GERD (gastroesophageal reflux disease)    watches what she eats   Headache    gets headaches when she eats sweets   Heart murmur    dx last yr.   had echo 05/2016   Hyperlipidemia    Hypertension    Mild mitral regurgitation 07/04/2016   Pneumonia    Pre-diabetes    Prediabetes 11/11/2020   Primary localized osteoarthritis of right knee 05/24/2017   Ureterovaginal fistula    Uterine fibroid 1994   Vaginal dysplasia     PAST SURGICAL HISTORY: Past Surgical History:  Procedure Laterality Date   BREAST CYST ASPIRATION Right    BREAST EXCISIONAL BIOPSY Right    BREAST LUMPECTOMY WITH RADIOACTIVE SEED AND SENTINEL LYMPH NODE BIOPSY Left 10/15/2020   Procedure: LEFT BREAST LUMPECTOMY WITH RADIOACTIVE SEED AND SENTINEL LYMPH NODE BIOPSY;  Surgeon: Stark Klein, MD;  Location: [facility];  Service: General;  Laterality: Left;   COLONOSCOPY  2010   COLONOSCOPY  2012   COLONOSCOPY  2016   CYSTOSCOPY     ESOPHAGOGASTRODUODENOSCOPY  2010   TOTAL ABDOMINAL HYSTERECTOMY  1994   TOTAL KNEE ARTHROPLASTY Right 06/05/2017   TOTAL KNEE ARTHROPLASTY Right 06/05/2017   Procedure: RIGHT TOTAL KNEE ARTHROPLASTY;  Surgeon: Elsie Saas, MD;  Location: [facility];  Service: Orthopedics;  Laterality: Right;   Ueterovaginal fistual repair     URETERAL EXPLORATION  04/16/1992   URETEROTOMY ON THE RIGHT, REPAIR LACERATION ON THE LEFT    FAMILY HISTORY: Family History  Problem Relation Age of Onset   Diabetes Mother    Hypertension Mother    Brain cancer Father        d. 84   Hypertension Sister    Diabetes Sister    Diabetes Brother     Breast cancer Paternal Aunt        dx >50   Uterine cancer Paternal Aunt        dx >50   Cancer Maternal Grandmother        unknown type, died in her 70s   Pancreatic cancer Cousin 75       maternal first cousin   Uterine cancer Cousin        dx 52s, maternal first cousin   Breast cancer Cousin 82       paternal first cousin   CAD Neg Hx    Stroke Neg Hx    Colon cancer Neg Hx    Colon polyps Neg Hx    Esophageal cancer Neg Hx    Stomach cancer Neg Hx    Rectal cancer Neg Hx    Her father died at age 30 from brain cancer. Her mother is 61 years old as of 08/2020. Linette has two brothers and two sisters. She reports breast cancer in a paternal aunt (over age 67), uterine cancer in a paternal aunt (>50) and a maternal cousin (48's), and pancreatic cancer in a maternal cousin (age 4).   GYNECOLOGIC HISTORY:  No LMP recorded. Patient has had a hysterectomy. Menarche: 70 years old Age at first  live birth: 70 years old [place] P 4 LMP 02/1992, with hysterectomy Contraceptive: never used HRT never used  Hysterectomy? Yes, 02/1992 BSO? Yes?   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 Husband Bland Span works in [employer], now part time. She lives at home with her husband. Their daughter Kristopher Oppenheim, age 55, is a Psychologist, sport and exercise at [employer] here in [place]. Son Peter Garter, age 60, works in transit at a train station in [place]. Son Mckinley Jewel, age 72, is a Tour manager here in [place]. Son Johnsie Cancel, age 32, is a Tour manager in [place]. She attends a [employer].    ADVANCED DIRECTIVES: not in place. In the absence of any documentation to the contrary, the patient's spouse is their HCPOA. She intends to name her son Mckinley Jewel as her HCPOA.   HEALTH MAINTENANCE: Social History   Tobacco Use   Smoking status: Never   Smokeless tobacco: Never  Vaping Use   Vaping Use: Never used  Substance Use  Topics   Alcohol use: No    Alcohol/week: 0.0 standard drinks    Comment: rare on holidays   Drug use: No     Colonoscopy: 01/2020 (Dr. Loletha Carrow), recall 2026  PAP: 05/2020, negative  Bone density: 03/2017, -0.1   No Known Allergies  Current Outpatient Medications  Medication Sig Dispense Refill   aspirin EC 81 MG tablet Take 1 tablet (81 mg total) by mouth daily. (Patient not taking: Reported on 11/11/2020)     hydrochlorothiazide (HYDRODIURIL) 25 MG tablet TAKE 1 TABLET (25 MG TOTAL) BY MOUTH DAILY. (Patient not taking: No sig reported) 90 tablet 1   hydroxypropyl methylcellulose / hypromellose (ISOPTO TEARS / GONIOVISC) 2.5 % ophthalmic solution Place 1 drop into both eyes 3 (three) times daily as needed for dry eyes.     losartan (COZAAR) 100 MG tablet Take 1 tablet (100 mg total) by mouth daily. (Patient not taking: No sig reported) 60 tablet 0   losartan-hydrochlorothiazide (HYZAAR) 100-25 MG tablet Take 1 tablet by mouth daily.     Multiple Vitamin (MULTIVITAMIN) tablet Take 1 tablet by mouth daily. (Patient not taking: Reported on 11/11/2020)     oxyCODONE (OXY IR/ROXICODONE) 5 MG immediate release tablet Take 1 tablet (5 mg total) by mouth every 6 (six) hours as needed for severe pain. (Patient not taking: Reported on 11/11/2020) 15 tablet 0   simvastatin (ZOCOR) 20 MG tablet Take 1 tablet (20 mg total) by mouth at bedtime. 90 tablet 3   No current facility-administered medications for this visit.    OBJECTIVE: Spanish speaker who appears stated age  70:   11/12/20 0958  BP: (!) 150/66  Pulse: 73  Resp: 18  Temp: 97.7 F (36.5 C)  SpO2: 98%      Body mass index is 31.84 kg/m.   Wt Readings from Last 3 Encounters:  11/12/20 168 lb 8 oz (76.4 kg)  11/11/20 167 lb 6.4 oz (75.9 kg)  10/15/20 168 lb 10.4 oz (76.5 kg)      ECOG FS:1 - Symptomatic but completely ambulatory  Sclerae unicteric, EOMs intact Wearing a mask No cervical or supraclavicular adenopathy Lungs  no rales or rhonchi Heart regular rate and rhythm Abd soft, nontender, positive bowel sounds MSK no focal spinal tenderness, no upper extremity lymphedema Neuro: nonfocal, well oriented, appropriate affect Breasts: The right breast is benign.  The left breast is status post lumpectomy.  The cosmetic result is  good.  The incisions are healing nicely without dehiscence erythema or swelling.  The "spot" she wanted me to look at it is on the lateral aspect of the breast incision and it looks like a very small eschar.  Both axillae are benign   LAB RESULTS:  CMP     Component Value Date/Time   NA 135 10/12/2020 1230   K 3.0 (L) 10/12/2020 1230   CL 93 (L) 10/12/2020 1230   CO2 31 10/12/2020 1230   GLUCOSE 110 (H) 10/12/2020 1230   BUN 8 10/12/2020 1230   CREATININE 0.57 10/12/2020 1230   CREATININE 0.69 09/09/2020 0812   CREATININE 0.57 06/01/2016 1018   CALCIUM 9.3 10/12/2020 1230   CALCIUM 9.0 09/11/2012 0000   PROT 7.4 09/09/2020 0812   ALBUMIN 4.1 09/09/2020 0812   AST 17 09/09/2020 0812   ALT 16 09/09/2020 0812   ALKPHOS 87 09/09/2020 0812   ALKPHOS 78 09/11/2012 0000   BILITOT 0.4 09/09/2020 0812   GFRNONAA >60 10/12/2020 1230   GFRNONAA >60 09/09/2020 0812   GFRAA >60 06/06/2017 0404    No results found for: TOTALPROTELP, ALBUMINELP, A1GS, A2GS, BETS, BETA2SER, GAMS, MSPIKE, SPEI  Lab Results  Component Value Date   WBC 5.9 11/12/2020   NEUTROABS 3.6 11/12/2020   HGB 13.4 11/12/2020   HCT 39.2 11/12/2020   MCV 87.1 11/12/2020   PLT 233 11/12/2020    No results found for: LABCA2  No components found for: TKWIOX735  No results for input(s): INR in the last 168 hours.  No results found for: LABCA2  No results found for: HGD924  No results found for: QAS341  No results found for: DQQ229  No results found for: CA2729  No components found for: HGQUANT  No results found for: CEA1 / No results found for: CEA1   No results found for: AFPTUMOR  No results  found for: CHROMOGRNA  No results found for: KPAFRELGTCHN, LAMBDASER, KAPLAMBRATIO (kappa/lambda light chains)  No results found for: HGBA, HGBA2QUANT, HGBFQUANT, HGBSQUAN (Hemoglobinopathy evaluation)   No results found for: LDH  No results found for: IRON, TIBC, IRONPCTSAT (Iron and TIBC)  No results found for: FERRITIN  Urinalysis    Component Value Date/Time   COLORURINE YELLOW 09/27/2019 1040   APPEARANCEUR CLEAR 09/27/2019 1040   LABSPEC 1.015 09/27/2019 1040   PHURINE 8.0 09/27/2019 1040   GLUCOSEU NEGATIVE 09/27/2019 1040   HGBUR NEGATIVE 09/27/2019 1040   BILIRUBINUR NEGATIVE 09/27/2019 1040   BILIRUBINUR Negative 03/14/2017 0944   KETONESUR NEGATIVE 09/27/2019 1040   PROTEINUR NEGATIVE 01/09/2019 1117   UROBILINOGEN 0.2 09/27/2019 1040   NITRITE NEGATIVE 09/27/2019 1040   LEUKOCYTESUR NEGATIVE 09/27/2019 1040    STUDIES: NM Sentinel Node Inj-No Rpt (Breast)  Result Date: 10/15/2020 Sulfur Colloid was injected by the Nuclear Medicine Technologist for sentinel lymph node localization.   MM Breast Surgical Specimen  Result Date: 10/15/2020 CLINICAL DATA:  Evaluate surgical specimen following lumpectomy for LEFT breast cancer. EXAM: SPECIMEN RADIOGRAPH OF THE LEFT BREAST COMPARISON:  Previous exam(s). FINDINGS: Status post excision of the LEFT breast. The radioactive seed and RIBBON biopsy marker clip are present, completely intact, and were marked for pathology. IMPRESSION: Specimen radiograph of the LEFT breast. Electronically Signed   By: Margarette Canada M.D.   On: 10/15/2020 13:30  [place] LT RADIOACTIVE SEED LOC  Result Date: 10/14/2020 CLINICAL DATA:  Patient presents for seed localization prior to LEFT lumpectomy for recently diagnosed invasive ductal carcinoma and ductal carcinoma in situ. EXAM: ULTRASOUND  GUIDED RADIOACTIVE SEED LOCALIZATION OF THE LEFT BREAST COMPARISON:  Previous exam(s). FINDINGS: Patient presents for radioactive seed localization prior to  lumpectomy. I met with the patient and we discussed the procedure of seed localization including benefits and alternatives. We discussed the high likelihood of a successful procedure. We discussed the risks of the procedure including infection, bleeding, tissue injury and further surgery. We discussed the low dose of radioactivity involved in the procedure. Informed, written consent was given. The usual time-out protocol was performed immediately prior to the procedure. Using ultrasound guidance, sterile technique, 1% lidocaine and an I-125 radioactive seed, the mass in the 6 o'clock retroareolar region of the LEFT breast was localized using a LATERAL to MEDIAL approach. The follow-up mammogram images confirm the seed in the expected location and were marked for Dr. Barry Dienes. Follow-up survey of the patient confirms presence of the radioactive seed. Order number of I-125 seed:  [id number]. Total activity:  8.676 millicuries reference Date: 10/07/2020 The patient tolerated the procedure well and was released from the [facility]. She was given instructions regarding seed removal. IMPRESSION: Radioactive seed localization left breast. No apparent complications. Electronically Signed   By: Nolon Nations M.D.   On: 10/14/2020 15:58  MM CLIP PLACEMENT LEFT  Result Date: 10/14/2020 CLINICAL DATA:  Status post ultrasound-guided placement of radioactive seed in the LEFT breast. EXAM: 3D DIAGNOSTIC LEFT MAMMOGRAM POST ULTRASOUND GUIDED SEED PLACEMENT COMPARISON:  Previous exam(s). FINDINGS: 3D Mammographic images were obtained following ultrasound guided placement of radioactive seed in the 6 o'clock location of the LEFT breast. The radioactive seed is within the mass in the 6 o'clock location, adjacent to previously placed ribbon shaped clip. IMPRESSION: Appropriate positioning of the radioactive seed in the LOWER central LEFT breast. Final Assessment: Post Procedure Mammograms for Marker Placement Electronically  Signed   By: Nolon Nations M.D.   On: 10/14/2020 15:59    ELIGIBLE FOR AVAILABLE RESEARCH PROTOCOL: No  ASSESSMENT: 70 y.o. [place] woman s/p Left breast lower outer quadrant biopsy 09/03/2020 for a clinical T1c N0, stage IA invasive ductal carcinoma, grade I-II, estrogen and progesterone receptor positive, HER2 not amplified, with an Mib-1 of 15%  (1) genetics testing through the [facility] BRCAplus panel (report date 09/15/2020) or the CancerNext-Expanded + RNAinsight panel (report date 09/19/2020) found no deleterious mutations in ATM, BRCA1, BRCA2, CDH1, CHEK2, PALB2, PTEN, and TP53. The CancerNext-Expanded + RNAinsight gene panel offered by [employer] and includes sequencing and rearrangement analysis for the following 77 genes: AIP, ALK, APC, ATM, AXIN2, BAP1, BARD1, BLM, BMPR1A, BRCA1, BRCA2, BRIP1, CDC73, CDH1, CDK4, CDKN1B, CDKN2A, CHEK2, CTNNA1, DICER1, FANCC, FH, FLCN, GALNT12, KIF1B, LZTR1, MAX, MEN1, MET, MLH1, MSH2, MSH3, MSH6, MUTYH, NBN, NF1, NF2, NTHL1, PALB2, PHOX2B, PMS2, POT1, PRKAR1A, PTCH1, PTEN, RAD51C, RAD51D, RB1, RECQL, RET, SDHA, SDHAF2, SDHB, SDHC, SDHD, SMAD4, SMARCA4, SMARCB1, SMARCE1, STK11, SUFU, TMEM127, TP53, TSC1, TSC2, VHL and XRCC2 (sequencing and deletion/duplication); EGFR, EGLN1, HOXB13, KIT, MITF, PDGFRA, POLD1 and POLE (sequencing only); EPCAM and GREM1 (deletion/duplication only). RNA data is routinely analyzed for use in variant interpretation for all genes.  (2) status post left lumpectomy and sentinel lymph node sampling 10/15/2020 for a pT1c pN0, stage IA invasive ductal carcinoma, grade 2, with negative margins (A) a total of 5 left axillary lymph nodes were removed  (3) Oncotype score of 15 predicts a risk of recurrence outside the breast in the next 9 years of 4% if the patient's only systemic therapy is antiestrogens for 5 years.  It also predicts  no benefit from chemotherapy  (4) adjuvant radiation planned 11/18/2020 through 12/17/2020.  (5) to  start anastrozole 12/26/2020 (A) bone density at [facility] 04/18/2017 was normal   PLAN: Abella did very well with her surgery and now is ready to proceed with adjuvant radiation.  Today we reviewed her surgical results and her Oncotype so she has a good understanding of why she does not need chemotherapy and still has a very good prognosis.  We discussed the need for 5 years of antiestrogen.  She understands that anastrozole and the aromatase inhibitors in general work by blocking estrogen production. Accordingly vaginal dryness, decrease in bone density, and of course hot flashes can result. The aromatase inhibitors can also negatively affect the cholesterol profile, although that is a minor effect. One out of 5 women on aromatase inhibitors we will feel "old and achy". This arthralgia/myalgia syndrome, which resembles fibromyalgia clinically, does resolve with stopping the medications. Accordingly this is not a reason to not try an aromatase inhibitor but it is a frequent reason to stop it (in other words 20% of women will not be able to tolerate these medications).  I have put in the prescription so she will start anastrozole October 1.  She will see me in about 6 weeks later to make sure she is tolerating it well.  If she is we will broaden her follow-up interval from that point.  Total encounter time 67mnutes.*Sarajane JewsC. Nicoya Friel, MD 11/12/2020 10:18 AM [facility] [id number]. [phone]   Fax. [phone]  This document serves as a record of services personally performed by GLurline Del MD. It was created on his behalf by KWilburn Mylar a trained medical scribe. The creation of this record is based on the scribe's personal observations and the provider's statements to them.   I, GLurline DelMD, have reviewed the above documentation for accuracy and completeness,  and I agree with the above.   *Total Encounter Time as defined by the Centers for Medicare and Medicaid Services includes, in addition to the face-to-face time of a patient visit (documented in the note above) non-face-to-face time: obtaining and reviewing outside history, ordering and reviewing medications, tests or procedures, care coordination (communications with other health care professionals or caregivers) and documentation in the medical record.

## 2020-11-13 ENCOUNTER — Telehealth: Payer: Self-pay | Admitting: Oncology

## 2020-11-13 ENCOUNTER — Ambulatory Visit: Payer: Medicare HMO | Admitting: Radiation Oncology

## 2020-11-13 NOTE — Telephone Encounter (Signed)
Scheduled appointment per 08/18 los. Left message.

## 2020-11-16 ENCOUNTER — Other Ambulatory Visit: Payer: Self-pay

## 2020-11-16 ENCOUNTER — Encounter: Payer: Self-pay | Admitting: Physical Therapy

## 2020-11-16 ENCOUNTER — Ambulatory Visit: Payer: Medicare HMO | Attending: General Surgery | Admitting: Physical Therapy

## 2020-11-16 DIAGNOSIS — M25612 Stiffness of left shoulder, not elsewhere classified: Secondary | ICD-10-CM | POA: Insufficient documentation

## 2020-11-16 DIAGNOSIS — C50512 Malignant neoplasm of lower-outer quadrant of left female breast: Secondary | ICD-10-CM | POA: Insufficient documentation

## 2020-11-16 DIAGNOSIS — R293 Abnormal posture: Secondary | ICD-10-CM | POA: Diagnosis not present

## 2020-11-16 DIAGNOSIS — Z170 Estrogen receptor positive status [ER+]: Secondary | ICD-10-CM | POA: Diagnosis not present

## 2020-11-16 DIAGNOSIS — Z483 Aftercare following surgery for neoplasm: Secondary | ICD-10-CM | POA: Diagnosis not present

## 2020-11-16 NOTE — Patient Instructions (Addendum)
Closed Chain: Shoulder Abduction / Adduction - on Wall    One hand on wall, step to side and return. Stepping causes shoulder to abduct and adduct. Step __5_ times, holding 5 seconds, 2 times per day.  http://ss.exer.us/267   Copyright  VHI. All rights reserved.  Closed Chain: Shoulder Flexion / Extension - on Wall    Hands on wall, step backward. Return. Stepping causes shoulder flexion and extension Do _5__ times, holding 5 seconds, 2 times per day.  http://ss.exer.us/265   Copyright  VHI. All rights reserved.               [facility]         [address], [place] [address]         [phone]         Annia Friendly, PT, CLT   After Breast Cancer Class It is recommended you attend the ABC class to be educated on lymphedema risk reduction. This class is free of charge and lasts for 1 hour. It is a 1-time class.  I'm giving you the information in writing instead of attending the class since it is in English  Scar massage You can begin doing scar massage a few minutes each day like I showed you - stretch the skin and then rub some coconut oil on the incisions.   Compression garment You need to get a sports bra to help reduce your swelling - get one that zips up the front.   Home exercise Program Continue doing the exercises I gave you but add the 2 listed above.   Follow up PT: It is recommended you return every 3 months for the first 3 years following surgery to be assessed on the SOZO machine for an L-Dex score. This helps prevent clinically significant lymphedema in 95% of patients. These follow up screens are 10 minute appointments that you are not billed for. WE ARE SCHEDULED TO MOVE TO [place] December 28, 2020. APPOINTMENTS FOR SOZO SCREENS AFTER 12/28/2020 WILL BE LOCATED AT [facility] AT [address] [place] [address]. Please call us to confirm we have moved if your appointment is scheduled  after October 3rd, 2022. The phone number is [phone]. You are scheduled for October 24th at 9:50 at our new address (see above).

## 2020-11-16 NOTE — Therapy (Signed)
[place], [place], [address] Phone: [phone]   Fax:  [phone]  Physical Therapy Treatment  Patient Details  Name: Debra Simon MRN: [id number] Date of Birth: [birth year] Referring Provider (PT): Dr. Stark Klein   Encounter Date: 11/16/2020   PT End of Session - 11/16/20 1344     Visit Number 2    Number of Visits 2    PT Start Time 1300    PT Stop Time 1344    PT Time Calculation (min) 44 min    Activity Tolerance Patient tolerated treatment well    Behavior During Therapy [employer] for tasks assessed/performed             Past Medical History:  Diagnosis Date   Anxiety    Arthritis    Breast cancer ([place])    Colon polyps 8144   Diastolic dysfunction, left ventricle 06/26/2016   (ECHO 2018): Left ventricle: The cavity size was normal. Systolic function was normal. The estimated ejection fraction was in the range of 60% to 65%. Wall motion was normal; there were no regional wall motion abnormalities. Doppler parameters are consistent with abnormal left ventricular relaxation (grade 1 diastolic dysfunction).   Diverticulosis    Family history of brain cancer    Family history of breast cancer    Family history of pancreatic cancer    Family history of uterine cancer    GERD (gastroesophageal reflux disease)    watches what she eats   Headache    gets headaches when she eats sweets   Heart murmur    dx last yr.   had echo 05/2016   Hyperlipidemia    Hypertension    Mild mitral regurgitation 07/04/2016   Pneumonia    Pre-diabetes    Prediabetes 11/11/2020   Primary localized osteoarthritis of right knee 05/24/2017   Ureterovaginal fistula    Uterine fibroid 1994   Vaginal dysplasia     Past Surgical History:  Procedure Laterality Date   BREAST CYST ASPIRATION Right    BREAST EXCISIONAL BIOPSY Right    BREAST LUMPECTOMY WITH RADIOACTIVE SEED AND SENTINEL LYMPH NODE BIOPSY Left  10/15/2020   Procedure: LEFT BREAST LUMPECTOMY WITH RADIOACTIVE SEED AND SENTINEL LYMPH NODE BIOPSY;  Surgeon: Stark Klein, MD;  Location: [facility];  Service: General;  Laterality: Left;   COLONOSCOPY  2010   COLONOSCOPY  2012   COLONOSCOPY  2016   CYSTOSCOPY     ESOPHAGOGASTRODUODENOSCOPY  2010   TOTAL ABDOMINAL HYSTERECTOMY  1994   TOTAL KNEE ARTHROPLASTY Right 06/05/2017   TOTAL KNEE ARTHROPLASTY Right 06/05/2017   Procedure: RIGHT TOTAL KNEE ARTHROPLASTY;  Surgeon: Elsie Saas, MD;  Location: [facility];  Service: Orthopedics;  Laterality: Right;   Ueterovaginal fistual repair     URETERAL EXPLORATION  04/16/1992   URETEROTOMY ON THE RIGHT, REPAIR LACERATION ON THE LEFT    There were no vitals filed for this visit.   Subjective Assessment - 11/16/20 1304     Subjective Patient underwent a left lumpectomy and sentinel node biopsy on 10/15/2020 with 5 negative axillary nodes removed. Her Oncotype score was low so need for chemotherapy but she will start radiation 11/18/2020.    Pertinent History Patient was diagnosed on 08/28/2020 with left grade I-II invasive ductal carcinoma breast cancer. She had a left lumpectomy and sentinel node biopsy on 10/15/2020 with 5 negative axillary nodes removed. It is ER/PR positive and HER2 negative with a Ki67 of 15%.  Patient Stated Goals Get my arm better    Currently in Pain? Yes    Pain Score 1     Pain Location Arm    Pain Orientation Left;Upper;Medial    Pain Descriptors / Indicators Burning    Pain Type Surgical pain    Pain Onset More than a month ago    Pain Frequency Intermittent    Aggravating Factors  Stretching arm    Pain Relieving Factors Rest                [facility] PT Assessment - 11/16/20 0001       Assessment   Medical Diagnosis s/p left lumpectomy and SLNB    Referring Provider (PT) Dr. Stark Klein    Onset Date/Surgical Date 10/15/20    Hand Dominance Right    Prior Therapy Baselines      Precautions   Precautions  Other (comment)    Precaution Comments recent surgery and left arm lymphedema risk      Restrictions   Weight Bearing Restrictions No      Balance Screen   Has the patient fallen in the past 6 months No    Has the patient had a decrease in activity level because of a fear of falling?  No    Is the patient reluctant to leave their home because of a fear of falling?  No      Home Ecologist residence    Living Arrangements Spouse/significant other    Available Help at Discharge Family      Prior Function   Level of [place] Retired    Leisure She is walking 6 days per week for 30 minutes      Cognition   Overall Cognitive Status Within Functional Limits for tasks assessed      Observation/Other Assessments   Observations Left breast and axillary incisions are well healed with mild edema present just inferior to her left axilla - which would be helped with a sports bra.      Posture/Postural Control   Posture/Postural Control Postural limitations    Postural Limitations Forward head;Rounded Shoulders      ROM / Strength   AROM / PROM / Strength AROM      AROM   AROM Assessment Site Shoulder    Right/Left Shoulder Left    Left Shoulder Extension 50 Degrees    Left Shoulder Flexion 148 Degrees    Left Shoulder ABduction 151 Degrees    Left Shoulder Internal Rotation 76 Degrees    Left Shoulder External Rotation 83 Degrees      Strength   Overall Strength Within functional limits for tasks performed               LYMPHEDEMA/ONCOLOGY QUESTIONNAIRE - 11/16/20 0001       Type   Cancer Type Left breast cancer      Surgeries   Lumpectomy Date 10/15/20    Sentinel Lymph Node Biopsy Date 10/15/20    Number Lymph Nodes Removed 5      Treatment   Active Chemotherapy Treatment No    Past Chemotherapy Treatment No    Active Radiation Treatment No    Past Radiation Treatment No    Current Hormone Treatment No     Past Hormone Therapy No      What other symptoms do you have   Are you Having Heaviness or Tightness No    Are you having  Pain Yes    Are you having pitting edema No    Is it Hard or Difficult finding clothes that fit No    Do you have infections No    Is there Decreased scar mobility No    [place] Sign No      Lymphedema Assessments   Lymphedema Assessments Upper extremities      Right Upper Extremity Lymphedema   10 cm Proximal to Olecranon Process 34.1 cm    Olecranon Process 25.9 cm    10 cm Proximal to Ulnar Styloid Process 22 cm    Just Proximal to Ulnar Styloid Process 15.8 cm    Across Hand at [employer] 17.7 cm    At [place] of 2nd Digit 6.2 cm      Left Upper Extremity Lymphedema   10 cm Proximal to Olecranon Process 32 cm    Olecranon Process 25.7 cm    10 cm Proximal to Ulnar Styloid Process 22.8 cm    Just Proximal to Ulnar Styloid Process 16.5 cm    Across Hand at [employer] 17.8 cm    At [place] of 2nd Digit 6 cm                Quick Dash - 11/16/20 0001     Open a tight or new jar Mild difficulty    Do heavy household chores (wash walls, wash floors) Moderate difficulty    Carry a shopping bag or briefcase Moderate difficulty    Wash your back Mild difficulty    Use a knife to cut food Severe difficulty    Recreational activities in which you take some force or impact through your arm, shoulder, or hand (golf, hammering, tennis) Moderate difficulty    During the past week, to what extent has your arm, shoulder or hand problem interfered with your normal social activities with family, friends, neighbors, or groups? Modererately    During the past week, to what extent has your arm, shoulder or hand problem limited your work or other regular daily activities Modererately    Arm, shoulder, or hand pain. None    Tingling (pins and needles) in your arm, shoulder, or hand Mild    Difficulty Sleeping No difficulty    DASH Score 36.36 %                             PT Education - 11/16/20 1332     Education Details Aftercare; home exericses; scar massage    Person(s) Educated Patient    Methods Explanation;Demonstration;Handout    Comprehension Returned demonstration;Verbalized understanding                 PT Long Term Goals - 11/16/20 1347       PT LONG TERM GOAL #1   Title Patient will demonstrate she has regained full shoulder ROM and function post operatively compared to baselines.    Time 8    Period Weeks    Status Partially Met                   Plan - 11/16/20 1344     Clinical Impression Statement Patient is doing very well s/p left lumpectomy and sentinel node biopsy on 10/15/2020. She had 5 negative nodes removed. She was accompanied by her interpreter today. She has regained most of her shoulder ROM (lacking mildly with left shoulder flexion and abduction), shows no signs of lymphedema,  and her incisions have healed well. She has mild edema present in her left axilla which would be helped with a sports bra. Otherwise, she has no PT needs. Lymphedema risk reduction information was issued in Spanish today.    PT Treatment/Interventions ADLs/Self Care Home Management;Therapeutic exercise;Patient/family education    PT Next Visit Plan D/C    PT Home Exercise Plan Closed chain shoulder stretches    Consulted and Agree with Plan of Care Patient             Patient will benefit from skilled therapeutic intervention in order to improve the following deficits and impairments:  Postural dysfunction, Decreased range of motion, Impaired UE functional use, Pain, Decreased knowledge of precautions  Visit Diagnosis: Malignant neoplasm of lower-outer quadrant of left breast of female, estrogen receptor positive ([place])  Abnormal posture  Aftercare following surgery for neoplasm  Stiffness of left shoulder, not elsewhere classified     Problem List Patient Active Problem List    Diagnosis Date Noted   Prediabetes 11/11/2020   Genetic testing 09/16/2020   Family history of breast cancer 09/10/2020   Family history of brain cancer 09/10/2020   Family history of uterine cancer 09/10/2020   Family history of pancreatic cancer 09/10/2020   Blindness right eye category 3, blindness left eye category 3 09/09/2020   Malignant neoplasm of lower-outer quadrant of left breast of female, estrogen receptor positive ([place]) 09/07/2020   Varicose veins of both lower extremities with pain 61/60/7371   Umbilical hernia without obstruction and without gangrene 07/08/2020   History of lacunar cerebrovascular accident 09/27/2019   Serrated adenoma of colon 09/27/2019   Bilateral primary osteoarthritis of knee 06/05/2017   Diverticulosis    History of vaginal dysplasia 08/31/2016   Mixed hyperlipidemia 06/02/2016   Sensorineural hearing loss (SNHL), bilateral 07/07/2015   Tinnitus of both ears 07/07/2015   Rectocele 05/30/2014   Essential hypertension 07/31/2013   PHYSICAL THERAPY DISCHARGE SUMMARY  Visits from Start of Care: 2  Current functional level related to goals / functional outcomes: Slightly limited with left shoulder flexion and abduction; see above for objective measurements.   Remaining deficits: Mild edema left axilla; slightly limited shoulder ROM   Education / Equipment: Lymphedema education; HEP   Patient agrees to discharge. Patient goals were partially met. Patient is being discharged due to being pleased with the current functional level.  Annia Friendly, [place] 11/16/20 1:49 PM   [place], [place], [address] Phone: [phone]   Fax:  [phone]  Name: Kadi Hession MRN: [id number] Date of Birth: 22-Aug-1950

## 2020-11-17 DIAGNOSIS — C50512 Malignant neoplasm of lower-outer quadrant of left female breast: Secondary | ICD-10-CM | POA: Diagnosis not present

## 2020-11-17 DIAGNOSIS — C50412 Malignant neoplasm of upper-outer quadrant of left female breast: Secondary | ICD-10-CM | POA: Diagnosis not present

## 2020-11-17 DIAGNOSIS — Z170 Estrogen receptor positive status [ER+]: Secondary | ICD-10-CM | POA: Diagnosis not present

## 2020-11-17 DIAGNOSIS — Z510 Encounter for antineoplastic radiation therapy: Secondary | ICD-10-CM | POA: Diagnosis not present

## 2020-11-18 ENCOUNTER — Ambulatory Visit
Admission: RE | Admit: 2020-11-18 | Discharge: 2020-11-18 | Disposition: A | Discharge status: Home / Self Care | Payer: Medicare HMO | Source: Ambulatory Visit | Attending: Radiation Oncology | Admitting: Radiation Oncology

## 2020-11-18 ENCOUNTER — Encounter: Payer: Self-pay | Admitting: *Deleted

## 2020-11-18 DIAGNOSIS — Z510 Encounter for antineoplastic radiation therapy: Secondary | ICD-10-CM | POA: Diagnosis not present

## 2020-11-18 DIAGNOSIS — Z170 Estrogen receptor positive status [ER+]: Secondary | ICD-10-CM

## 2020-11-18 DIAGNOSIS — C50412 Malignant neoplasm of upper-outer quadrant of left female breast: Secondary | ICD-10-CM | POA: Diagnosis not present

## 2020-11-18 DIAGNOSIS — C50512 Malignant neoplasm of lower-outer quadrant of left female breast: Secondary | ICD-10-CM

## 2020-11-19 ENCOUNTER — Ambulatory Visit
Admission: RE | Admit: 2020-11-19 | Discharge: 2020-11-19 | Disposition: A | Discharge status: Home / Self Care | Payer: Medicare HMO | Source: Ambulatory Visit | Attending: Radiation Oncology | Admitting: Radiation Oncology

## 2020-11-19 DIAGNOSIS — Z170 Estrogen receptor positive status [ER+]: Secondary | ICD-10-CM | POA: Diagnosis not present

## 2020-11-19 DIAGNOSIS — C50412 Malignant neoplasm of upper-outer quadrant of left female breast: Secondary | ICD-10-CM | POA: Diagnosis not present

## 2020-11-19 DIAGNOSIS — C50512 Malignant neoplasm of lower-outer quadrant of left female breast: Secondary | ICD-10-CM | POA: Diagnosis not present

## 2020-11-19 DIAGNOSIS — Z510 Encounter for antineoplastic radiation therapy: Secondary | ICD-10-CM | POA: Diagnosis not present

## 2020-11-20 ENCOUNTER — Ambulatory Visit
Admission: RE | Admit: 2020-11-20 | Discharge: 2020-11-20 | Disposition: A | Discharge status: Home / Self Care | Payer: Medicare HMO | Source: Ambulatory Visit | Attending: Radiation Oncology | Admitting: Radiation Oncology

## 2020-11-20 ENCOUNTER — Other Ambulatory Visit: Payer: Self-pay

## 2020-11-20 DIAGNOSIS — Z170 Estrogen receptor positive status [ER+]: Secondary | ICD-10-CM | POA: Diagnosis not present

## 2020-11-20 DIAGNOSIS — C50512 Malignant neoplasm of lower-outer quadrant of left female breast: Secondary | ICD-10-CM | POA: Diagnosis not present

## 2020-11-20 DIAGNOSIS — C50412 Malignant neoplasm of upper-outer quadrant of left female breast: Secondary | ICD-10-CM | POA: Diagnosis not present

## 2020-11-20 DIAGNOSIS — Z510 Encounter for antineoplastic radiation therapy: Secondary | ICD-10-CM | POA: Diagnosis not present

## 2020-11-23 ENCOUNTER — Other Ambulatory Visit: Payer: Self-pay

## 2020-11-23 ENCOUNTER — Ambulatory Visit
Admission: RE | Admit: 2020-11-23 | Discharge: 2020-11-23 | Disposition: A | Discharge status: Home / Self Care | Payer: Medicare HMO | Source: Ambulatory Visit | Attending: Radiation Oncology | Admitting: Radiation Oncology

## 2020-11-23 DIAGNOSIS — C50512 Malignant neoplasm of lower-outer quadrant of left female breast: Secondary | ICD-10-CM | POA: Diagnosis not present

## 2020-11-23 DIAGNOSIS — C50412 Malignant neoplasm of upper-outer quadrant of left female breast: Secondary | ICD-10-CM | POA: Diagnosis not present

## 2020-11-23 DIAGNOSIS — Z510 Encounter for antineoplastic radiation therapy: Secondary | ICD-10-CM | POA: Diagnosis not present

## 2020-11-23 DIAGNOSIS — Z170 Estrogen receptor positive status [ER+]: Secondary | ICD-10-CM | POA: Diagnosis not present

## 2020-11-24 ENCOUNTER — Ambulatory Visit
Admission: RE | Admit: 2020-11-24 | Discharge: 2020-11-24 | Disposition: A | Discharge status: Home / Self Care | Payer: Medicare HMO | Source: Ambulatory Visit | Attending: Radiation Oncology | Admitting: Radiation Oncology

## 2020-11-24 DIAGNOSIS — Z170 Estrogen receptor positive status [ER+]: Secondary | ICD-10-CM | POA: Diagnosis not present

## 2020-11-24 DIAGNOSIS — C50412 Malignant neoplasm of upper-outer quadrant of left female breast: Secondary | ICD-10-CM | POA: Diagnosis not present

## 2020-11-24 DIAGNOSIS — Z510 Encounter for antineoplastic radiation therapy: Secondary | ICD-10-CM | POA: Diagnosis not present

## 2020-11-24 DIAGNOSIS — C50512 Malignant neoplasm of lower-outer quadrant of left female breast: Secondary | ICD-10-CM | POA: Diagnosis not present

## 2020-11-24 MED ORDER — RADIAPLEXRX EX GEL
Freq: Once | CUTANEOUS | Status: AC
Start: 2020-11-24 — End: 2020-11-24

## 2020-11-24 MED ORDER — ALRA NON-METALLIC DEODORANT (RAD-ONC)
1.0000 "application " | Freq: Once | TOPICAL | Status: AC
  Administered 2020-11-24: 1 via TOPICAL

## 2020-11-24 NOTE — Progress Notes (Signed)
Pt here for patient teaching.    Pt given Radiation and You booklet, skin care instructions, Alra deodorant, and Radiaplex gel.    Reviewed areas of pertinence such as fatigue, hair loss, skin changes, breast tenderness, and breast swelling .   Pt able to give teach back of to pat skin, use unscented/gentle soap, and use baby wipes,apply Radiaplex bid, avoid applying anything to skin within 4 hours of treatment, avoid wearing an under wire bra, and to use an electric razor if they must shave.   Pt verbalizes understanding-of information given and will contact nursing with any questions or concerns.    Http://rtanswers.org/treatmentinformation/whattoexpect/index

## 2020-11-25 ENCOUNTER — Other Ambulatory Visit: Payer: Self-pay

## 2020-11-25 ENCOUNTER — Ambulatory Visit
Admission: RE | Admit: 2020-11-25 | Discharge: 2020-11-25 | Disposition: A | Discharge status: Home / Self Care | Payer: Medicare HMO | Source: Ambulatory Visit | Attending: Radiation Oncology | Admitting: Radiation Oncology

## 2020-11-25 DIAGNOSIS — C50512 Malignant neoplasm of lower-outer quadrant of left female breast: Secondary | ICD-10-CM | POA: Diagnosis not present

## 2020-11-25 DIAGNOSIS — C50412 Malignant neoplasm of upper-outer quadrant of left female breast: Secondary | ICD-10-CM | POA: Diagnosis not present

## 2020-11-25 DIAGNOSIS — Z170 Estrogen receptor positive status [ER+]: Secondary | ICD-10-CM | POA: Diagnosis not present

## 2020-11-25 DIAGNOSIS — Z510 Encounter for antineoplastic radiation therapy: Secondary | ICD-10-CM | POA: Diagnosis not present

## 2020-11-26 ENCOUNTER — Ambulatory Visit
Admission: RE | Admit: 2020-11-26 | Discharge: 2020-11-26 | Disposition: A | Discharge status: Home / Self Care | Payer: Medicare HMO | Source: Ambulatory Visit | Attending: Radiation Oncology | Admitting: Radiation Oncology

## 2020-11-26 DIAGNOSIS — C50512 Malignant neoplasm of lower-outer quadrant of left female breast: Secondary | ICD-10-CM | POA: Diagnosis not present

## 2020-11-26 DIAGNOSIS — C50412 Malignant neoplasm of upper-outer quadrant of left female breast: Secondary | ICD-10-CM | POA: Diagnosis not present

## 2020-11-26 DIAGNOSIS — Z170 Estrogen receptor positive status [ER+]: Secondary | ICD-10-CM | POA: Insufficient documentation

## 2020-11-26 DIAGNOSIS — Z510 Encounter for antineoplastic radiation therapy: Secondary | ICD-10-CM | POA: Diagnosis not present

## 2020-11-27 ENCOUNTER — Other Ambulatory Visit: Payer: Self-pay

## 2020-11-27 ENCOUNTER — Ambulatory Visit
Admission: RE | Admit: 2020-11-27 | Discharge: 2020-11-27 | Disposition: A | Discharge status: Home / Self Care | Payer: Medicare HMO | Source: Ambulatory Visit | Attending: Radiation Oncology | Admitting: Radiation Oncology

## 2020-11-27 DIAGNOSIS — Z510 Encounter for antineoplastic radiation therapy: Secondary | ICD-10-CM | POA: Diagnosis not present

## 2020-11-27 DIAGNOSIS — C50512 Malignant neoplasm of lower-outer quadrant of left female breast: Secondary | ICD-10-CM | POA: Diagnosis not present

## 2020-11-27 DIAGNOSIS — C50412 Malignant neoplasm of upper-outer quadrant of left female breast: Secondary | ICD-10-CM | POA: Diagnosis not present

## 2020-11-27 DIAGNOSIS — Z170 Estrogen receptor positive status [ER+]: Secondary | ICD-10-CM | POA: Diagnosis not present

## 2020-11-30 ENCOUNTER — Ambulatory Visit: Payer: Medicare HMO

## 2020-12-01 ENCOUNTER — Ambulatory Visit
Admission: RE | Admit: 2020-12-01 | Discharge: 2020-12-01 | Disposition: A | Discharge status: Home / Self Care | Payer: Medicare HMO | Source: Ambulatory Visit | Attending: Radiation Oncology | Admitting: Radiation Oncology

## 2020-12-01 ENCOUNTER — Other Ambulatory Visit: Payer: Self-pay

## 2020-12-01 DIAGNOSIS — C50512 Malignant neoplasm of lower-outer quadrant of left female breast: Secondary | ICD-10-CM | POA: Diagnosis not present

## 2020-12-01 DIAGNOSIS — Z170 Estrogen receptor positive status [ER+]: Secondary | ICD-10-CM | POA: Diagnosis not present

## 2020-12-01 DIAGNOSIS — Z510 Encounter for antineoplastic radiation therapy: Secondary | ICD-10-CM | POA: Diagnosis not present

## 2020-12-01 DIAGNOSIS — C50412 Malignant neoplasm of upper-outer quadrant of left female breast: Secondary | ICD-10-CM | POA: Diagnosis not present

## 2020-12-02 ENCOUNTER — Ambulatory Visit
Admission: RE | Admit: 2020-12-02 | Discharge: 2020-12-02 | Disposition: A | Discharge status: Home / Self Care | Payer: Medicare HMO | Source: Ambulatory Visit | Attending: Radiation Oncology | Admitting: Radiation Oncology

## 2020-12-02 DIAGNOSIS — Z510 Encounter for antineoplastic radiation therapy: Secondary | ICD-10-CM | POA: Diagnosis not present

## 2020-12-02 DIAGNOSIS — Z170 Estrogen receptor positive status [ER+]: Secondary | ICD-10-CM | POA: Diagnosis not present

## 2020-12-02 DIAGNOSIS — C50512 Malignant neoplasm of lower-outer quadrant of left female breast: Secondary | ICD-10-CM | POA: Diagnosis not present

## 2020-12-02 DIAGNOSIS — C50412 Malignant neoplasm of upper-outer quadrant of left female breast: Secondary | ICD-10-CM | POA: Diagnosis not present

## 2020-12-03 ENCOUNTER — Ambulatory Visit
Admission: RE | Admit: 2020-12-03 | Discharge: 2020-12-03 | Disposition: A | Discharge status: Home / Self Care | Payer: Medicare HMO | Source: Ambulatory Visit | Attending: Radiation Oncology | Admitting: Radiation Oncology

## 2020-12-03 ENCOUNTER — Other Ambulatory Visit: Payer: Self-pay

## 2020-12-03 DIAGNOSIS — Z510 Encounter for antineoplastic radiation therapy: Secondary | ICD-10-CM | POA: Diagnosis not present

## 2020-12-03 DIAGNOSIS — C50412 Malignant neoplasm of upper-outer quadrant of left female breast: Secondary | ICD-10-CM | POA: Diagnosis not present

## 2020-12-03 DIAGNOSIS — Z170 Estrogen receptor positive status [ER+]: Secondary | ICD-10-CM | POA: Diagnosis not present

## 2020-12-03 DIAGNOSIS — C50512 Malignant neoplasm of lower-outer quadrant of left female breast: Secondary | ICD-10-CM | POA: Diagnosis not present

## 2020-12-04 ENCOUNTER — Other Ambulatory Visit: Payer: Self-pay

## 2020-12-04 ENCOUNTER — Ambulatory Visit
Admission: RE | Admit: 2020-12-04 | Discharge: 2020-12-04 | Disposition: A | Discharge status: Home / Self Care | Payer: Medicare HMO | Source: Ambulatory Visit | Attending: Radiation Oncology | Admitting: Radiation Oncology

## 2020-12-04 DIAGNOSIS — Z510 Encounter for antineoplastic radiation therapy: Secondary | ICD-10-CM | POA: Diagnosis not present

## 2020-12-04 DIAGNOSIS — C50412 Malignant neoplasm of upper-outer quadrant of left female breast: Secondary | ICD-10-CM | POA: Diagnosis not present

## 2020-12-04 DIAGNOSIS — Z170 Estrogen receptor positive status [ER+]: Secondary | ICD-10-CM | POA: Diagnosis not present

## 2020-12-04 DIAGNOSIS — C50512 Malignant neoplasm of lower-outer quadrant of left female breast: Secondary | ICD-10-CM | POA: Diagnosis not present

## 2020-12-07 ENCOUNTER — Ambulatory Visit
Admission: RE | Admit: 2020-12-07 | Discharge: 2020-12-07 | Disposition: A | Discharge status: Home / Self Care | Payer: Medicare HMO | Source: Ambulatory Visit | Attending: Radiation Oncology | Admitting: Radiation Oncology

## 2020-12-07 ENCOUNTER — Other Ambulatory Visit: Payer: Self-pay

## 2020-12-07 DIAGNOSIS — Z170 Estrogen receptor positive status [ER+]: Secondary | ICD-10-CM | POA: Diagnosis not present

## 2020-12-07 DIAGNOSIS — Z510 Encounter for antineoplastic radiation therapy: Secondary | ICD-10-CM | POA: Diagnosis not present

## 2020-12-07 DIAGNOSIS — C50512 Malignant neoplasm of lower-outer quadrant of left female breast: Secondary | ICD-10-CM | POA: Diagnosis not present

## 2020-12-07 DIAGNOSIS — C50412 Malignant neoplasm of upper-outer quadrant of left female breast: Secondary | ICD-10-CM | POA: Diagnosis not present

## 2020-12-08 ENCOUNTER — Ambulatory Visit: Payer: Medicare HMO | Admitting: Radiation Oncology

## 2020-12-08 ENCOUNTER — Ambulatory Visit
Admission: RE | Admit: 2020-12-08 | Discharge: 2020-12-08 | Disposition: A | Discharge status: Home / Self Care | Payer: Medicare HMO | Source: Ambulatory Visit | Attending: Radiation Oncology | Admitting: Radiation Oncology

## 2020-12-08 DIAGNOSIS — C50412 Malignant neoplasm of upper-outer quadrant of left female breast: Secondary | ICD-10-CM | POA: Diagnosis not present

## 2020-12-08 DIAGNOSIS — C50512 Malignant neoplasm of lower-outer quadrant of left female breast: Secondary | ICD-10-CM | POA: Diagnosis not present

## 2020-12-08 DIAGNOSIS — Z510 Encounter for antineoplastic radiation therapy: Secondary | ICD-10-CM | POA: Diagnosis not present

## 2020-12-08 DIAGNOSIS — Z170 Estrogen receptor positive status [ER+]: Secondary | ICD-10-CM | POA: Diagnosis not present

## 2020-12-09 ENCOUNTER — Ambulatory Visit
Admission: RE | Admit: 2020-12-09 | Discharge: 2020-12-09 | Disposition: A | Discharge status: Home / Self Care | Payer: Medicare HMO | Source: Ambulatory Visit | Attending: Radiation Oncology | Admitting: Radiation Oncology

## 2020-12-09 ENCOUNTER — Telehealth: Payer: Self-pay

## 2020-12-09 ENCOUNTER — Other Ambulatory Visit: Payer: Self-pay

## 2020-12-09 DIAGNOSIS — C50512 Malignant neoplasm of lower-outer quadrant of left female breast: Secondary | ICD-10-CM | POA: Diagnosis not present

## 2020-12-09 DIAGNOSIS — Z170 Estrogen receptor positive status [ER+]: Secondary | ICD-10-CM | POA: Diagnosis not present

## 2020-12-09 DIAGNOSIS — Z510 Encounter for antineoplastic radiation therapy: Secondary | ICD-10-CM | POA: Diagnosis not present

## 2020-12-09 DIAGNOSIS — C50412 Malignant neoplasm of upper-outer quadrant of left female breast: Secondary | ICD-10-CM | POA: Diagnosis not present

## 2020-12-09 NOTE — Telephone Encounter (Signed)
Called to make daughter and patient aware of appointment on 12/10/20 @ 1030 am ( [facility])   for patient to receive lymphedema sleeve. Address and phone number given to patient.

## 2020-12-10 ENCOUNTER — Ambulatory Visit
Admission: RE | Admit: 2020-12-10 | Discharge: 2020-12-10 | Disposition: A | Discharge status: Home / Self Care | Payer: Medicare HMO | Source: Ambulatory Visit | Attending: Radiation Oncology | Admitting: Radiation Oncology

## 2020-12-10 DIAGNOSIS — Z748 Other problems related to care provider dependency: Secondary | ICD-10-CM | POA: Diagnosis not present

## 2020-12-10 DIAGNOSIS — Z510 Encounter for antineoplastic radiation therapy: Secondary | ICD-10-CM | POA: Diagnosis not present

## 2020-12-10 DIAGNOSIS — C50412 Malignant neoplasm of upper-outer quadrant of left female breast: Secondary | ICD-10-CM | POA: Diagnosis not present

## 2020-12-10 DIAGNOSIS — C50512 Malignant neoplasm of lower-outer quadrant of left female breast: Secondary | ICD-10-CM | POA: Diagnosis not present

## 2020-12-10 DIAGNOSIS — Z170 Estrogen receptor positive status [ER+]: Secondary | ICD-10-CM | POA: Diagnosis not present

## 2020-12-10 DIAGNOSIS — Z0000 Encounter for general adult medical examination without abnormal findings: Secondary | ICD-10-CM | POA: Diagnosis not present

## 2020-12-11 ENCOUNTER — Ambulatory Visit (INDEPENDENT_AMBULATORY_CARE_PROVIDER_SITE_OTHER): Payer: Medicare HMO

## 2020-12-11 ENCOUNTER — Ambulatory Visit
Admission: RE | Admit: 2020-12-11 | Discharge: 2020-12-11 | Disposition: A | Discharge status: Home / Self Care | Payer: Medicare HMO | Source: Ambulatory Visit | Attending: Radiation Oncology | Admitting: Radiation Oncology

## 2020-12-11 ENCOUNTER — Other Ambulatory Visit: Payer: Self-pay

## 2020-12-11 VITALS — BP 132/68 | HR 73 | Temp 97.9°F | Wt 163.8 lb

## 2020-12-11 DIAGNOSIS — Z748 Other problems related to care provider dependency: Secondary | ICD-10-CM

## 2020-12-11 DIAGNOSIS — Z510 Encounter for antineoplastic radiation therapy: Secondary | ICD-10-CM | POA: Diagnosis not present

## 2020-12-11 DIAGNOSIS — C50412 Malignant neoplasm of upper-outer quadrant of left female breast: Secondary | ICD-10-CM | POA: Diagnosis not present

## 2020-12-11 DIAGNOSIS — Z170 Estrogen receptor positive status [ER+]: Secondary | ICD-10-CM | POA: Diagnosis not present

## 2020-12-11 DIAGNOSIS — C50512 Malignant neoplasm of lower-outer quadrant of left female breast: Secondary | ICD-10-CM | POA: Diagnosis not present

## 2020-12-11 DIAGNOSIS — Z0000 Encounter for general adult medical examination without abnormal findings: Secondary | ICD-10-CM

## 2020-12-11 NOTE — Patient Instructions (Addendum)
Debra Simon , Thank you for taking time to come for your Medicare Wellness Visit. I appreciate your ongoing commitment to your health goals. Please review the following plan we discussed and let me know if I can assist you in the future.   Screening recommendations/referrals: Colonoscopy: Done 02/14/20 repeat every 5 years due 02/13/25 Mammogram: Done 08/28/20 repeat every year  Bone Density: Done 04/18/17 repeat every 2 years  Recommended yearly ophthalmology/optometry visit for glaucoma screening and checkup Recommended yearly dental visit for hygiene and checkup  Vaccinations: Influenza vaccine: Due and discussed  Pneumococcal vaccine: Completed  Tdap vaccine: Done 01/22/18 repeat every 10 years due 01/23/28 Shingles vaccine: Completed 4/27 & 11/18/16    Covid-19:Completed 4/30, 5/28, & 03/05/20  Advanced directives: Please bring a copy of your health care power of attorney and living will to the office at your convenience.  Conditions/risks identified: lose weight and more exercise   Next appointment: Follow up in one year for your annual wellness visit    Preventive Care 65 Years and Older, Female Preventive care refers to lifestyle choices and visits with your health care provider that can promote health and wellness. What does preventive care include? A yearly physical exam. This is also called an annual well check. Dental exams once or twice a year. Routine eye exams. Ask your health care provider how often you should have your eyes checked. Personal lifestyle choices, including: Daily care of your teeth and gums. Regular physical activity. Eating a healthy diet. Avoiding tobacco and drug use. Limiting alcohol use. Practicing safe sex. Taking low-dose aspirin every day. Taking vitamin and mineral supplements as recommended by your health care provider. What happens during an annual well check? The services and screenings done by your health care provider during your annual  well check will depend on your age, overall health, lifestyle risk factors, and family history of disease. Counseling  Your health care provider may ask you questions about your: Alcohol use. Tobacco use. Drug use. Emotional well-being. Home and relationship well-being. Sexual activity. Eating habits. History of falls. Memory and ability to understand (cognition). Work and work Statistician. Reproductive health. Screening  You may have the following tests or measurements: Height, weight, and BMI. Blood pressure. Lipid and cholesterol levels. These may be checked every 5 years, or more frequently if you are over 22 years old. Skin check. Lung cancer screening. You may have this screening every year starting at age 22 if you have a 30-pack-year history of smoking and currently smoke or have quit within the past 15 years. Fecal occult blood test (FOBT) of the stool. You may have this test every year starting at age 36. Flexible sigmoidoscopy or colonoscopy. You may have a sigmoidoscopy every 5 years or a colonoscopy every 10 years starting at age 20. Hepatitis C blood test. Hepatitis B blood test. Sexually transmitted disease (STD) testing. Diabetes screening. This is done by checking your blood sugar (glucose) after you have not eaten for a while (fasting). You may have this done every 1-3 years. Bone density scan. This is done to screen for osteoporosis. You may have this done starting at age 69. Mammogram. This may be done every 1-2 years. Talk to your health care provider about how often you should have regular mammograms. Talk with your health care provider about your test results, treatment options, and if necessary, the need for more tests. Vaccines  Your health care provider may recommend certain vaccines, such as: Influenza vaccine. This is recommended every year.  Tetanus, diphtheria, and acellular pertussis (Tdap, Td) vaccine. You may need a Td booster every 10 years. Zoster  vaccine. You may need this after age 55. Pneumococcal 13-valent conjugate (PCV13) vaccine. One dose is recommended after age 85. Pneumococcal polysaccharide (PPSV23) vaccine. One dose is recommended after age 67. Talk to your health care provider about which screenings and vaccines you need and how often you need them. This information is not intended to replace advice given to you by your health care provider. Make sure you discuss any questions you have with your health care provider. Document Released: 04/10/2015 Document Revised: 12/02/2015 Document Reviewed: 01/13/2015 Elsevier Interactive Patient Education  2017 [employer] Prevention in the Home Falls can cause injuries. They can happen to people of all ages. There are many things you can do to make your home safe and to help prevent falls. What can I do on the outside of my home? Regularly fix the edges of walkways and driveways and fix any cracks. Remove anything that might make you trip as you walk through a door, such as a raised step or threshold. Trim any bushes or trees on the path to your home. Use bright outdoor lighting. Clear any walking paths of anything that might make someone trip, such as rocks or tools. Regularly check to see if handrails are loose or broken. Make sure that both sides of any steps have handrails. Any raised decks and porches should have guardrails on the edges. Have any leaves, snow, or ice cleared regularly. Use sand or salt on walking paths during winter. Clean up any spills in your garage right away. This includes oil or grease spills. What can I do in the bathroom? Use night lights. Install grab bars by the toilet and in the tub and shower. Do not use towel bars as grab bars. Use non-skid mats or decals in the tub or shower. If you need to sit down in the shower, use a plastic, non-slip stool. Keep the floor dry. Clean up any water that spills on the floor as soon as it happens. Remove  soap buildup in the tub or shower regularly. Attach bath mats securely with double-sided non-slip rug tape. Do not have throw rugs and other things on the floor that can make you trip. What can I do in the bedroom? Use night lights. Make sure that you have a light by your bed that is easy to reach. Do not use any sheets or blankets that are too big for your bed. They should not hang down onto the floor. Have a firm chair that has side arms. You can use this for support while you get dressed. Do not have throw rugs and other things on the floor that can make you trip. What can I do in the kitchen? Clean up any spills right away. Avoid walking on wet floors. Keep items that you use a lot in easy-to-reach places. If you need to reach something above you, use a strong step stool that has a grab bar. Keep electrical cords out of the way. Do not use floor polish or wax that makes floors slippery. If you must use wax, use non-skid floor wax. Do not have throw rugs and other things on the floor that can make you trip. What can I do with my stairs? Do not leave any items on the stairs. Make sure that there are handrails on both sides of the stairs and use them. Fix handrails that are broken or loose.  Make sure that handrails are as long as the stairways. Check any carpeting to make sure that it is firmly attached to the stairs. Fix any carpet that is loose or worn. Avoid having throw rugs at the top or bottom of the stairs. If you do have throw rugs, attach them to the floor with carpet tape. Make sure that you have a light switch at the top of the stairs and the bottom of the stairs. If you do not have them, ask someone to add them for you. What else can I do to help prevent falls? Wear shoes that: Do not have high heels. Have rubber bottoms. Are comfortable and fit you well. Are closed at the toe. Do not wear sandals. If you use a stepladder: Make sure that it is fully opened. Do not climb a  closed stepladder. Make sure that both sides of the stepladder are locked into place. Ask someone to hold it for you, if possible. Clearly mark and make sure that you can see: Any grab bars or handrails. First and last steps. Where the edge of each step is. Use tools that help you move around (mobility aids) if they are needed. These include: Canes. Walkers. Scooters. Crutches. Turn on the lights when you go into a dark area. Replace any light bulbs as soon as they burn out. Set up your furniture so you have a clear path. Avoid moving your furniture around. If any of your floors are uneven, fix them. If there are any pets around you, be aware of where they are. Review your medicines with your doctor. Some medicines can make you feel dizzy. This can increase your chance of falling. Ask your doctor what other things that you can do to help prevent falls. This information is not intended to replace advice given to you by your health care provider. Make sure you discuss any questions you have with your health care provider. Document Released: 01/08/2009 Document Revised: 08/20/2015 Document Reviewed: 04/18/2014 Elsevier Interactive Patient Education  2017 [employer].

## 2020-12-11 NOTE — Progress Notes (Signed)
Virtual Visit via Telephone Note  I connected with  Debra Simon on 12/11/20 at 10:15 AM EDT by telephone and verified that I am speaking with the correct person using two identifiers.  Medicare Annual Wellness visit completed telephonically due to Covid-19 pandemic.   Persons participating in this call: This Health Coach and this patient.   Location: Patient: Home Provider: Office   I discussed the limitations, risks, security and privacy concerns of performing an evaluation and management service by telephone and the availability of in person appointments. The patient expressed understanding and agreed to proceed.  Unable to perform video visit due to video visit attempted and failed and/or patient does not have video capability.   Some vital signs may be absent or patient reported.   Willette Brace, LPN   Subjective:   Debra Simon is a 70 y.o. female who presents for Medicare Annual (Subsequent) preventive examination.  Review of Systems     Cardiac Risk Factors include: advanced age (>77mn, >>53women);hypertension;dyslipidemia;obesity (BMI >30kg/m2)     Objective:    Today's Vitals   12/11/20 1010  BP: 132/68  Pulse: 73  Temp: 97.9 F (36.6 C)  SpO2: 99%  Weight: 163 lb 12.8 oz (74.3 kg)   Body mass index is 30.95 kg/m.  Advanced Directives 12/11/2020 11/11/2020 10/12/2020 09/09/2020 11/21/2019 06/06/2017 05/25/2017  Does Patient Have a Medical Advance Directive? Yes No No No No No No  Does patient want to make changes to medical advance directive? Yes (MAU/Ambulatory/Procedural Areas - Information given) - - - - - -  Would patient like information on creating a medical advance directive? - Yes (MAU/Ambulatory/Procedural Areas - Information given) Yes (MAU/Ambulatory/Procedural Areas - Information given) No - Patient declined Yes (MAU/Ambulatory/Procedural Areas - Information given) No - Patient declined No - Patient declined    Current Medications  (verified) Outpatient Encounter Medications as of 12/11/2020  Medication Sig   anastrozole (ARIMIDEX) 1 MG tablet Take 1 tablet (1 mg total) by mouth daily. Start December 26, 2020   aspirin EC 81 MG tablet Take 1 tablet (81 mg total) by mouth daily.   losartan (COZAAR) 100 MG tablet Take 1 tablet (100 mg total) by mouth daily.   Multiple Vitamin (MULTIVITAMIN) tablet Take 1 tablet by mouth daily.   simvastatin (ZOCOR) 20 MG tablet Take 1 tablet (20 mg total) by mouth at bedtime.   [DISCONTINUED] hydrochlorothiazide (HYDRODIURIL) 25 MG tablet TAKE 1 TABLET (25 MG TOTAL) BY MOUTH DAILY. (Patient not taking: No sig reported)   No facility-administered encounter medications on file as of 12/11/2020.    Allergies (verified) Patient has no known allergies.   History: Past Medical History:  Diagnosis Date   Anxiety    Arthritis    [place]ast cancer ([place]    Colon polyps [id number]  Diastolic dysfunction, left ventricle 06/26/2016   (ECHO 2018): Left ventricle: The cavity size was normal. Systolic function was normal. The estimated ejection fraction was in the range of 60% to 65%. Wall motion was normal; there were no regional wall motion abnormalities. Doppler parameters are consistent with abnormal left ventricular relaxation (grade 1 diastolic dysfunction).   Diverticulosis    Family history of brain cancer    Family history of breast cancer    Family history of pancreatic cancer    Family history of uterine cancer    GERD (gastroesophageal reflux disease)    watches what she eats   Headache    gets headaches when she eats sweets  Heart murmur    dx last yr.   had echo 05/2016   Hyperlipidemia    Hypertension    Mild mitral regurgitation 07/04/2016   Pneumonia    Pre-diabetes    Prediabetes 11/11/2020   Primary localized osteoarthritis of right knee 05/24/2017   Ureterovaginal fistula    Uterine fibroid 1994   Vaginal dysplasia    Past Surgical History:  Procedure Laterality Date    BREAST CYST ASPIRATION Right    BREAST EXCISIONAL BIOPSY Right    BREAST LUMPECTOMY WITH RADIOACTIVE SEED AND SENTINEL LYMPH NODE BIOPSY Left 10/15/2020   Procedure: LEFT BREAST LUMPECTOMY WITH RADIOACTIVE SEED AND SENTINEL LYMPH NODE BIOPSY;  Surgeon: Stark Klein, MD;  Location: [facility];  Service: General;  Laterality: Left;   COLONOSCOPY  2010   COLONOSCOPY  2012   COLONOSCOPY  2016   CYSTOSCOPY     ESOPHAGOGASTRODUODENOSCOPY  2010   TOTAL ABDOMINAL HYSTERECTOMY  1994   TOTAL KNEE ARTHROPLASTY Right 06/05/2017   TOTAL KNEE ARTHROPLASTY Right 06/05/2017   Procedure: RIGHT TOTAL KNEE ARTHROPLASTY;  Surgeon: Elsie Saas, MD;  Location: [facility];  Service: Orthopedics;  Laterality: Right;   Ueterovaginal fistual repair     URETERAL EXPLORATION  04/16/1992   URETEROTOMY ON THE RIGHT, REPAIR LACERATION ON THE LEFT   Family History  Problem Relation Age of Onset   Diabetes Mother    Hypertension Mother    Brain cancer Father        d. 20   Hypertension Sister    Diabetes Sister    Diabetes Brother    Breast cancer Paternal Aunt        dx >50   Uterine cancer Paternal Aunt        dx >50   Cancer Maternal Grandmother        unknown type, died in her 86s   Pancreatic cancer Cousin 80       maternal first cousin   Uterine cancer Cousin        dx 25s, maternal first cousin   Breast cancer Cousin 53       paternal first cousin   CAD Neg Hx    Stroke Neg Hx    Colon cancer Neg Hx    Colon polyps Neg Hx    Esophageal cancer Neg Hx    Stomach cancer Neg Hx    Rectal cancer Neg Hx    Social History   Socioeconomic History   Marital status: Married    Spouse name: Not on file   Number of children: Not on file   Years of education: Not on file   Highest education level: GED or equivalent  Occupational History   Occupation: Housewife  Tobacco Use   Smoking status: Never   Smokeless tobacco: Never  Vaping Use   Vaping Use: Never used  Substance and Sexual Activity   Alcohol  use: No    Alcohol/week: 0.0 standard drinks    Comment: rare on holidays   Drug use: No   Sexual activity: Never  Other Topics Concern   Not on file  Social History Narrative   Not on file   Social Determinants of Health   Financial Resource Strain: Low Risk    Difficulty of Paying Living Expenses: Not hard at all  Food Insecurity: No Food Insecurity   Worried About Charity fundraiser in the Last Year: Never true   [place] in the Last Year: Never true  Transportation Needs:  Unmet Transportation Needs   Lack of Transportation (Medical): Yes   Lack of Transportation (Non-Medical): No  Physical Activity: Sufficiently Active   Days of Exercise per Week: 7 days   Minutes of Exercise per Session: 30 min  Stress: No Stress Concern Present   Feeling of Stress : Not at all  Social Connections: Moderately Integrated   Frequency of Communication with Friends and Family: More than three times a week   Frequency of Social Gatherings with Friends and Family: More than three times a week   Attends Religious Services: More than 4 times per year   Active Member of [employer] or Organizations: No   Attends Music therapist: Never   Marital Status: Married    Tobacco Counseling Counseling given: Not Answered   Clinical Intake:  Pre-visit preparation completed: Yes  Pain : No/denies pain     BMI - recorded: 30.95 Nutritional Status: BMI > 30  Obese Nutritional Risks: None Diabetes: No  How often do you need to have someone help you when you read instructions, pamphlets, or other written materials from your doctor or pharmacy?: 1 - Never  Diabetic?No  Interpreter Needed?: Yes Interpreter Agency: [place] Interpreter Name: Cresenciano Genre Patient Declined Interpreter : No Patient signed [place] waiver: Yes  Information entered by :: Charlott Rakes, LPN   Activities of Daily Living In your present state of health, do you have any difficulty performing  the following activities: 12/11/2020 10/12/2020  Hearing? Y Y  Comment slight Hoh difficult hearing in crowds or a lot of noise  Vision? N N  Comment - wears glasses  Difficulty concentrating or making decisions? N N  Walking or climbing stairs? Y N  Comment going down at times -  Dressing or bathing? N N  Doing errands, shopping? N N  Preparing Food and eating ? N -  Using the Toilet? N -  In the past six months, have you accidently leaked urine? Y -  Comment coughing and sneezing -  Do you have problems with loss of bowel control? N -  Managing your Medications? N -  Managing your Finances? N -  Housekeeping or managing your Housekeeping? N -  Some recent data might be hidden    Patient Care Team: Leamon Arnt, MD as PCP - General (Family Medicine) Elsie Saas, MD as Consulting Physician (Orthopedic Surgery) Princess Bruins, MD as Consulting Physician (Obstetrics and Gynecology) Rockwell Germany, RN as Oncology Nurse Navigator Mauro Kaufmann, RN as Oncology Nurse Navigator Stark Klein, MD as Consulting Physician (General Surgery) Magrinat, Virgie Dad, MD as Consulting Physician (Oncology) Gery Pray, MD as Consulting Physician (Radiation Oncology)  Indicate any recent Medical Services you may have received from other than [facility] providers in the past year (date may be approximate).     Assessment:   This is a routine wellness examination for Debra Simon.  Hearing/Vision screen Hearing Screening - Comments:: Pt stated HOH at times and with different languages  Vision Screening - Comments:: Pt follows up with Dr for annual eye glasses   Dietary issues and exercise activities discussed: Current Exercise Habits: Home exercise routine, Type of exercise: walking, Time (Minutes): 30, Frequency (Times/Week): 7, Weekly Exercise (Minutes/Week): 210   Goals Addressed             This Visit's Progress    Patient Stated       Lose weight and exercise         Depression Screen PHQ 2/9 Scores  12/11/2020 07/08/2020 11/21/2019 09/27/2019 12/26/2017 11/16/2016  PHQ - 2 Score 0 0 0 2 0 0  PHQ- 9 Score - - - 2 - -    Fall Risk Fall Risk  12/11/2020 07/08/2020 11/21/2019 09/27/2019 12/26/2017  Falls in the past year? 0 0 0 0 No  Number falls in past yr: 0 0 0 0 -  Injury with Fall? 0 0 0 0 -  Risk for fall due to : Impaired vision - Impaired vision;Orthopedic patient;Impaired balance/gait - -  Risk for fall due to: Comment - - if move quickly can get dizzy - -  Follow up Falls prevention discussed - - - -    FALL RISK PREVENTION PERTAINING TO THE HOME:  Any stairs in or around the home? Yes  If so, are there any without handrails? No  Home free of loose throw rugs in walkways, pet beds, electrical cords, etc? Yes  Adequate lighting in your home to reduce risk of falls? Yes   ASSISTIVE DEVICES UTILIZED TO PREVENT FALLS:  Life alert? Yes  Use of a cane, walker or w/c? No  Grab bars in the bathroom? Yes  Shower chair or bench in shower? No  Elevated toilet seat or a handicapped toilet? No   TIMED UP AND GO:  Was the test performed? Yes .  Length of time to ambulate 10 feet: 10 sec.   Gait steady and fast without use of assistive device  Cognitive Function:     6CIT Screen 12/11/2020 11/21/2019  What Year? 0 points 0 points  What month? 0 points 0 points  What time? 0 points -  Count back from 20 0 points 0 points  Months in reverse 0 points 2 points  Repeat phrase 0 points 0 points  Total Score 0 -    Immunizations Immunization History  Administered Date(s) Administered   Fluad Quad(high Dose 65+) 01/08/2020   Influenza, High Dose Seasonal PF 12/26/2017   Influenza,inj,Quad PF,6+ Mos 01/09/2019   Moderna Sars-Covid-2 Vaccination 07/26/2019, 08/23/2019   PFIZER(Purple Top)SARS-COV-2 Vaccination 03/05/2020   Pneumococcal Conjugate-13 12/26/2017   Pneumococcal Polysaccharide-23 09/27/2019   Tdap 01/22/2018   Zoster Recombinat  (Shingrix) 07/22/2016, 11/18/2016    TDAP status: Up to date  Flu Vaccine status: Due, Education has been provided regarding the importance of this vaccine. Advised may receive this vaccine at local pharmacy or Health Dept. Aware to provide a copy of the vaccination record if obtained from local pharmacy or Health Dept. Verbalized acceptance and understanding.  Pneumococcal vaccine status: Up to date  Covid-19 vaccine status: Completed vaccines  Qualifies for Shingles Vaccine? Yes   Zostavax completed Yes   Shingrix Completed?: Yes  Screening Tests Health Maintenance  Topic Date Due   COVID-19 Vaccine (4 - Booster) 05/28/2020   INFLUENZA VACCINE  10/26/2020   MAMMOGRAM  08/06/2021   DEXA SCAN  04/18/2022   COLONOSCOPY (Pts 45-104yr Insurance coverage will need to be confirmed)  02/13/2025   TETANUS/TDAP  01/23/2028   Hepatitis C Screening  Completed   PNA vac Low Risk Adult  Completed   Zoster Vaccines- Shingrix  Completed   HPV VACCINES  Aged Out    Health Maintenance  Health Maintenance Due  Topic Date Due   COVID-19 Vaccine (4 - Booster) 05/28/2020   INFLUENZA VACCINE  10/26/2020    Colorectal cancer screening: Type of screening: Colonoscopy. Completed 02/14/20. Repeat every 5 years  Mammogram status: Completed 08/06/20. Repeat every year  Bone density 04/18/17    Additional  Screening:  Hepatitis C Screening:  Completed 01/20/17  Vision Screening: Recommended annual ophthalmology exams for early detection of glaucoma and other disorders of the eye. Is the patient up to date with their annual eye exam?  Yes  Who is the provider or what is the name of the office in which the patient attends annual eye exams? [facility] If pt is not established with a provider, would they like to be referred to a provider to establish care? No .   Dental Screening: Recommended annual dental exams for proper oral hygiene  Community Resource Referral / Chronic Care  Management: CRR required this visit?  Yes   CCM required this visit?  Yes      Plan:     I have personally reviewed and noted the following in the patient's chart:   Medical and social history Use of alcohol, tobacco or illicit drugs  Current medications and supplements including opioid prescriptions.  Functional ability and status Nutritional status Physical activity Advanced directives List of other physicians Hospitalizations, surgeries, and ER visits in previous 12 months Vitals Screenings to include cognitive, depression, and falls Referrals and appointments  In addition, I have reviewed and discussed with patient certain preventive protocols, quality metrics, and best practice recommendations. A written personalized care plan for preventive services as well as general preventive health recommendations were provided to patient.     Willette Brace, LPN   [id number]   Nurse Notes: None

## 2020-12-14 ENCOUNTER — Other Ambulatory Visit: Payer: Self-pay

## 2020-12-14 ENCOUNTER — Ambulatory Visit
Admission: RE | Admit: 2020-12-14 | Discharge: 2020-12-14 | Disposition: A | Discharge status: Home / Self Care | Payer: Medicare HMO | Source: Ambulatory Visit | Attending: Radiation Oncology | Admitting: Radiation Oncology

## 2020-12-14 DIAGNOSIS — Z170 Estrogen receptor positive status [ER+]: Secondary | ICD-10-CM | POA: Diagnosis not present

## 2020-12-14 DIAGNOSIS — C50512 Malignant neoplasm of lower-outer quadrant of left female breast: Secondary | ICD-10-CM | POA: Diagnosis not present

## 2020-12-14 DIAGNOSIS — Z510 Encounter for antineoplastic radiation therapy: Secondary | ICD-10-CM | POA: Diagnosis not present

## 2020-12-14 DIAGNOSIS — C50412 Malignant neoplasm of upper-outer quadrant of left female breast: Secondary | ICD-10-CM | POA: Diagnosis not present

## 2020-12-15 ENCOUNTER — Ambulatory Visit
Admission: RE | Admit: 2020-12-15 | Discharge: 2020-12-15 | Disposition: A | Discharge status: Home / Self Care | Payer: Medicare HMO | Source: Ambulatory Visit | Attending: Radiation Oncology | Admitting: Radiation Oncology

## 2020-12-15 ENCOUNTER — Telehealth: Payer: Self-pay | Admitting: *Deleted

## 2020-12-15 DIAGNOSIS — C50512 Malignant neoplasm of lower-outer quadrant of left female breast: Secondary | ICD-10-CM | POA: Diagnosis not present

## 2020-12-15 DIAGNOSIS — Z170 Estrogen receptor positive status [ER+]: Secondary | ICD-10-CM | POA: Diagnosis not present

## 2020-12-15 DIAGNOSIS — Z510 Encounter for antineoplastic radiation therapy: Secondary | ICD-10-CM | POA: Diagnosis not present

## 2020-12-15 DIAGNOSIS — C50412 Malignant neoplasm of upper-outer quadrant of left female breast: Secondary | ICD-10-CM | POA: Diagnosis not present

## 2020-12-15 NOTE — Telephone Encounter (Signed)
   Telephone encounter was:  Successful.  12/15/2020 Name: Aryah Doering MRN: [id number] DOB: November 23, 1950  Thy Gullikson is a 70 y.o. year old female who is a primary care patient of Leamon Arnt, MD . The community resource team was consulted for assistance with Transportation Needs Mardene Celeste (intrpreter) called patient about transportation options available to the patient requested information my mail about available options.Will send [employer] and [place] transportation.  Care guide performed the following interventions: Patient provided with information about care guide support team and interviewed to confirm resource needs.  Follow Up Plan:  No further follow up planned at this time. The patient has been provided with needed resources.  [place], Care Management  [phone] [address] , [place] [address] Email : [email or web address]

## 2020-12-16 ENCOUNTER — Other Ambulatory Visit: Payer: Self-pay

## 2020-12-16 ENCOUNTER — Ambulatory Visit
Admission: RE | Admit: 2020-12-16 | Discharge: 2020-12-16 | Disposition: A | Discharge status: Home / Self Care | Payer: Medicare HMO | Source: Ambulatory Visit | Attending: Radiation Oncology | Admitting: Radiation Oncology

## 2020-12-16 DIAGNOSIS — C50512 Malignant neoplasm of lower-outer quadrant of left female breast: Secondary | ICD-10-CM | POA: Diagnosis not present

## 2020-12-16 DIAGNOSIS — Z510 Encounter for antineoplastic radiation therapy: Secondary | ICD-10-CM | POA: Diagnosis not present

## 2020-12-16 DIAGNOSIS — Z170 Estrogen receptor positive status [ER+]: Secondary | ICD-10-CM | POA: Diagnosis not present

## 2020-12-16 DIAGNOSIS — C50412 Malignant neoplasm of upper-outer quadrant of left female breast: Secondary | ICD-10-CM | POA: Diagnosis not present

## 2020-12-17 ENCOUNTER — Encounter: Payer: Self-pay | Admitting: Radiation Oncology

## 2020-12-17 ENCOUNTER — Ambulatory Visit
Admission: RE | Admit: 2020-12-17 | Discharge: 2020-12-17 | Disposition: A | Discharge status: Home / Self Care | Payer: Medicare HMO | Source: Ambulatory Visit | Attending: Radiation Oncology | Admitting: Radiation Oncology

## 2020-12-17 ENCOUNTER — Encounter: Payer: Self-pay | Admitting: *Deleted

## 2020-12-17 DIAGNOSIS — Z510 Encounter for antineoplastic radiation therapy: Secondary | ICD-10-CM | POA: Diagnosis not present

## 2020-12-17 DIAGNOSIS — Z170 Estrogen receptor positive status [ER+]: Secondary | ICD-10-CM | POA: Diagnosis not present

## 2020-12-17 DIAGNOSIS — C50512 Malignant neoplasm of lower-outer quadrant of left female breast: Secondary | ICD-10-CM

## 2020-12-17 DIAGNOSIS — C50412 Malignant neoplasm of upper-outer quadrant of left female breast: Secondary | ICD-10-CM | POA: Diagnosis not present

## 2020-12-18 ENCOUNTER — Telehealth: Payer: Self-pay

## 2020-12-18 NOTE — Telephone Encounter (Signed)
Nurse Assessment Nurse: Lenon Curt, RN, Melanie Date/Time Eilene Ghazi Time): 12/18/2020 12:17:10 PM Confirm and document reason for call. If symptomatic, describe symptoms. ---His mother got bit by a centipede on her finger about 40+ minutes and is now throbbing. Stopped bleeding after washing, alcohol and now open to air. A little red, no swelling. Breast surgery on that side about 2 months ago. Yesterday last day of radiation. Does the patient have any new or worsening symptoms? ---Yes Will a triage be completed? ---Yes Related visit to physician within the last 2 weeks? ---Yes Does the PT have any chronic conditions? (i.e. diabetes, asthma, this includes High risk factors for pregnancy, etc.) ---Yes Is this a behavioral health or substance abuse call? ---No Guidelines Guideline Title Affirmed Question Affirmed Notes Nurse Date/Time Eilene Ghazi Time) Conservator, museum/gallery All other insect bites Lenon Curt, RN, Threasa Beards 12/18/2020 12:19:07 PM Disp. Time Eilene Ghazi Time) Disposition Final User 12/18/2020 12:24:25 [place], RN, Melanie PLEASE NOTE: All timestamps contained within this report are represented as [place] Standard Time. CONFIDENTIALTY NOTICE: This fax transmission is intended only for the addressee. It contains information that is legally privileged, confidential or otherwise protected from use or disclosure. If you are not the intended recipient, you are strictly prohibited from reviewing, disclosing, copying using or disseminating any of this information or taking any action in reliance on or regarding this information. If you have received this fax in error, please notify us immediately by telephone so that we can arrange for its return to [place]. Phone: [phone], Toll-Free: [phone], Fax: [phone] Page: 2 of 2 Call Id: [id number] Caller Disagree/Comply Comply Caller Understands Yes PreDisposition Call Doctor Care Advice Given Per Guideline HOME CARE: * You should be able to  treat this at home. REASSURANCE AND EDUCATION - INSECT BITE(S): * The most common symptom of an insect bite is LOCAL SKIN REACTION with a SMALL RED BUMP. THERE MAY BE many small red bumps if you were bitten multiple times. Other symptoms are: ITCHING, PAIN, small area of localized hives (a welt or slight swelling), or small area of localized redness. Sometimes a tiny water blister forms in the center of the insect bite. CALAMINE LOTION FOR ITCHING: CARE ADVICE given per Insect Bite (Adult) guideline. * You become worse * Redness getting larger and more than 48 hours after the bite * Bite has not healed after 14 days * Bite looks infected (pus, red streaks, increased tenderness) * Severe pain persists over 2 hours after pain medicine CALL BACK IF: EXPECTED COURSE

## 2020-12-21 NOTE — Telephone Encounter (Signed)
fyi

## 2020-12-23 ENCOUNTER — Telehealth: Payer: Self-pay | Admitting: Adult Health

## 2020-12-23 NOTE — Telephone Encounter (Signed)
Scheduled per sch msg. Called and left msg  

## 2021-01-04 ENCOUNTER — Encounter: Payer: Self-pay | Admitting: Family Medicine

## 2021-01-04 ENCOUNTER — Other Ambulatory Visit: Payer: Self-pay

## 2021-01-04 ENCOUNTER — Ambulatory Visit (INDEPENDENT_AMBULATORY_CARE_PROVIDER_SITE_OTHER): Payer: Medicare HMO | Admitting: Family Medicine

## 2021-01-04 VITALS — BP 120/74 | HR 68 | Temp 98.0°F | Ht 61.0 in | Wt 161.8 lb

## 2021-01-04 DIAGNOSIS — Z170 Estrogen receptor positive status [ER+]: Secondary | ICD-10-CM

## 2021-01-04 DIAGNOSIS — Z8673 Personal history of transient ischemic attack (TIA), and cerebral infarction without residual deficits: Secondary | ICD-10-CM | POA: Diagnosis not present

## 2021-01-04 DIAGNOSIS — C50512 Malignant neoplasm of lower-outer quadrant of left female breast: Secondary | ICD-10-CM | POA: Diagnosis not present

## 2021-01-04 DIAGNOSIS — I10 Essential (primary) hypertension: Secondary | ICD-10-CM | POA: Diagnosis not present

## 2021-01-04 DIAGNOSIS — R7303 Prediabetes: Secondary | ICD-10-CM | POA: Diagnosis not present

## 2021-01-04 DIAGNOSIS — Z23 Encounter for immunization: Secondary | ICD-10-CM

## 2021-01-04 DIAGNOSIS — E782 Mixed hyperlipidemia: Secondary | ICD-10-CM

## 2021-01-04 LAB — POCT GLYCOSYLATED HEMOGLOBIN (HGB A1C): Hemoglobin A1C: 5.6 % (ref 4.0–5.6)

## 2021-01-04 MED ORDER — LOSARTAN POTASSIUM-HCTZ 100-25 MG PO TABS: 1.0000 | ORAL_TABLET | Freq: Every day | ORAL | 3 refills | Status: DC

## 2021-01-04 NOTE — Patient Instructions (Signed)
Please return in 6 months for your annual complete physical; please come fasting.   Everything looks good!  Today you were given your flu vaccination.    If you have any questions or concerns, please don't hesitate to send me a message via MyChart or call the office at [phone]. Thank you for visiting with [place] today! It's our pleasure caring for you.

## 2021-01-04 NOTE — Progress Notes (Signed)
Subjective  CC:  Chief Complaint  Patient presents with   Hypertension   Hyperlipidemia    HPI: Debra Simon is a 70 y.o. female who presents to the office today to address the problems listed above in the chief complaint. Hypertension f/u: Control is good . Pt reports she is doing well. taking medications as instructed, no medication side effects noted, no TIAs, no chest pain on exertion, no dyspnea on exertion, no swelling of ankles. She denies adverse effects from his BP medications. Compliance with medication is good.  Prediabetes: now walking 30 minutes per day. Weight is down. Feels good. No sxs of hyperglycemia Breast cancer s/p left lumptectomy, rad tx and now on arimidex. Reviewed hospital and onc notes. She is managing well. Had genetic testing done as well. All negative.  H/o CVA and HLD on statin, LDL at goal.   Assessment  1. Essential hypertension   2. Prediabetes   3. Mixed hyperlipidemia   4. History of lacunar cerebrovascular accident   5. Malignant neoplasm of lower-outer quadrant of left breast of female, estrogen receptor positive ([place])   6. Need for immunization against influenza      Plan   Hypertension f/u: BP control is well controlled. Contineu hyzaar 100/25 daily.  Hyperlipidemia f/u: at goal on statin Breast Cancer: stable.  H/o CVA Flu shot today Education regarding management of these chronic disease states was given. Management strategies discussed on successive visits include dietary and exercise recommendations, goals of achieving and maintaining IBW, and lifestyle modifications aiming for adequate sleep and minimizing stressors.   Follow up: 6 mo for CPE  Orders Placed This Encounter  Procedures   Flu Vaccine QUAD High Dose(Fluad)   POCT HgB A1C   Meds ordered this encounter  Medications   losartan-hydrochlorothiazide (HYZAAR) 100-25 MG tablet    Sig: Take 1 tablet by mouth daily.    Dispense:  90 tablet    Refill:  3       BP  Readings from Last 3 Encounters:  01/04/21 120/74  12/11/20 132/68  11/12/20 (!) 150/66   Wt Readings from Last 3 Encounters:  01/04/21 161 lb 12.8 oz (73.4 kg)  12/11/20 163 lb 12.8 oz (74.3 kg)  11/12/20 168 lb 8 oz (76.4 kg)    Lab Results  Component Value Date   CHOL 102 07/08/2020   CHOL 136 09/27/2019   CHOL 142 12/26/2017   Lab Results  Component Value Date   HDL 44.50 07/08/2020   HDL 44.70 09/27/2019   HDL 41.60 12/26/2017   Lab Results  Component Value Date   LDLCALC 42 07/08/2020   LDLCALC 73 09/27/2019   LDLCALC 84 12/26/2017   Lab Results  Component Value Date   TRIG 80.0 07/08/2020   TRIG 94.0 09/27/2019   TRIG 85.0 12/26/2017   Lab Results  Component Value Date   CHOLHDL 2 07/08/2020   CHOLHDL 3 09/27/2019   CHOLHDL 3 12/26/2017   No results found for: LDLDIRECT Lab Results  Component Value Date   CREATININE 0.74 11/12/2020   BUN 12 11/12/2020   NA 138 11/12/2020   K 3.5 11/12/2020   CL 101 11/12/2020   CO2 28 11/12/2020    The ASCVD Risk score (Arnett DK, et al., 2019) failed to calculate for the following reasons:   The valid total cholesterol range is 130 to 320 mg/dL  I reviewed the patients updated PMH, FH, and SocHx.    Patient Active Problem List   Diagnosis Date  Noted   Serrated adenoma of colon 09/27/2019    Priority: 1.   Mixed hyperlipidemia 06/02/2016    Priority: 1.   Essential hypertension 07/31/2013    Priority: 1.   History of lacunar cerebrovascular accident 09/27/2019    Priority: 2.   Bilateral primary osteoarthritis of knee 06/05/2017    Priority: 2.   History of vaginal dysplasia 08/31/2016    Priority: 2.   Diverticulosis     Priority: 3.   Sensorineural hearing loss (SNHL), bilateral 07/07/2015    Priority: 3.   Rectocele 05/30/2014    Priority: 3.   Prediabetes 11/11/2020    Priority: 1.   Malignant neoplasm of lower-outer quadrant of left breast of female, estrogen receptor positive ([place]) 09/07/2020     Priority: 1.   Blindness right eye category 3, blindness left eye category 3 09/09/2020    Priority: 2.   Umbilical hernia without obstruction and without gangrene 07/08/2020    Priority: 2.   Varicose veins of both lower extremities with pain 07/08/2020    Priority: 3.   Genetic testing 09/16/2020   Family history of breast cancer 09/10/2020   Family history of brain cancer 09/10/2020   Family history of uterine cancer 09/10/2020   Family history of pancreatic cancer 09/10/2020    Allergies: Patient has no known allergies.  Social History: Patient  reports that she has never smoked. She has never used smokeless tobacco. She reports that she does not drink alcohol and does not use drugs.  Current Meds  Medication Sig   losartan-hydrochlorothiazide (HYZAAR) 100-25 MG tablet Take 1 tablet by mouth daily.    Review of Systems: Cardiovascular: negative for chest pain, palpitations, leg swelling, orthopnea Respiratory: negative for SOB, wheezing or persistent cough Gastrointestinal: negative for abdominal pain Genitourinary: negative for dysuria or gross hematuria  Objective  Vitals: BP 120/74   Pulse 68   Temp 98 F (36.7 C) (Temporal)   Ht 5\' 1"  (1.549 m)   Wt 161 lb 12.8 oz (73.4 kg)   SpO2 99%   BMI 30.57 kg/m  General: no acute distress  Psych:  Alert and oriented, normal mood and affect HEENT:  Normocephalic, atraumatic, supple neck  Cardiovascular:  RRR with murmur. no edema Respiratory:  Good breath sounds bilaterally, CTAB with normal respiratory effort  Commons side effects, risks, benefits, and alternatives for medications and treatment plan prescribed today were discussed, and the patient expressed understanding of the given instructions. Patient is instructed to call or message via MyChart if he/she has any questions or concerns regarding our treatment plan. No barriers to understanding were identified. We discussed Red Flag symptoms and signs in detail.  Patient expressed understanding regarding what to do in case of urgent or emergency type symptoms.  Medication list was reconciled, printed and provided to the patient in AVS. Patient instructions and summary information was reviewed with the patient as documented in the AVS. This note was prepared with assistance of Dragon voice recognition software. Occasional wrong-word or sound-a-like substitutions may have occurred due to the inherent limitations of voice recognition software  This visit occurred during the SARS-CoV-2 public health emergency.  Safety protocols were in place, including screening questions prior to the visit, additional usage of staff PPE, and extensive cleaning of exam room while observing appropriate contact time as indicated for disinfecting solutions.

## 2021-01-09 IMAGING — US US BREAST*R* LIMITED INC AXILLA
1 series · 6 of 6 positions shown · non-contrast
Comparison: 06/11/2019 and earlier

CLINICAL DATA: Patient returns after screening study for evaluation
of possible RIGHT breast mass.

EXAM:
DIGITAL DIAGNOSTIC RIGHT MAMMOGRAM WITH CAD AND TOMO
ULTRASOUND RIGHT BREAST

[Series 1: us breast*right* limited inc axilla · 0.06mm/px · 6 of 6 slices shown]
[im 1/6]
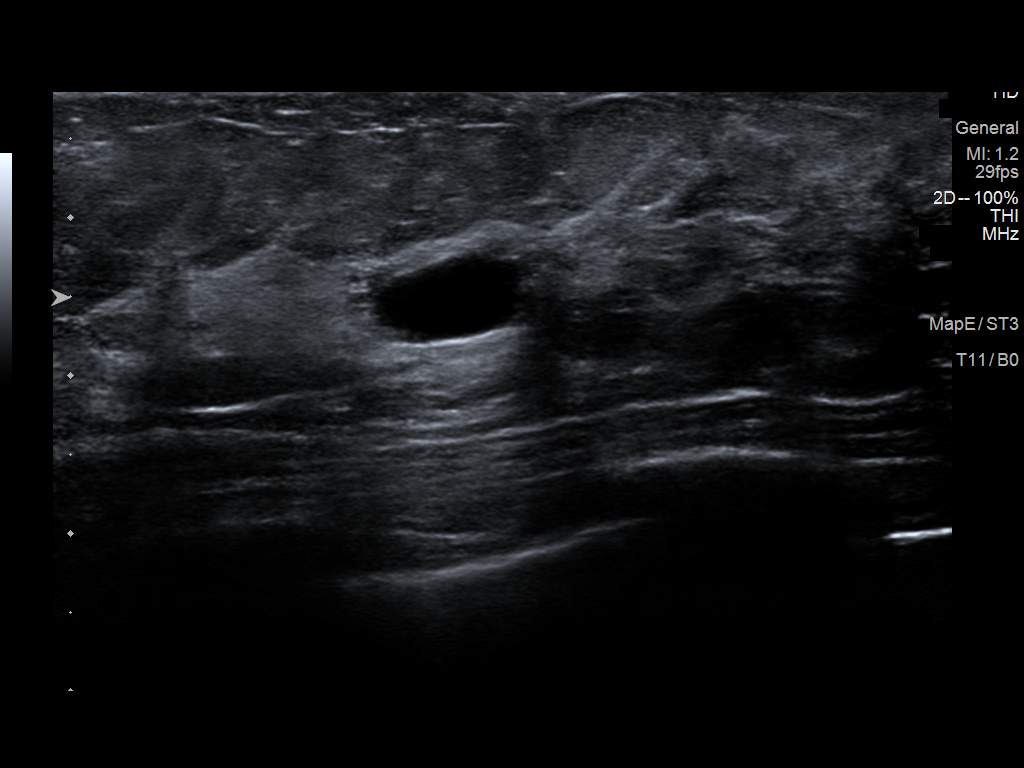
[im 2/6]
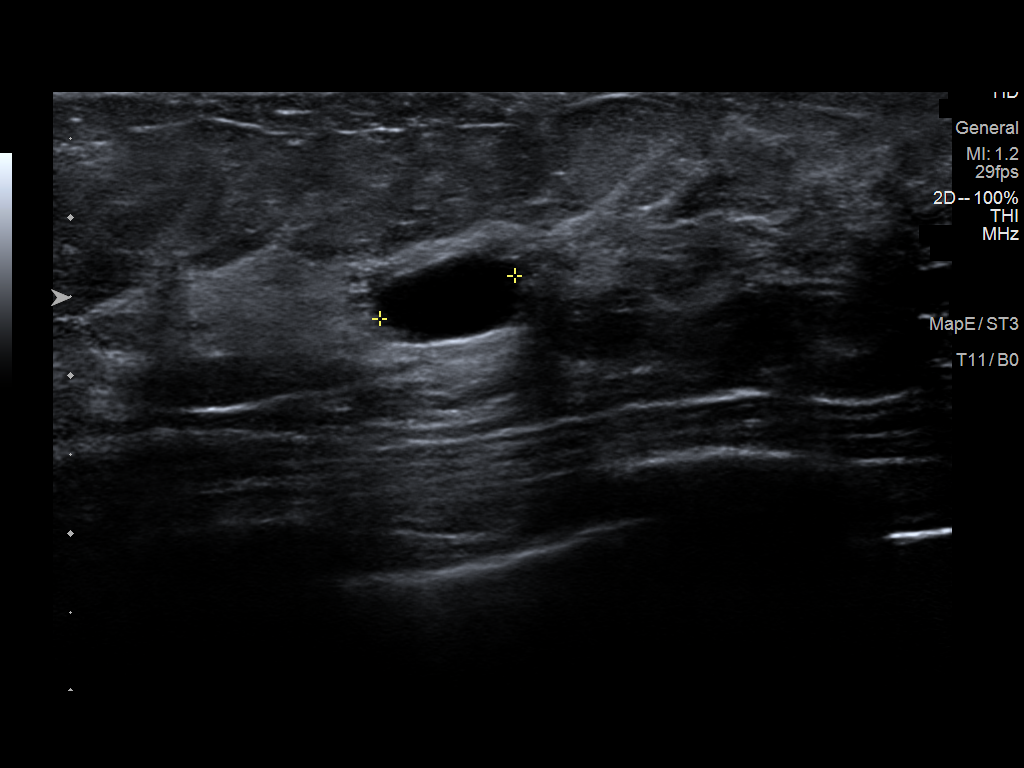
[im 3/6]
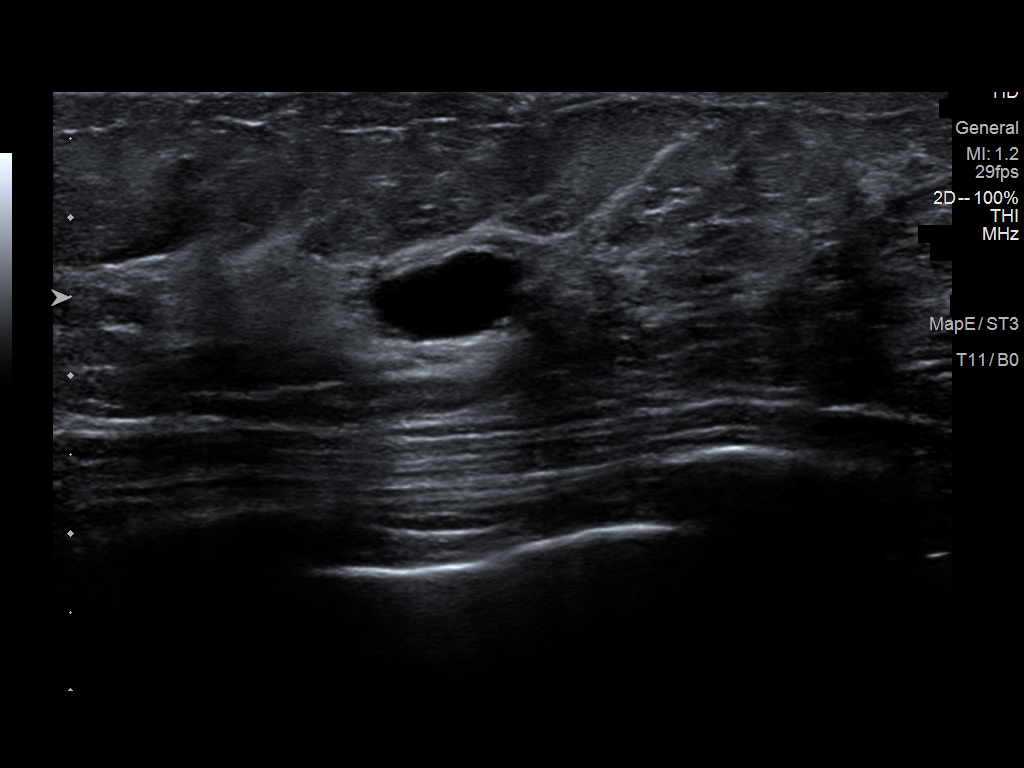
[im 4/6]
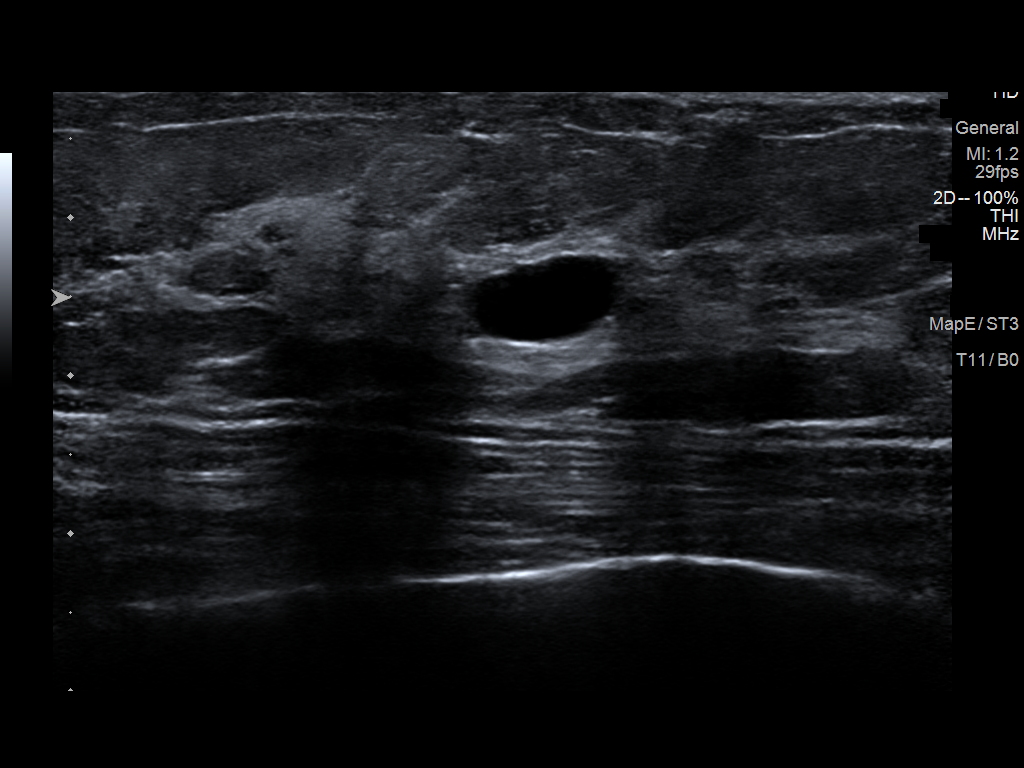
[im 5/6]
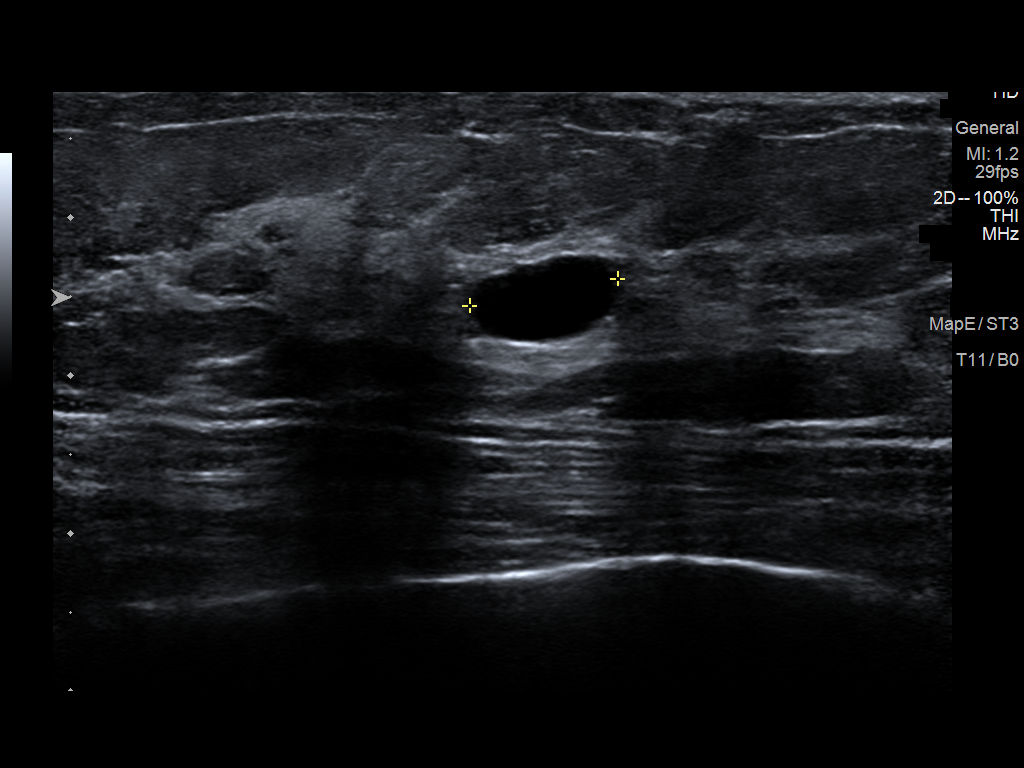
[im 6/6]
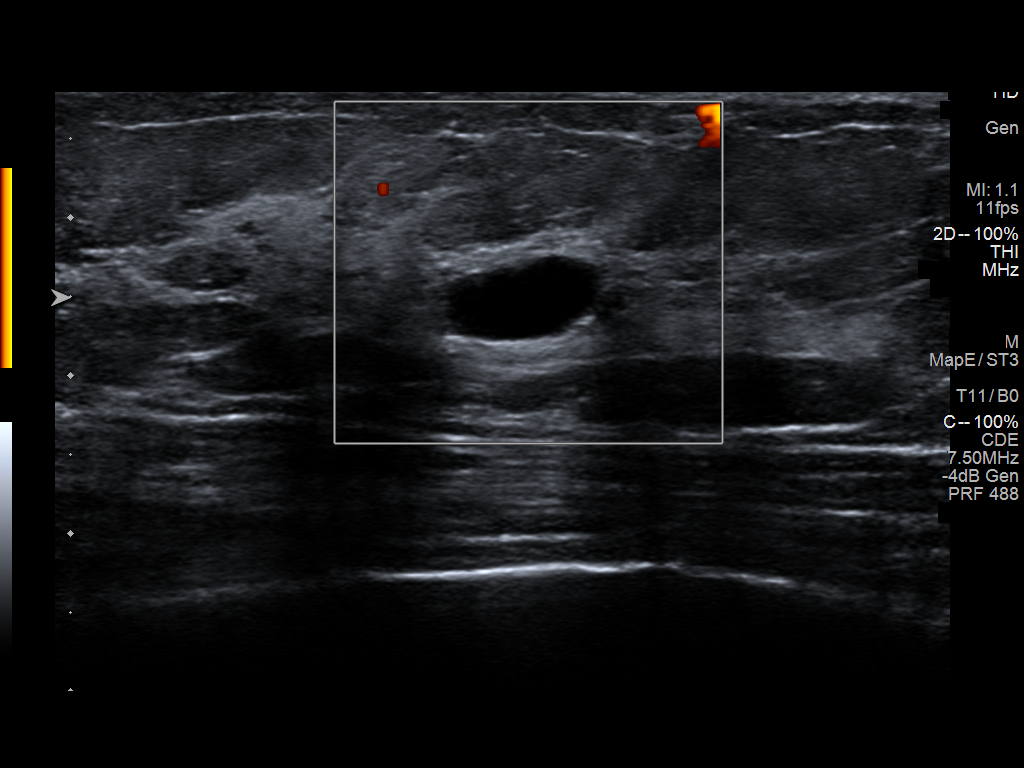

[6 of 6 positions shown; findings below may reference images not displayed]

ACR Breast Density Category c: The breast tissue is heterogeneously
dense, which may obscure small masses.
FINDINGS: Additional 2-D and 3-D images are performed. These views show a
circumscribed oval mass in the immediate retroareolar region of the
RIGHT breast.

Mammographic images were processed with CAD.

Targeted ultrasound is performed, showing a simple cyst in the
retroareolar 12 o'clock location of the RIGHT breast. Cyst is 0.9 x
1.0 x 0.5 centimeters. No solid mass or areas of acoustic shadowing
identified.
IMPRESSION: Benign cyst in the retroareolar region of the RIGHT breast. No
mammographic or ultrasound evidence for malignancy.

RECOMMENDATION:
Screening mammogram in one year.(Code:DO-K-91M)

I have discussed the findings and recommendations with the patient.
If applicable, a reminder letter will be sent to the patient
regarding the next appointment.

BI-RADS CATEGORY  2: Benign.

## 2021-01-09 IMAGING — MG MM DIGITAL DIAGNOSTIC UNILAT*R* W/ TOMO W/ CAD
4 series · 4 of 12 positions shown · non-contrast
Comparison: 06/11/2019 and earlier

CLINICAL DATA: Patient returns after screening study for evaluation
of possible RIGHT breast mass.

EXAM:
DIGITAL DIAGNOSTIC RIGHT MAMMOGRAM WITH CAD AND TOMO
ULTRASOUND RIGHT BREAST

[R CC synth-2D]
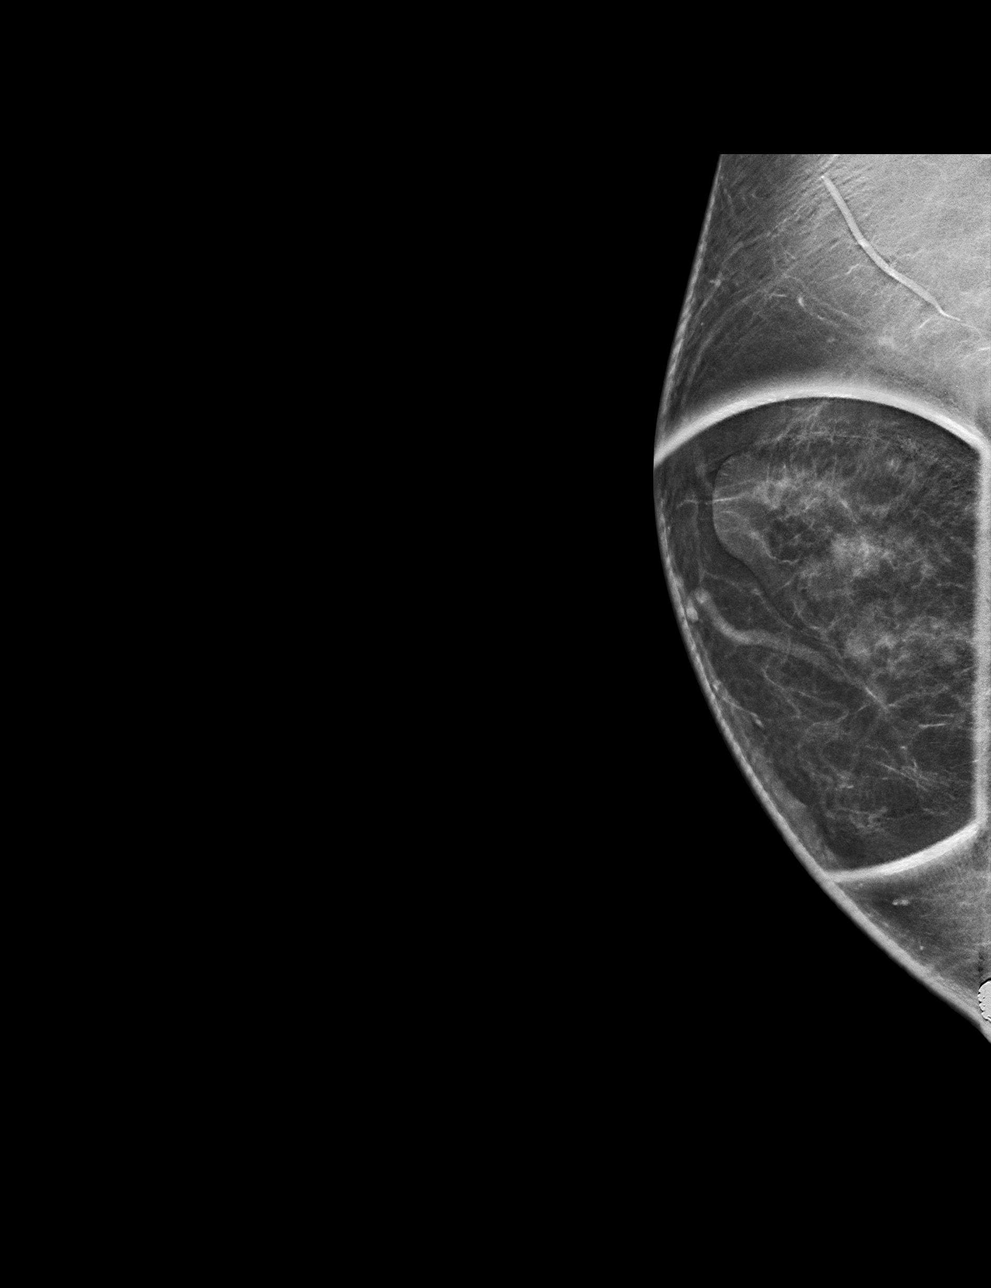

[R MLO synth-2D]
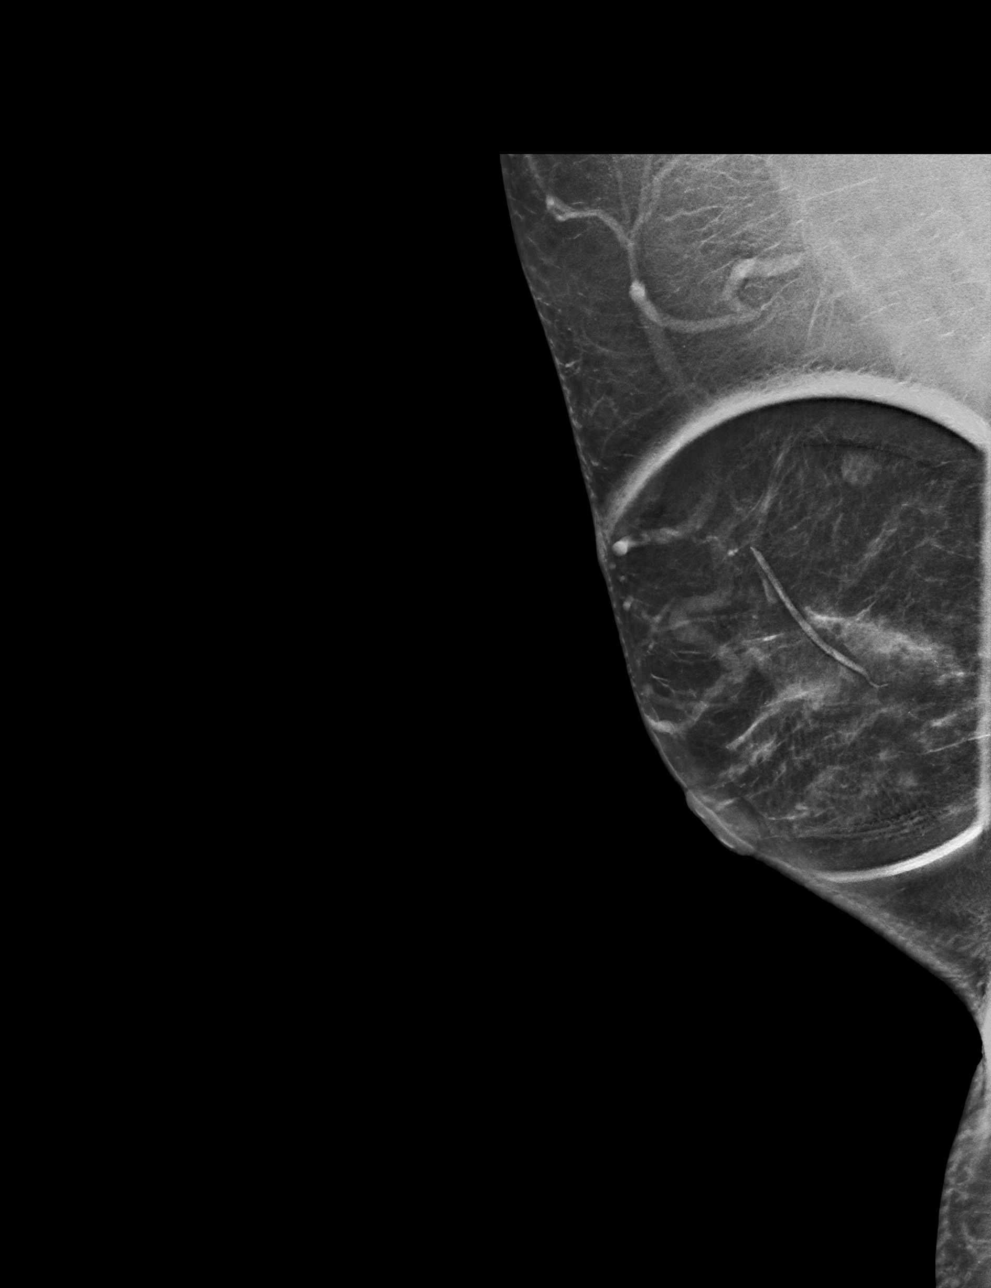

[R CC tomo · tomo slice 29/58.0]
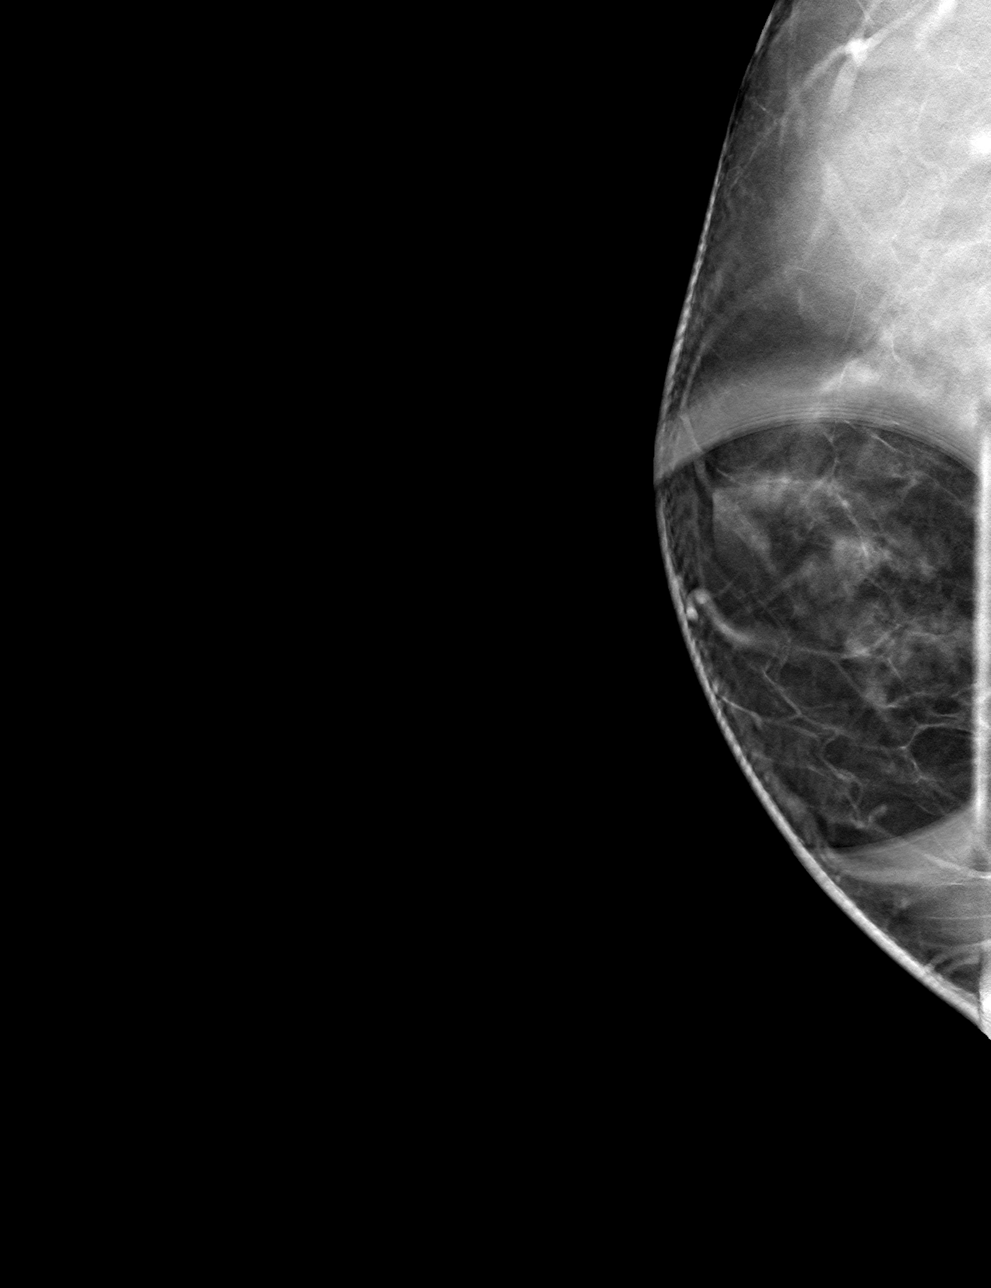

[R MLO tomo · tomo slice 29/58.0]
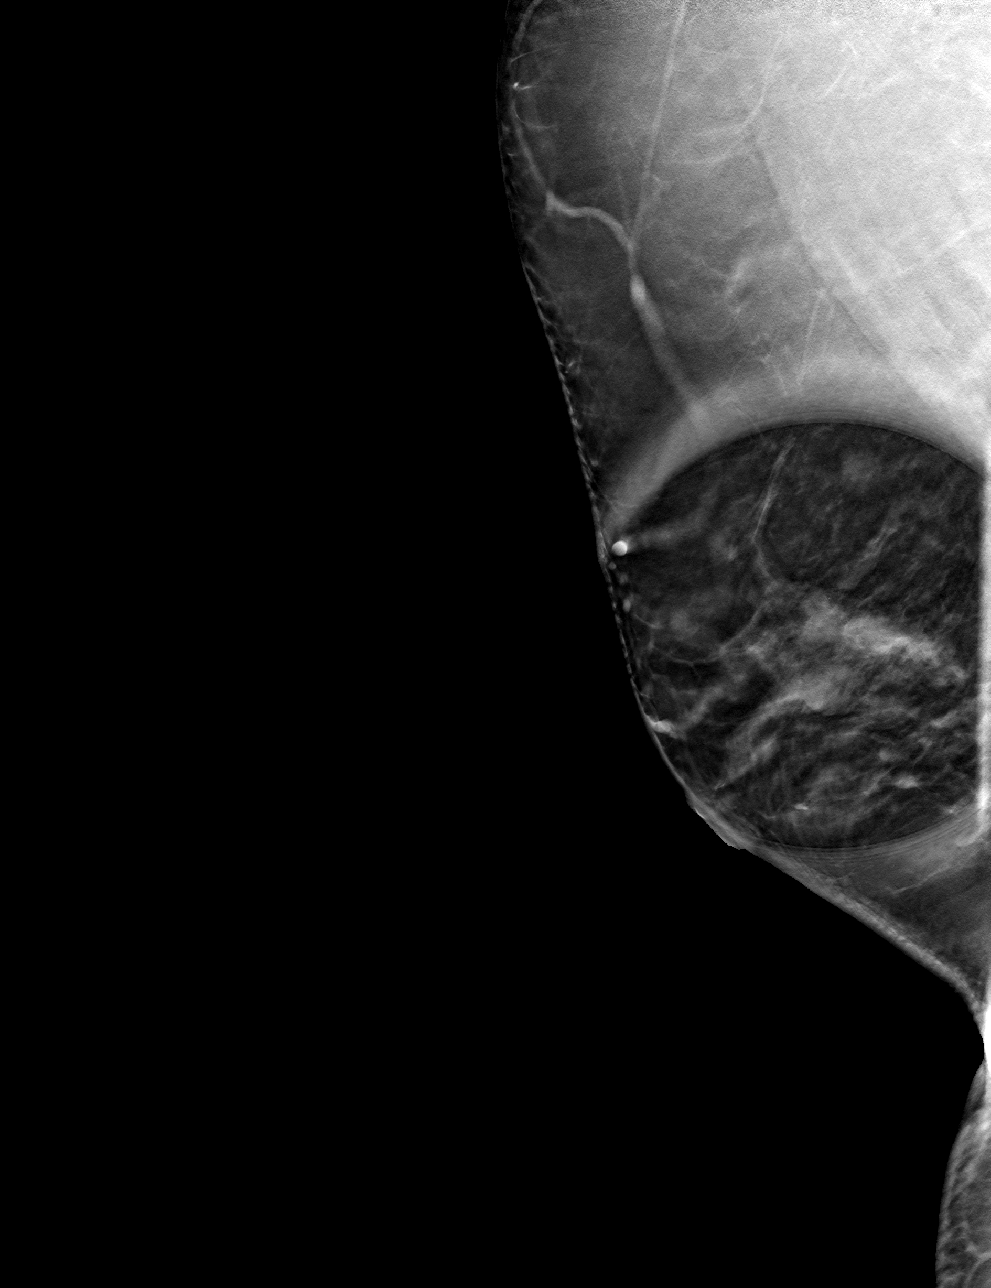

[4 of 12 positions shown; findings below may reference images not displayed]

ACR Breast Density Category c: The breast tissue is heterogeneously
dense, which may obscure small masses.
FINDINGS: Additional 2-D and 3-D images are performed. These views show a
circumscribed oval mass in the immediate retroareolar region of the
RIGHT breast.

Mammographic images were processed with CAD.

Targeted ultrasound is performed, showing a simple cyst in the
retroareolar 12 o'clock location of the RIGHT breast. Cyst is 0.9 x
1.0 x 0.5 centimeters. No solid mass or areas of acoustic shadowing
identified.
IMPRESSION: Benign cyst in the retroareolar region of the RIGHT breast. No
mammographic or ultrasound evidence for malignancy.

RECOMMENDATION:
Screening mammogram in one year.(Code:DO-K-91M)

I have discussed the findings and recommendations with the patient.
If applicable, a reminder letter will be sent to the patient
regarding the next appointment.

BI-RADS CATEGORY  2: Benign.

## 2021-01-11 ENCOUNTER — Encounter: Payer: Self-pay | Admitting: Radiation Oncology

## 2021-01-15 NOTE — Progress Notes (Incomplete)
  Radiation Oncology         [phone] ________________________________  Patient Name: Debra Simon MRN: [id number] DOB: January 01, 1951 Referring Physician: Lurline Del (Profile Not Attached) Date of Service: 12/17/2020 [facility], [place]  End Of Treatment Note  Diagnoses: C50.412-Malignant neoplasm of upper-outer quadrant of left female breast  Cancer Staging: Stage IA (cT1c, cN0, cM0) Left Breast LOQ, Invasive Ductal Carcinoma with intermediate grade DCIS, ER+ / PR+ / Her2-, Grade 2  Intent: Curative  Radiation Treatment Dates: 11/18/2020 through 12/17/2020 Site Technique Total Dose (Gy) Dose per Fx (Gy) Completed Fx Beam Energies  Breast, Left: Breast_Lt 3D 40.05/40.05 2.67 15/15 6XFFF  Breast, Left: Breast_Lt_Bst 3D 12/12 2 6/6 6X   Narrative: The patient tolerated radiation therapy relatively well. She denies pain, though reports mild swelling to left breast. Patient reports having a good energy level. Continues to have full range of motion to left arm. Reports itching, darkening, and a burning sensation to left breast.  On physical exam, the left breast area shows some hyperpigmentation changes and minimal erythema.   Plan: The patient will follow-up with radiation oncology in one month .  ________________________________________________ -----------------------------------  Blair Promise, PhD, MD  This document serves as a record of services personally performed by Gery Pray, MD. It was created on his behalf by Roney Mans, a trained medical scribe. The creation of this record is based on the scribe's personal observations and the provider's statements to them. This document has been checked and approved by the attending provider.

## 2021-01-16 NOTE — Progress Notes (Signed)
Radiation Oncology         [phone] ________________________________  Name: Debra Simon MRN: [id number]  Date: 01/18/2021  DOB: [birth year]  Follow-Up Visit Note  CC: Leamon Arnt, MD  Magrinat, Virgie Dad, MD    ICD-10-CM   1. Malignant neoplasm of lower-outer quadrant of left breast of female, estrogen receptor positive ([place])  C50.512    Z17.0       Diagnosis: Stage IA (cT1c, cN0, cM0) Left Breast LOQ, Invasive Ductal Carcinoma with intermediate grade DCIS, ER+ / PR+ / Her2-, Grade 2  Interval Since Last Radiation: 1 month and 2 days   Intent: Curative  Radiation Treatment Dates: 11/18/2020 through 12/17/2020 Site Technique Total Dose (Gy) Dose per Fx (Gy) Completed Fx Beam Energies  Breast, Left: Breast_Lt 3D 40.05/40.05 2.67 15/15 6XFFF  Breast, Left: Breast_Lt_Bst 3D 12/12 2 6/6 6X    Narrative:  The patient returns today for routine follow-up. She tolerated radiation treatment relatively well with the exception of reporting mild swelling, itching, darkening, and burning to the left breast. Physical exam performed on the day of her final treatment revealed hyperpigmentation changes and minimal erythema to the left breast.          Otherwise, no significant interval history since the patient was last seen for consultation.      sHe has some mild discomfort in the axillary area but no pain.  She denies any nipple discharge or bleeding.  She denies any problems with swelling in her left arm or hand.  She has started Arimidex and is tolerating this medication well                     Allergies:  has No Known Allergies.  Meds: Current Outpatient Medications  Medication Sig Dispense Refill   anastrozole (ARIMIDEX) 1 MG tablet Take 1 tablet (1 mg total) by mouth daily. Start December 26, 2020 90 tablet 4   aspirin EC 81 MG tablet Take 1 tablet (81 mg total) by mouth daily.     losartan-hydrochlorothiazide (HYZAAR) 100-25 MG tablet Take 1 tablet by mouth daily. 90 tablet 3    Multiple Vitamin (MULTIVITAMIN) tablet Take 1 tablet by mouth daily.     simvastatin (ZOCOR) 20 MG tablet Take 1 tablet (20 mg total) by mouth at bedtime. 90 tablet 3   No current facility-administered medications for this encounter.    Physical Findings: The patient is in no acute distress. Patient is alert and oriented.  height is $RemoveB'5\' 1"'DVtUJRuc$  (1.549 m) and weight is 163 lb 4 oz (74 kg). Her temporal temperature is 96.8 F (36 C) (abnormal). Her blood pressure is 146/77 (abnormal) and her pulse is 69. Her respiration is 18 and oxygen saturation is 99%. .  No significant changes. Lungs are clear to auscultation bilaterally. Heart has regular rate and rhythm. No palpable cervical, supraclavicular, or axillary adenopathy. Abdomen soft, non-tender, normal bowel sounds.  Left Breast: no palpable mass, nipple discharge or bleeding.  Mild radiation changes.  Skin is healed well.  Mild edema in the breast area.  No dominant mass appreciated breast nipple discharge or bleeding.   Lab Findings: Lab Results  Component Value Date   WBC 5.9 11/12/2020   HGB 13.4 11/12/2020   HCT 39.2 11/12/2020   MCV 87.1 11/12/2020   PLT 233 11/12/2020    Radiographic Findings: No results found.  Impression:  Stage IA (cT1c, cN0, cM0) Left Breast LOQ, Invasive Ductal Carcinoma with intermediate grade DCIS, ER+ /  PR+ / Her2-, Grade 2  The patient is recovering well from her radiation therapy at this point.  Minimal side effects.  No evidence of recurrence on clinical exam today.  Plan: As needed follow-up in radiation oncology.  Patient will continue close follow-up in medical oncology and continue on adjuvant hormonal therapy.   ________________________  Blair Promise, PhD, MD   This document serves as a record of services personally performed by Gery Pray, MD. It was created on his behalf by Roney Mans, a trained medical scribe. The creation of this record is based on the scribe's personal  observations and the provider's statements to them. This document has been checked and approved by the attending provider.

## 2021-01-18 ENCOUNTER — Ambulatory Visit: Payer: Medicare HMO | Attending: General Surgery

## 2021-01-18 ENCOUNTER — Other Ambulatory Visit: Payer: Self-pay

## 2021-01-18 ENCOUNTER — Encounter: Payer: Self-pay | Admitting: Radiation Oncology

## 2021-01-18 ENCOUNTER — Ambulatory Visit
Admission: RE | Admit: 2021-01-18 | Discharge: 2021-01-18 | Disposition: A | Discharge status: Home / Self Care | Payer: Medicare HMO | Source: Ambulatory Visit | Attending: Radiation Oncology | Admitting: Radiation Oncology

## 2021-01-18 DIAGNOSIS — C50512 Malignant neoplasm of lower-outer quadrant of left female breast: Secondary | ICD-10-CM | POA: Diagnosis not present

## 2021-01-18 DIAGNOSIS — Z923 Personal history of irradiation: Secondary | ICD-10-CM | POA: Diagnosis not present

## 2021-01-18 DIAGNOSIS — Z483 Aftercare following surgery for neoplasm: Secondary | ICD-10-CM | POA: Insufficient documentation

## 2021-01-18 DIAGNOSIS — Z170 Estrogen receptor positive status [ER+]: Secondary | ICD-10-CM | POA: Insufficient documentation

## 2021-01-18 DIAGNOSIS — Z79811 Long term (current) use of aromatase inhibitors: Secondary | ICD-10-CM | POA: Diagnosis not present

## 2021-01-18 HISTORY — DX: Personal history of irradiation: Z92.3

## 2021-01-18 NOTE — Progress Notes (Signed)
Evonda Enge is here today for follow up post radiation to the breast.   Breast Side:left    They completed their radiation on: 12/17/20  Does the patient complain of any of the following: Post radiation skin issues Patient reports skin is doing very well.  Breast Tenderness: reports having a burning sensation.  Breast Swelling: no Lymphadema: Patient currently in physical therapy. Wears compression sleeve to left arm at times.  Range of Motion limitations: full range of motion. Fatigue post radiation: Patient reports feeling much better.  Appetite good/fair/poor: Good, appetite is better in the morning.   Additional comments if applicable:   Vitals:   01/18/21 1501  BP: (!) 146/77  Pulse: 69  Resp: 18  Temp: (!) 96.8 F (36 C)  TempSrc: Temporal  SpO2: 99%  Weight: 163 lb 4 oz (74 kg)  Height: 5\' 1"  (1.549 m)

## 2021-01-18 NOTE — Therapy (Signed)
[facility] @ [facility] [address] [place], [place], [address] Phone: [phone]   Fax:  [phone]  Physical Therapy Treatment  Patient Details  Name: Debra Simon MRN: [id number] Date of Birth: 09/18/1950 Referring Provider (PT): Dr. Faera Byerly   Encounter Date: 01/18/2021   PT End of Session - 01/18/21 1303     Visit Number 2   # unchanged due to screen only   PT Start Time 1006    PT Stop Time 1011    PT Time Calculation (min) 5 min    Activity Tolerance Patient tolerated treatment well    Behavior During Therapy WFL for tasks assessed/performed             Past Medical History:  Diagnosis Date   Anxiety    Arthritis    Breast cancer (HCC)    Colon polyps 2010   Diastolic dysfunction, left ventricle 06/26/2016   (ECHO 2018): Left ventricle: The cavity size was normal. Systolic function was normal. The estimated ejection fraction was in the range of 60% to 65%. Wall motion was normal; there were no regional wall motion abnormalities. Doppler parameters are consistent with abnormal left ventricular relaxation (grade 1 diastolic dysfunction).   Diverticulosis    Family history of brain cancer    Family history of breast cancer    Family history of pancreatic cancer    Family history of uterine cancer    GERD (gastroesophageal reflux disease)    watches what she eats   Headache    gets headaches when she eats sweets   Heart murmur    dx last yr.   had echo 05/2016   History of radiation therapy    Left breast- 11/18/20-12/17/20- Dr. James Kinard   Hyperlipidemia    Hypertension    Mild mitral regurgitation 07/04/2016   Pneumonia    Pre-diabetes    Prediabetes 11/11/2020   Primary localized osteoarthritis of right knee 05/24/2017   Ureterovaginal fistula    Uterine fibroid 1994   Vaginal dysplasia     Past Surgical History:  Procedure Laterality Date   BREAST CYST ASPIRATION Right    BREAST  EXCISIONAL BIOPSY Right    BREAST LUMPECTOMY WITH RADIOACTIVE SEED AND SENTINEL LYMPH NODE BIOPSY Left 10/15/2020   Procedure: LEFT BREAST LUMPECTOMY WITH RADIOACTIVE SEED AND SENTINEL LYMPH NODE BIOPSY;  Surgeon: Byerly, Faera, MD;  Location: [facility] OR;  Service: General;  Laterality: Left;   COLONOSCOPY  2010   COLONOSCOPY  2012   COLONOSCOPY  2016   CYSTOSCOPY     ESOPHAGOGASTRODUODENOSCOPY  2010   TOTAL ABDOMINAL HYSTERECTOMY  1994   TOTAL KNEE ARTHROPLASTY Right 06/05/2017   TOTAL KNEE ARTHROPLASTY Right 06/05/2017   Procedure: RIGHT TOTAL KNEE ARTHROPLASTY;  Surgeon: Wainer, Robert, MD;  Location: [facility] OR;  Service: Orthopedics;  Laterality: Right;   Ueterovaginal fistual repair     URETERAL EXPLORATION  04/16/1992   URETEROTOMY ON THE RIGHT, REPAIR LACERATION ON THE LEFT    There were no vitals filed for this visit.   Subjective Assessment - 01/18/21 1031     Subjective Pt returns for her 3 month L-Dex screen.    Pertinent History Patient was diagnosed on 08/28/2020 with left grade I-II invasive ductal carcinoma breast cancer. She had a left lumpectomy and sentinel node biopsy on 10/15/2020 with 5 negative axillary nodes removed. It is ER/PR positive and HER2 negative with a Ki67 of 15%.                      L-DEX FLOWSHEETS - 01/18/21 1000       L-DEX LYMPHEDEMA SCREENING   Measurement Type Unilateral    L-DEX MEASUREMENT EXTREMITY Upper Extremity    POSITION  Standing    DOMINANT SIDE Right    At Risk Side Left    BASELINE SCORE (UNILATERAL) 2.3    L-DEX SCORE (UNILATERAL) 5.8    VALUE CHANGE (UNILAT) 3.5                                     PT Long Term Goals - 11/16/20 1347       PT LONG TERM GOAL #1   Title Patient will demonstrate she has regained full shoulder ROM and function post operatively compared to baselines.    Time 8    Period Weeks    Status Partially Met                   Plan - 01/18/21 1304     Clinical  Impression Statement Pt returns for her 3 month L-Dex screen. Her change from baseline of 3.5 is WNLs so no further treatment is required at this time except to cont with every 3 month L-Dex screens which pt is agreeable to.    PT Next Visit Plan Cont every 3 month L-Dex screens for up to 2 years from her SLNB (~10/16/22)    Consulted and Agree with Plan of Care Patient;Other (Comment)   interpreter present            Patient will benefit from skilled therapeutic intervention in order to improve the following deficits and impairments:     Visit Diagnosis: Aftercare following surgery for neoplasm     Problem List Patient Active Problem List   Diagnosis Date Noted   Prediabetes 11/11/2020   Genetic testing 09/16/2020   Family history of breast cancer 09/10/2020   Family history of brain cancer 09/10/2020   Family history of uterine cancer 09/10/2020   Family history of pancreatic cancer 09/10/2020   Blindness right eye category 3, blindness left eye category 3 09/09/2020   Malignant neoplasm of lower-outer quadrant of left breast of female, estrogen receptor positive ([place]) 09/07/2020   Varicose veins of both lower extremities with pain 42/59/5638   Umbilical hernia without obstruction and without gangrene 07/08/2020   History of lacunar cerebrovascular accident 09/27/2019   Serrated adenoma of colon 09/27/2019   Bilateral primary osteoarthritis of knee 06/05/2017   Diverticulosis    History of vaginal dysplasia 08/31/2016   Mixed hyperlipidemia 06/02/2016   Sensorineural hearing loss (SNHL), bilateral 07/07/2015   Rectocele 05/30/2014   Essential hypertension 07/31/2013    [name], PTA 01/18/2021, 1:06 PM  [facility] [place], [place], [address] Phone: [phone]   Fax:  [phone]  Name: Raul Winterhalter MRN: [id number] Date of Birth: [birth year]

## 2021-01-19 ENCOUNTER — Telehealth: Payer: Self-pay | Admitting: *Deleted

## 2021-01-21 ENCOUNTER — Inpatient Hospital Stay: Payer: Medicare HMO | Attending: Oncology | Admitting: Adult Health

## 2021-01-21 ENCOUNTER — Other Ambulatory Visit: Payer: Self-pay

## 2021-01-21 ENCOUNTER — Inpatient Hospital Stay: Payer: Medicare HMO

## 2021-01-21 ENCOUNTER — Encounter: Payer: Self-pay | Admitting: Adult Health

## 2021-01-21 VITALS — BP 155/67 | HR 74 | Temp 97.8°F | Resp 18 | Ht 61.0 in | Wt 161.0 lb

## 2021-01-21 DIAGNOSIS — E2839 Other primary ovarian failure: Secondary | ICD-10-CM | POA: Diagnosis not present

## 2021-01-21 DIAGNOSIS — C50512 Malignant neoplasm of lower-outer quadrant of left female breast: Secondary | ICD-10-CM | POA: Diagnosis not present

## 2021-01-21 DIAGNOSIS — Z923 Personal history of irradiation: Secondary | ICD-10-CM | POA: Diagnosis not present

## 2021-01-21 DIAGNOSIS — Z170 Estrogen receptor positive status [ER+]: Secondary | ICD-10-CM | POA: Insufficient documentation

## 2021-01-21 NOTE — Progress Notes (Signed)
SURVIVORSHIP VISIT:   BRIEF ONCOLOGIC HISTORY:  Oncology History  Malignant neoplasm of lower-outer quadrant of left breast of female, estrogen receptor positive ([place])  09/03/2020 Initial Diagnosis   70 y.o. [place] woman s/p Left breast lower outer quadrant biopsy 09/03/2020 for a clinical T1c N0, stage IA invasive ductal carcinoma, grade I-II, estrogen and progesterone receptor positive, HER2 not amplified, with an Mib-1 of 15%   09/08/2020 Cancer Staging   Staging form: Breast, AJCC 8th Edition - Clinical: Stage IA (cT1c, cN0, cM0, G2, ER+, PR+, HER2-) - Signed by Chauncey Cruel, MD on 09/08/2020 Histologic grading system: 3 grade system    09/15/2020 Genetic Testing   Negative genetic testing:  No pathogenic variants detected on the [employer] BRCAplus panel (report date 09/15/2020) or the CancerNext-Expanded + RNAinsight panel (report date 09/19/2020).    The BRCAplus panel offered by [employer] and includes sequencing and deletion/duplication analysis for the following 8 genes: ATM, BRCA1, BRCA2, CDH1, CHEK2, PALB2, PTEN, and TP53. The CancerNext-Expanded + RNAinsight gene panel offered by [employer] and includes sequencing and rearrangement analysis for the following 77 genes: AIP, ALK, APC, ATM, AXIN2, BAP1, BARD1, BLM, BMPR1A, BRCA1, BRCA2, BRIP1, CDC73, CDH1, CDK4, CDKN1B, CDKN2A, CHEK2, CTNNA1, DICER1, FANCC, FH, FLCN, GALNT12, KIF1B, LZTR1, MAX, MEN1, MET, MLH1, MSH2, MSH3, MSH6, MUTYH, NBN, NF1, NF2, NTHL1, PALB2, PHOX2B, PMS2, POT1, PRKAR1A, PTCH1, PTEN, RAD51C, RAD51D, RB1, RECQL, RET, SDHA, SDHAF2, SDHB, SDHC, SDHD, SMAD4, SMARCA4, SMARCB1, SMARCE1, STK11, SUFU, TMEM127, TP53, TSC1, TSC2, VHL and XRCC2 (sequencing and deletion/duplication); EGFR, EGLN1, HOXB13, KIT, MITF, PDGFRA, POLD1 and POLE (sequencing only); EPCAM and GREM1 (deletion/duplication only). RNA data is routinely analyzed for use in variant interpretation for all genes.   10/15/2020 Surgery   status post left  lumpectomy and sentinel lymph node sampling 10/15/2020 for a pT1c pN0, stage IA invasive ductal carcinoma, grade 2, with negative margins (A) a total of 5 left axillary lymph nodes were removed   10/15/2020 Oncotype testing   Oncotype score of 15 predicts a risk of recurrence outside the breast in the next 9 years of 4% if the patient's only systemic therapy is antiestrogens for 5 years.  It also predicts no benefit from chemotherapy   11/18/2020 - 12/17/2020 Radiation Therapy      12/2020 -  Anti-estrogen oral therapy   Anastrozole daily; bone density at [facility] 04/18/2017 was normal     INTERVAL HISTORY:  Ms. Fellman to review her survivorship care plan detailing her treatment course for breast cancer, as well as monitoring long-term side effects of that treatment, education regarding health maintenance, screening, and overall wellness and health promotion.     Overall, Ms. Wyszynski reports feeling quite well.  She is taking Anastrozole daily with good tolerance.  She is accompanied by her daughter who speaks [place].    REVIEW OF SYSTEMS:  Review of Systems  Constitutional:  Negative for appetite change, chills, fatigue, fever and unexpected weight change.  HENT:   Negative for hearing loss, lump/mass and trouble swallowing.   Eyes:  Negative for eye problems and icterus.  Respiratory:  Negative for chest tightness, cough and shortness of breath.   Cardiovascular:  Negative for chest pain, leg swelling and palpitations.  Gastrointestinal:  Negative for abdominal distention, abdominal pain, constipation, diarrhea, nausea and vomiting.  Endocrine: Negative for hot flashes.  Genitourinary:  Negative for difficulty urinating.   Musculoskeletal:  Negative for arthralgias.  Skin:  Negative for itching and rash.  Neurological:  Negative for dizziness,  extremity weakness, headaches and numbness.  Hematological:  Negative for adenopathy. Does not bruise/bleed easily.   Psychiatric/Behavioral:  Negative for depression. The patient is not nervous/anxious.   Breast: Denies any new nodularity, masses, tenderness, nipple changes, or nipple discharge.      ONCOLOGY TREATMENT TEAM:  1. Surgeon:  Dr. Barry Dienes at [facility] 2. Medical Oncologist: Dr. Jana Hakim  3. Radiation Oncologist: Dr. Sondra Come    PAST MEDICAL/SURGICAL HISTORY:  Past Medical History:  Diagnosis Date   Anxiety    Arthritis    Breast cancer [facility])    Colon polyps 6629   Diastolic dysfunction, left ventricle 06/26/2016   (ECHO 2018): Left ventricle: The cavity size was normal. Systolic function was normal. The estimated ejection fraction was in the range of 60% to 65%. Wall motion was normal; there were no regional wall motion abnormalities. Doppler parameters are consistent with abnormal left ventricular relaxation (grade 1 diastolic dysfunction).   Diverticulosis    Family history of brain cancer    Family history of breast cancer    Family history of pancreatic cancer    Family history of uterine cancer    GERD (gastroesophageal reflux disease)    watches what she eats   Headache    gets headaches when she eats sweets   Heart murmur    dx last yr.   had echo 05/2016   History of radiation therapy    Left breast- 11/18/20-12/17/20- Dr. Gery Pray   Hyperlipidemia    Hypertension    Mild mitral regurgitation 07/04/2016   Pneumonia    Pre-diabetes    Prediabetes 11/11/2020   Primary localized osteoarthritis of right knee 05/24/2017   Ureterovaginal fistula    Uterine fibroid 1994   Vaginal dysplasia    Past Surgical History:  Procedure Laterality Date   BREAST CYST ASPIRATION Right    BREAST EXCISIONAL BIOPSY Right    BREAST LUMPECTOMY WITH RADIOACTIVE SEED AND SENTINEL LYMPH NODE BIOPSY Left 10/15/2020   Procedure: LEFT BREAST LUMPECTOMY WITH RADIOACTIVE SEED AND SENTINEL LYMPH NODE BIOPSY;  Surgeon: Stark Klein, MD;  Location: [place];  Service: General;   Laterality: Left;   COLONOSCOPY  2010   COLONOSCOPY  2012   COLONOSCOPY  2016   CYSTOSCOPY     ESOPHAGOGASTRODUODENOSCOPY  2010   TOTAL ABDOMINAL HYSTERECTOMY  1994   TOTAL KNEE ARTHROPLASTY Right 06/05/2017   TOTAL KNEE ARTHROPLASTY Right 06/05/2017   Procedure: RIGHT TOTAL KNEE ARTHROPLASTY;  Surgeon: Elsie Saas, MD;  Location: [place];  Service: Orthopedics;  Laterality: Right;   Ueterovaginal fistual repair     URETERAL EXPLORATION  04/16/1992   URETEROTOMY ON THE RIGHT, REPAIR LACERATION ON THE LEFT     ALLERGIES:  No Known Allergies   CURRENT MEDICATIONS:  Outpatient Encounter Medications as of 01/21/2021  Medication Sig Note   anastrozole (ARIMIDEX) 1 MG tablet Take 1 tablet (1 mg total) by mouth daily. Start December 26, 2020 12/11/2020: Starting on 12/26/20   aspirin EC 81 MG tablet Take 1 tablet (81 mg total) by mouth daily.    losartan-hydrochlorothiazide (HYZAAR) 100-25 MG tablet Take 1 tablet by mouth daily.    Multiple Vitamin (MULTIVITAMIN) tablet Take 1 tablet by mouth daily.    simvastatin (ZOCOR) 20 MG tablet Take 1 tablet (20 mg total) by mouth at bedtime.    No facility-administered encounter medications on file as of 01/21/2021.     ONCOLOGIC FAMILY HISTORY:  Family History  Problem Relation Age of Onset   Diabetes  Mother    Hypertension Mother    Brain cancer Father        d. 32   Hypertension Sister    Diabetes Sister    Diabetes Brother    Breast cancer Paternal Aunt        dx >50   Uterine cancer Paternal Aunt        dx >50   Cancer Maternal Grandmother        unknown type, died in her 33s   Pancreatic cancer Cousin 26       maternal first cousin   Uterine cancer Cousin        dx 72s, maternal first cousin   Breast cancer Cousin 59       paternal first cousin   CAD Neg Hx    Stroke Neg Hx    Colon cancer Neg Hx    Colon polyps Neg Hx    Esophageal cancer Neg Hx    Stomach cancer Neg Hx    Rectal cancer Neg Hx      GENETIC  COUNSELING/TESTING: Negative  SOCIAL HISTORY:  Social History   Socioeconomic History   Marital status: Married    Spouse name: Not on file   Number of children: Not on file   Years of education: Not on file   Highest education level: GED or equivalent  Occupational History   Occupation: Housewife  Tobacco Use   Smoking status: Never   Smokeless tobacco: Never  Vaping Use   Vaping Use: Never used  Substance and Sexual Activity   Alcohol use: No    Alcohol/week: 0.0 standard drinks    Comment: rare on holidays   Drug use: No   Sexual activity: Never  Other Topics Concern   Not on file  Social History Narrative   Not on file   Social Determinants of Health   Financial Resource Strain: Low Risk    Difficulty of Paying Living Expenses: Not hard at all  Food Insecurity: No Food Insecurity   Worried About Charity fundraiser in the Last Year: Never true   Ran Out of Food in the Last Year: Never true  Transportation Needs: No Transportation Needs   Lack of Transportation (Medical): No   Lack of Transportation (Non-Medical): No  Physical Activity: Sufficiently Active   Days of Exercise per Week: 6 days   Minutes of Exercise per Session: 50 min  Stress: No Stress Concern Present   Feeling of Stress : Not at all  Social Connections: Moderately Integrated   Frequency of Communication with Friends and Family: More than three times a week   Frequency of Social Gatherings with Friends and Family: More than three times a week   Attends Religious Services: More than 4 times per year   Active Member of [employer] or Organizations: No   Attends Music therapist: Never   Marital Status: Married  Human resources officer Violence: Not At Risk   Fear of Current or Ex-Partner: No   Emotionally Abused: No   Physically Abused: No   Sexually Abused: No     OBSERVATIONS/OBJECTIVE:  BP (!) 155/67 (BP Location: Right Arm, Patient Position: Sitting)   Pulse 74   Temp 97.8 F (36.6  C) (Temporal)   Resp 18   Ht 5' 1" (1.549 m)   Wt 161 lb (73 kg)   SpO2 (!) 18%   BMI 30.42 kg/m  GENERAL: Patient is a well appearing female in no acute distress HEENT:  Sclerae  anicteric.  Oropharynx clear and moist. No ulcerations or evidence of oropharyngeal candidiasis. Neck is supple.  NODES:  No cervical, supraclavicular, or axillary lymphadenopathy palpated.  BREAST EXAM:  Deferred-she will undergo breast exam with Dr. Jana Hakim next month LUNGS:  Clear to auscultation bilaterally.  No wheezes or rhonchi. HEART:  Regular rate and rhythm. No murmur appreciated. ABDOMEN:  Soft, nontender.  Positive, normoactive bowel sounds. No organomegaly palpated. MSK:  No focal spinal tenderness to palpation. Full range of motion bilaterally in the upper extremities. EXTREMITIES:  No peripheral edema.   SKIN:  Clear with no obvious rashes or skin changes. No nail dyscrasia. NEURO:  Nonfocal. Well oriented.  Appropriate affect.   LABORATORY DATA:  None for this visit.  DIAGNOSTIC IMAGING:  None for this visit.      ASSESSMENT AND PLAN:  Ms.. Stringfellow is a pleasant 70 y.o. female with Stage IA left breast invasive ductal carcinoma, ER+/PR+/HER2-, diagnosed in 08/2020, treated with lumpectomy, adjuvant radiation therapy, and anti-estrogen therapy with Anastrozole beginning in 12/2020.  She presents to the Survivorship Clinic for our initial meeting and routine follow-up post-completion of treatment for breast cancer.    1. Stage IA right/left breast cancer:  Ms. Steinhaus is continuing to recover from definitive treatment for breast cancer. She will follow-up with her medical oncologist, Dr. Jana Hakim in 01/2021 with history and physical exam per surveillance protocol.  She will continue her anti-estrogen therapy. Her mammogram is due 07/2021; orders placed today. Today, a comprehensive survivorship care plan and treatment summary was reviewed with the patient today detailing her breast cancer diagnosis,  treatment course, potential late/long-term effects of treatment, appropriate follow-up care with recommendations for the future, and patient education resources.  A copy of this summary, along with a letter will be sent to the patient's primary care provider via mail/fax/In Basket message after today's visit.    2. Bone health:  Given Ms. Tomasik's age/history of breast cancer and her current treatment regimen including anti-estrogen therapy with Anastrozole, she is at risk for bone demineralization.  Her last DEXA scan was 2019, we are repeating a test in 2023 with her mammogram.  She was given education on specific activities to promote bone health.  3. Cancer screening:  Due to Ms. Umholtz's history and her age, she should receive screening for skin cancers, colon cancer, and gynecologic cancers.  The information and recommendations are listed on the patient's comprehensive care plan/treatment summary and were reviewed in detail with the patient.    4. Health maintenance and wellness promotion: Ms. Berton was encouraged to consume 5-7 servings of fruits and vegetables per day. We reviewed the "Nutrition Rainbow" handout.  She was also encouraged to engage in moderate to vigorous exercise for 30 minutes per day most days of the week. We discussed the [employer] fitness program, which is designed for cancer survivors to help them become more physically fit after cancer treatments.  She was instructed to limit her alcohol consumption and continue to abstain from tobacco use.  We are giving her the COVID booster today.   5. Support services/counseling: It is not uncommon for this period of the patient's cancer care trajectory to be one of many emotions and stressors.  She was given information regarding our available services and encouraged to contact me with any questions or for help enrolling in any of our support group/programs.    Follow up instructions:    -Return to cancer center 02/16/2021 for f/u  with Dr. Jana Hakim  -Mammogram  due in 07/2021 -Bone density 07/2021 -Follow up with Dr. Barry Dienes 07/2021 -Follow up with Dr. Chryl Heck 01/2022 -She is welcome to return back to the Survivorship Clinic at any time; no additional follow-up needed at this time.  -Consider referral back to survivorship as a long-term survivor for continued surveillance  The patient was provided an opportunity to ask questions and all were answered. The patient agreed with the plan and demonstrated an understanding of the instructions.   Total encounter time: 30 minutes in face to face visit time, chart review, lab review, order entry, care coordination, and documentation of the encounter.   Wilber Bihari, NP 01/21/21 9:41 AM Medical Oncology and [facility] [place], [place] [address] Tel. [phone]    Fax. [phone]  *Total Encounter Time as defined by the Centers for Medicare and Medicaid Services includes, in addition to the face-to-face time of a patient visit (documented in the note above) non-face-to-face time: obtaining and reviewing outside history, ordering and reviewing medications, tests or procedures, care coordination (communications with other health care professionals or caregivers) and documentation in the medical record.

## 2021-02-15 NOTE — Progress Notes (Signed)
[facility]  Telephone:([phone]) [phone] [phone]     ID: Debra Simon DOB: [birth year]  MR#: [id number]  MLY#:[id number]  Patient Care Team: Leamon Arnt, MD as PCP - General (Family Medicine) Elsie Saas, MD as Consulting Physician (Orthopedic Surgery) Princess Bruins, MD as Consulting Physician (Obstetrics and Gynecology) Stark Klein, MD as Consulting Physician (General Surgery) Journi Moffa, Virgie Dad, MD as Consulting Physician (Oncology) Gery Pray, MD as Consulting Physician (Radiation Oncology) Delice Bison, Charlestine Massed, NP as Nurse Practitioner (Hematology and Oncology) Harmon Pier, RN as Registered Nurse Aurea Graff OTHER MD:  CHIEF COMPLAINT: estrogen receptor positive breast cancer  CURRENT TREATMENT: Anastrozole   INTERVAL HISTORY: Debra Simon returns today for follow up of her estrogen receptor positive breast cancer.   Since her last visit, she received adjuvant radiation treatment from 11/18/2020 through 12/17/2020 under Dr. Sondra Come.  She tolerated this remarkably well, with minimal changes in the skin and no significant fatigue.  She began anastrozole in 12/2020.  She is not having significant issues with vaginal dryness or hot flashes so far.  She has not experienced any arthralgias or myalgias   REVIEW OF SYSTEMS: Debra Simon walks 30 minutes every morning before breakfast.  Sometimes she feels a little bit of nausea after taking her morning medications.  A detailed review of systems today was otherwise noncontributory   COVID 19 VACCINATION STATUS: fully vaccinated, Moderna x2 with [place] booster 02/2020 as of August 2022   HISTORY OF CURRENT ILLNESS: From the original intake note:  Debra Simon had routine screening mammography on 08/06/2020 showing a possible abnormality in the left breast. She underwent left diagnostic mammography with tomography and left breast ultrasonography at [facility] on 08/28/2020 showing:  breast density category C; 11 mm left breast mass at 6 o'clock, retroareolar; no left axillary lymphadenopathy.  Accordingly on 09/03/2020 she proceeded to biopsy of the left breast area in question. The pathology from this procedure ([id number]) showed: invasive ductal carcinoma, grade 1-2; ductal carcinoma in situ. Prognostic indicators significant for: estrogen receptor, >95% positive and progesterone receptor, >95% positive, both with strong staining intensity. Proliferation marker Ki67 at 15%. HER2 negative by immunohistochemistry (1+).   Cancer Staging  Malignant neoplasm of lower-outer quadrant of left breast of female, estrogen receptor positive ([place]) Staging form: Breast, AJCC 8th Edition - Clinical: Stage IA (cT1c, cN0, cM0, G2, ER+, PR+, HER2-) - Signed by Chauncey Cruel, MD on 09/08/2020 Histologic grading system: 3 grade system  The patient's subsequent history is as detailed below.   PAST MEDICAL HISTORY: Past Medical History:  Diagnosis Date   Anxiety    Arthritis    Breast cancer ([place])    Colon polyps 1749   Diastolic dysfunction, left ventricle 06/26/2016   (ECHO 2018): Left ventricle: The cavity size was normal. Systolic function was normal. The estimated ejection fraction was in the range of 60% to 65%. Wall motion was normal; there were no regional wall motion abnormalities. Doppler parameters are consistent with abnormal left ventricular relaxation (grade 1 diastolic dysfunction).   Diverticulosis    Family history of brain cancer    Family history of breast cancer    Family history of pancreatic cancer    Family history of uterine cancer    GERD (gastroesophageal reflux disease)    watches what she eats   Headache    gets headaches when she eats sweets   Heart murmur    dx last yr.   had echo 05/2016   History of radiation  therapy    Left breast- 11/18/20-12/17/20- Dr. Gery Pray   Hyperlipidemia    Hypertension    Mild mitral regurgitation 07/04/2016    Pneumonia    Pre-diabetes    Prediabetes 11/11/2020   Primary localized osteoarthritis of right knee 05/24/2017   Ureterovaginal fistula    Uterine fibroid 1994   Vaginal dysplasia     PAST SURGICAL HISTORY: Past Surgical History:  Procedure Laterality Date   BREAST CYST ASPIRATION Right    BREAST EXCISIONAL BIOPSY Right    BREAST LUMPECTOMY WITH RADIOACTIVE SEED AND SENTINEL LYMPH NODE BIOPSY Left 10/15/2020   Procedure: LEFT BREAST LUMPECTOMY WITH RADIOACTIVE SEED AND SENTINEL LYMPH NODE BIOPSY;  Surgeon: Stark Klein, MD;  Location: [facility];  Service: General;  Laterality: Left;   COLONOSCOPY  2010   COLONOSCOPY  2012   COLONOSCOPY  2016   CYSTOSCOPY     ESOPHAGOGASTRODUODENOSCOPY  2010   TOTAL ABDOMINAL HYSTERECTOMY  1994   TOTAL KNEE ARTHROPLASTY Right 06/05/2017   TOTAL KNEE ARTHROPLASTY Right 06/05/2017   Procedure: RIGHT TOTAL KNEE ARTHROPLASTY;  Surgeon: Elsie Saas, MD;  Location: [facility];  Service: Orthopedics;  Laterality: Right;   Ueterovaginal fistual repair     URETERAL EXPLORATION  04/16/1992   URETEROTOMY ON THE RIGHT, REPAIR LACERATION ON THE LEFT    FAMILY HISTORY: Family History  Problem Relation Age of Onset   Diabetes Mother    Hypertension Mother    Brain cancer Father        d. 20   Hypertension Sister    Diabetes Sister    Diabetes Brother    Breast cancer Paternal Aunt        dx >50   Uterine cancer Paternal Aunt        dx >50   Cancer Maternal Grandmother        unknown type, died in her 49s   Pancreatic cancer Cousin 57       maternal first cousin   Uterine cancer Cousin        dx 2s, maternal first cousin   Breast cancer Cousin 47       paternal first cousin   CAD Neg Hx    Stroke Neg Hx    Colon cancer Neg Hx    Colon polyps Neg Hx    Esophageal cancer Neg Hx    Stomach cancer Neg Hx    Rectal cancer Neg Hx    Her father died at age 40 from brain cancer. Her mother is 1 years old as of 08/2020. Debra Simon has two brothers and two  sisters. She reports breast cancer in a paternal aunt (over age 77), uterine cancer in a paternal aunt (>50) and a maternal cousin (19's), and pancreatic cancer in a maternal cousin (age 11).   GYNECOLOGIC HISTORY:  No LMP recorded. Patient has had a hysterectomy. Menarche: 70 years old Age at first live birth: 70 years old [place] P 4 LMP 02/1992, with hysterectomy Contraceptive: never used HRT never used  Hysterectomy? Yes, 02/1992 BSO? Yes?   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 Husband Bland Span works in [employer], now part time. She lives at home with her husband. Their daughter Debra Simon, age 60, is a Psychologist, sport and exercise at [employer] here in [place]. Son Debra Simon, age 1, works in transit at a train station in [place]. Son Debra Simon, age 37, is a Tour manager here in [place]. Son Debra Simon, age  89, is a Tour manager in [place]. She attends a [employer].    ADVANCED DIRECTIVES: not in place. In the absence of any documentation to the contrary, the patient's spouse is their HCPOA. She intends to name her son Debra Simon as her HCPOA.   HEALTH MAINTENANCE: Social History   Tobacco Use   Smoking status: Never   Smokeless tobacco: Never  Vaping Use   Vaping Use: Never used  Substance Use Topics   Alcohol use: No    Alcohol/week: 0.0 standard drinks    Comment: rare on holidays   Drug use: No     Colonoscopy: 01/2020 (Dr. Loletha Carrow), recall 2026  PAP: 05/2020, negative  Bone density: 03/2017, -0.1   No Known Allergies  Current Outpatient Medications  Medication Sig Dispense Refill   anastrozole (ARIMIDEX) 1 MG tablet Take 1 tablet (1 mg total) by mouth daily. Start December 26, 2020 90 tablet 4   aspirin EC 81 MG tablet Take 1 tablet (81 mg total) by mouth daily.     losartan-hydrochlorothiazide (HYZAAR) 100-25 MG tablet Take 1 tablet by mouth daily. 90 tablet 3   Multiple Vitamin  (MULTIVITAMIN) tablet Take 1 tablet by mouth daily.     simvastatin (ZOCOR) 20 MG tablet Take 1 tablet (20 mg total) by mouth at bedtime. 90 tablet 3   No current facility-administered medications for this visit.    OBJECTIVE: Spanish speaker who appears stated age  There were no vitals filed for this visit.     There is no height or weight on file to calculate BMI.   Wt Readings from Last 3 Encounters:  01/21/21 161 lb (73 kg)  01/18/21 163 lb 4 oz (74 kg)  01/04/21 161 lb 12.8 oz (73.4 kg)      ECOG FS:1 - Symptomatic but completely ambulatory  Sclerae unicteric, EOMs intact Wearing a mask No cervical or supraclavicular adenopathy Lungs no rales or rhonchi Heart regular rate and rhythm Abd soft, nontender, positive bowel sounds MSK no focal spinal tenderness, no upper extremity lymphedema Neuro: nonfocal, well oriented, appropriate affect Breasts: The right breast is unremarkable.  The left breast has undergone lumpectomy and radiation.  It is smaller than the right breast is slightly darker but otherwise cosmetically favorable.  There is no evidence of residual or recurrent disease per both axillae are benign.   LAB RESULTS:  CMP     Component Value Date/Time   NA 138 11/12/2020 0943   K 3.5 11/12/2020 0943   CL 101 11/12/2020 0943   CO2 28 11/12/2020 0943   GLUCOSE 112 (H) 11/12/2020 0943   BUN 12 11/12/2020 0943   CREATININE 0.74 11/12/2020 0943   CREATININE 0.69 09/09/2020 0812   CREATININE 0.57 06/01/2016 1018   CALCIUM 9.6 11/12/2020 0943   CALCIUM 9.0 09/11/2012 0000   PROT 7.3 11/12/2020 0943   ALBUMIN 4.1 11/12/2020 0943   AST 16 11/12/2020 0943   AST 17 09/09/2020 0812   ALT 14 11/12/2020 0943   ALT 16 09/09/2020 0812   ALKPHOS 70 11/12/2020 0943   ALKPHOS 78 09/11/2012 0000   BILITOT 0.7 11/12/2020 0943   BILITOT 0.4 09/09/2020 0812   GFRNONAA >60 11/12/2020 0943   GFRNONAA >60 09/09/2020 0812   GFRAA >60 06/06/2017 0404    No results found  for: TOTALPROTELP, ALBUMINELP, A1GS, A2GS, BETS, BETA2SER, GAMS, MSPIKE, SPEI  Lab Results  Component Value Date   WBC 5.9 11/12/2020   NEUTROABS 3.6 11/12/2020   HGB 13.4 11/12/2020  HCT 39.2 11/12/2020   MCV 87.1 11/12/2020   PLT 233 11/12/2020    No results found for: LABCA2  No components found for: XBJYNW295  No results for input(s): INR in the last 168 hours.  No results found for: LABCA2  No results found for: AOZ308  No results found for: MVH846  No results found for: NGE952  No results found for: CA2729  No components found for: HGQUANT  No results found for: CEA1 / No results found for: CEA1   No results found for: AFPTUMOR  No results found for: CHROMOGRNA  No results found for: KPAFRELGTCHN, LAMBDASER, KAPLAMBRATIO (kappa/lambda light chains)  No results found for: HGBA, HGBA2QUANT, HGBFQUANT, HGBSQUAN (Hemoglobinopathy evaluation)   No results found for: LDH  No results found for: IRON, TIBC, IRONPCTSAT (Iron and TIBC)  No results found for: FERRITIN  Urinalysis    Component Value Date/Time   COLORURINE YELLOW 09/27/2019 [place] 09/27/2019 1040   LABSPEC 1.015 09/27/2019 1040   PHURINE 8.0 09/27/2019 [place] 09/27/2019 [place] 09/27/2019 [place] 09/27/2019 1040   BILIRUBINUR Negative 03/14/2017 0944   KETONESUR NEGATIVE 09/27/2019 1040   PROTEINUR NEGATIVE 01/09/2019 1117   UROBILINOGEN 0.2 09/27/2019 1040   NITRITE NEGATIVE 09/27/2019 1040   LEUKOCYTESUR NEGATIVE 09/27/2019 1040    STUDIES: No results found.   ELIGIBLE FOR AVAILABLE RESEARCH PROTOCOL: No  ASSESSMENT: 70 y.o. [place] woman s/p Left breast lower outer quadrant biopsy 09/03/2020 for a clinical T1c N0, stage IA invasive ductal carcinoma, grade I-II, estrogen and progesterone receptor positive, HER2 not amplified, with an Mib-1 of 15%  (1) genetics testing through the [facility] BRCAplus panel  (report date 09/15/2020) or the CancerNext-Expanded + RNAinsight panel (report date 09/19/2020) found no deleterious mutations in ATM, BRCA1, BRCA2, CDH1, CHEK2, PALB2, PTEN, and TP53. The CancerNext-Expanded + RNAinsight gene panel offered by [employer] and includes sequencing and rearrangement analysis for the following 77 genes: AIP, ALK, APC, ATM, AXIN2, BAP1, BARD1, BLM, BMPR1A, BRCA1, BRCA2, BRIP1, CDC73, CDH1, CDK4, CDKN1B, CDKN2A, CHEK2, CTNNA1, DICER1, FANCC, FH, FLCN, GALNT12, KIF1B, LZTR1, MAX, MEN1, MET, MLH1, MSH2, MSH3, MSH6, MUTYH, NBN, NF1, NF2, NTHL1, PALB2, PHOX2B, PMS2, POT1, PRKAR1A, PTCH1, PTEN, RAD51C, RAD51D, RB1, RECQL, RET, SDHA, SDHAF2, SDHB, SDHC, SDHD, SMAD4, SMARCA4, SMARCB1, SMARCE1, STK11, SUFU, TMEM127, TP53, TSC1, TSC2, VHL and XRCC2 (sequencing and deletion/duplication); EGFR, EGLN1, HOXB13, KIT, MITF, PDGFRA, POLD1 and POLE (sequencing only); EPCAM and GREM1 (deletion/duplication only). RNA data is routinely analyzed for use in variant interpretation for all genes.  (2) status post left lumpectomy and sentinel lymph node sampling 10/15/2020 for a pT1c pN0, stage IA invasive ductal carcinoma, grade 2, with negative margins (A) a total of 5 left axillary lymph nodes were removed  (3) Oncotype score of 15 predicts a risk of recurrence outside the breast in the next 9 years of 4% if the patient's only systemic therapy is antiestrogens for 5 years.  It also predicts no benefit from chemotherapy  (4) adjuvant radiation 11/18/2020 through 12/17/2020 Site Technique Total Dose (Gy) Dose per Fx (Gy) Completed Fx Beam Energies  Breast, Left: Breast_Lt 3D 40.05/40.05 2.67 15/15 6XFFF  Breast, Left: Breast_Lt_Bst 3D 12/12 2 6/6 6X    (5) started anastrozole 12/26/2020 (A) [facility] at [facility] 04/18/2017 was normal   PLAN: Laniqua is now about half year out from definitive surgery for her breast cancer.  There is no evidence of residual or recurrent  disease.  She is tolerating anastrozole well and the plan will be to continue that a total of 5 years.  I have set her up for mammography and DEXA scan in February and she will see Dr. Dellis Filbert in March to discuss those results.  She will then return to see [place] in October for routine follow-up.  She will need Spanish interpreter for every visit.  I commended her exercise program.  She knows to call us for any other issue that may develop before her return here.  Total encounter time 25 minutes.Sarajane Jews C. Magrinat, MD 02/15/2021 9:56 PM Medical Oncology and Hematology [facility] [place], [place] [address] Tel. [phone]    Fax. [phone]   This document serves as a record of services personally performed by Lurline Del, MD. It was created on his behalf by Wilburn Mylar, a trained medical scribe. The creation of this record is based on the scribe's personal observations and the provider's statements to them.   I, Lurline Del MD, have reviewed the above documentation for accuracy and completeness, and I agree with the above.   *Total Encounter Time as defined by the Centers for Medicare and Medicaid Services includes, in addition to the face-to-face time of a patient visit (documented in the note above) non-face-to-face time: obtaining and reviewing outside history, ordering and reviewing medications, tests or procedures, care coordination (communications with other health care professionals or caregivers) and documentation in the medical record.

## 2021-02-16 ENCOUNTER — Other Ambulatory Visit: Payer: Self-pay

## 2021-02-16 ENCOUNTER — Inpatient Hospital Stay: Payer: Medicare HMO | Admitting: Oncology

## 2021-02-16 ENCOUNTER — Inpatient Hospital Stay: Payer: Medicare HMO | Attending: Oncology

## 2021-02-16 VITALS — BP 163/72 | HR 73 | Temp 98.6°F | Resp 16 | Ht 61.0 in | Wt 162.0 lb

## 2021-02-16 DIAGNOSIS — H540X33 Blindness right eye category 3, blindness left eye category 3: Secondary | ICD-10-CM

## 2021-02-16 DIAGNOSIS — Z79811 Long term (current) use of aromatase inhibitors: Secondary | ICD-10-CM | POA: Diagnosis not present

## 2021-02-16 DIAGNOSIS — C50512 Malignant neoplasm of lower-outer quadrant of left female breast: Secondary | ICD-10-CM | POA: Diagnosis not present

## 2021-02-16 DIAGNOSIS — Z170 Estrogen receptor positive status [ER+]: Secondary | ICD-10-CM

## 2021-02-16 DIAGNOSIS — Z923 Personal history of irradiation: Secondary | ICD-10-CM | POA: Insufficient documentation

## 2021-02-16 LAB — CBC WITH DIFFERENTIAL/PLATELET
Abs Immature Granulocytes: 0.03 10*3/uL (ref 0.00–0.07)
Basophils Absolute: 0 10*3/uL (ref 0.0–0.1)
Basophils Relative: 0 %
Eosinophils Absolute: 0.1 10*3/uL (ref 0.0–0.5)
Eosinophils Relative: 1 %
HCT: 36.9 % (ref 36.0–46.0)
Hemoglobin: 12.5 g/dL (ref 12.0–15.0)
Immature Granulocytes: 0 %
Lymphocytes Relative: 17 %
Lymphs Abs: 1.2 10*3/uL (ref 0.7–4.0)
MCH: 30.2 pg (ref 26.0–34.0)
MCHC: 33.9 g/dL (ref 30.0–36.0)
MCV: 89.1 fL (ref 80.0–100.0)
Monocytes Absolute: 0.5 10*3/uL (ref 0.1–1.0)
Monocytes Relative: 7 %
Neutro Abs: 5.6 10*3/uL (ref 1.7–7.7)
Neutrophils Relative %: 75 %
Platelets: 218 10*3/uL (ref 150–400)
RBC: 4.14 MIL/uL (ref 3.87–5.11)
RDW: 12.6 % (ref 11.5–15.5)
WBC: 7.4 10*3/uL (ref 4.0–10.5)
nRBC: 0 % (ref 0.0–0.2)

## 2021-02-16 LAB — COMPREHENSIVE METABOLIC PANEL
ALT: 14 U/L (ref 0–44)
AST: 19 U/L (ref 15–41)
Albumin: 4.3 g/dL (ref 3.5–5.0)
Alkaline Phosphatase: 76 U/L (ref 38–126)
Anion gap: 10 (ref 5–15)
BUN: 15 mg/dL (ref 8–23)
CO2: 26 mmol/L (ref 22–32)
Calcium: 9.3 mg/dL (ref 8.9–10.3)
Chloride: 102 mmol/L (ref 98–111)
Creatinine, Ser: 0.66 mg/dL (ref 0.44–1.00)
GFR, Estimated: >60 mL/min (ref >60)
Glucose, Bld: 104 mg/dL — ABNORMAL HIGH (ref 70–99)
Potassium: 3.1 mmol/L — ABNORMAL LOW (ref 3.5–5.1)
Sodium: 138 mmol/L (ref 135–145)
Total Bilirubin: 0.4 mg/dL (ref 0.3–1.2)
Total Protein: 7.5 g/dL (ref 6.5–8.1)

## 2021-03-26 ENCOUNTER — Other Ambulatory Visit: Payer: Self-pay | Admitting: Family Medicine

## 2021-03-26 DIAGNOSIS — E785 Hyperlipidemia, unspecified: Secondary | ICD-10-CM

## 2021-04-19 ENCOUNTER — Ambulatory Visit: Payer: Medicare HMO | Attending: General Surgery

## 2021-04-19 ENCOUNTER — Other Ambulatory Visit: Payer: Self-pay

## 2021-04-19 VITALS — Wt 160.4 lb

## 2021-04-19 DIAGNOSIS — Z483 Aftercare following surgery for neoplasm: Secondary | ICD-10-CM | POA: Insufficient documentation

## 2021-04-19 NOTE — Therapy (Signed)
[facility] @ [place], [place], [address] Phone: [phone]   Fax:  [phone]  Physical Therapy Treatment  Patient Details  Name: Debra Simon MRN: [id number] Date of Birth: 1950/12/17 Referring Provider (PT): Dr. Stark Klein   Encounter Date: 04/19/2021   PT End of Session - 04/19/21 0943     Visit Number 1   # unchanaged due to screen only   PT Start Time 0939    PT Stop Time 0943    PT Time Calculation (min) 4 min    Activity Tolerance Patient tolerated treatment well    Behavior During Therapy [facility] for tasks assessed/performed             Past Medical History:  Diagnosis Date   Anxiety    Arthritis    Breast cancer ([place])    Colon polyps 7048   Diastolic dysfunction, left ventricle 06/26/2016   (ECHO 2018): Left ventricle: The cavity size was normal. Systolic function was normal. The estimated ejection fraction was in the range of 60% to 65%. Wall motion was normal; there were no regional wall motion abnormalities. Doppler parameters are consistent with abnormal left ventricular relaxation (grade 1 diastolic dysfunction).   Diverticulosis    Family history of brain cancer    Family history of breast cancer    Family history of pancreatic cancer    Family history of uterine cancer    GERD (gastroesophageal reflux disease)    watches what she eats   Headache    gets headaches when she eats sweets   Heart murmur    dx last yr.   had echo 05/2016   History of radiation therapy    Left breast- 11/18/20-12/17/20- Dr. Gery Pray   Hyperlipidemia    Hypertension    Mild mitral regurgitation 07/04/2016   Pneumonia    Pre-diabetes    Prediabetes 11/11/2020   Primary localized osteoarthritis of right knee 05/24/2017   Ureterovaginal fistula    Uterine fibroid 1994   Vaginal dysplasia     Past Surgical History:  Procedure Laterality Date   BREAST CYST ASPIRATION Right    BREAST EXCISIONAL  BIOPSY Right    BREAST LUMPECTOMY WITH RADIOACTIVE SEED AND SENTINEL LYMPH NODE BIOPSY Left 10/15/2020   Procedure: LEFT BREAST LUMPECTOMY WITH RADIOACTIVE SEED AND SENTINEL LYMPH NODE BIOPSY;  Surgeon: Stark Klein, MD;  Location: [facility];  Service: General;  Laterality: Left;   COLONOSCOPY  2010   COLONOSCOPY  2012   COLONOSCOPY  2016   CYSTOSCOPY     ESOPHAGOGASTRODUODENOSCOPY  2010   TOTAL ABDOMINAL HYSTERECTOMY  1994   TOTAL KNEE ARTHROPLASTY Right 06/05/2017   TOTAL KNEE ARTHROPLASTY Right 06/05/2017   Procedure: RIGHT TOTAL KNEE ARTHROPLASTY;  Surgeon: Elsie Saas, MD;  Location: [facility];  Service: Orthopedics;  Laterality: Right;   Ueterovaginal fistual repair     URETERAL EXPLORATION  04/16/1992   URETEROTOMY ON THE RIGHT, REPAIR LACERATION ON THE LEFT    Vitals:   04/19/21 0940  Weight: 160 lb 6 oz (72.7 kg)     Subjective Assessment - 04/19/21 0940     Subjective Pt returns for her 3 month L-Dex screen.    Pertinent History Patient was diagnosed on 08/28/2020 with left grade I-II invasive ductal carcinoma breast cancer. She had a left lumpectomy and sentinel node biopsy on 10/15/2020 with 5 negative axillary nodes removed. It is ER/PR positive and HER2 negative with a Ki67 of 15%.  L-DEX FLOWSHEETS - 04/19/21 0900       L-DEX LYMPHEDEMA SCREENING   Measurement Type Unilateral    L-DEX MEASUREMENT EXTREMITY Upper Extremity    POSITION  Standing    DOMINANT SIDE Right    At Risk Side Left    BASELINE SCORE (UNILATERAL) 2.3    L-DEX SCORE (UNILATERAL) 3.6    VALUE CHANGE (UNILAT) 1.3                                            Plan - 04/19/21 0943     Clinical Impression Statement Pt returns for her 3 month L-Dex screen.  Her change from baseline of 1.3 is WNLs so no further treatment is required at this time except to cont every 3 month L-Dex screen which pt is agreeable to.    PT Next Visit Plan Cont  every 3 month L-Dex screens for up to 2 years from her SLNB (~10/16/2022)    Consulted and Agree with Plan of Care Patient             Patient will benefit from skilled therapeutic intervention in order to improve the following deficits and impairments:     Visit Diagnosis: Aftercare following surgery for neoplasm     Problem List Patient Active Problem List   Diagnosis Date Noted   Prediabetes 11/11/2020   Genetic testing 09/16/2020   Family history of breast cancer 09/10/2020   Family history of brain cancer 09/10/2020   Family history of uterine cancer 09/10/2020   Family history of pancreatic cancer 09/10/2020   Blindness right eye category 3, blindness left eye category 3 09/09/2020   Malignant neoplasm of lower-outer quadrant of left breast of female, estrogen receptor positive ([place]) 09/07/2020   Varicose veins of both lower extremities with pain 83/72/9021   Umbilical hernia without obstruction and without gangrene 07/08/2020   History of lacunar cerebrovascular accident 09/27/2019   Serrated adenoma of colon 09/27/2019   Bilateral primary osteoarthritis of knee 06/05/2017   Diverticulosis    History of vaginal dysplasia 08/31/2016   Mixed hyperlipidemia 06/02/2016   Sensorineural hearing loss (SNHL), bilateral 07/07/2015   Rectocele 05/30/2014   Essential hypertension 07/31/2013    Debra Simon, PTA 04/19/2021, 9:45 AM  [facility] @ [place], [place], [address] Phone: [phone]   Fax:  [phone]  Name: Debra Simon MRN: [id number] Date of Birth: 13-Dec-1950

## 2021-06-24 ENCOUNTER — Ambulatory Visit (INDEPENDENT_AMBULATORY_CARE_PROVIDER_SITE_OTHER): Payer: Medicare HMO | Admitting: Obstetrics & Gynecology

## 2021-06-24 ENCOUNTER — Encounter: Payer: Self-pay | Admitting: Obstetrics & Gynecology

## 2021-06-24 VITALS — BP 124/80 | HR 70 | Resp 16 | Ht 60.25 in | Wt 158.0 lb

## 2021-06-24 DIAGNOSIS — Z853 Personal history of malignant neoplasm of breast: Secondary | ICD-10-CM

## 2021-06-24 DIAGNOSIS — Z780 Asymptomatic menopausal state: Secondary | ICD-10-CM

## 2021-06-24 DIAGNOSIS — Z9289 Personal history of other medical treatment: Secondary | ICD-10-CM

## 2021-06-24 DIAGNOSIS — Z9189 Other specified personal risk factors, not elsewhere classified: Secondary | ICD-10-CM | POA: Diagnosis not present

## 2021-06-24 DIAGNOSIS — Z90710 Acquired absence of both cervix and uterus: Secondary | ICD-10-CM

## 2021-06-24 DIAGNOSIS — Z01419 Encounter for gynecological examination (general) (routine) without abnormal findings: Secondary | ICD-10-CM

## 2021-06-24 NOTE — Progress Notes (Signed)
? ? ?Debra Simon 07-Jan-1951 [id number] ? ? ?History:    71 y.o. [id number] Married ?  ?RP:  Established patient presenting for annual gyn exam  ?  ?HPI:  S/P Total Abdominal Hysterectomy.  Postmenopausal, well on no hormone replacement therapy.  No pelvic pain.  Abstinent.  Pap 05/2020 Neg.  Urine and bowel movements normal. Left Breast Ca Dx in 07/2020.  On Anastrozole.  Body mass index 30.6.  Walking and working in the yard. Health labs with family physician. Colono 01/2020.  BD 03/2017 Normal, scheduled to repeat at the [facility]. COLONOSCOPY: 11-19-2. ? ?Past medical history,surgical history, family history and social history were all reviewed and documented in the EPIC chart. ? ?Gynecologic History ?No LMP recorded. Patient has had a hysterectomy. ? ?Obstetric History ?OB History  ?Gravida Para Term Preterm AB Living  ?'7 4     3 4  '$ ?SAB IAB Ectopic Multiple Live Births  ?3          ?  ?# Outcome Date GA Lbr Len/2nd Weight Sex Delivery Anes PTL Lv  ?7 SAB           ?6 SAB           ?5 SAB           ?4 Para           ?3 Para           ?2 Para           ?1 Para           ? ? ? ?ROS: A ROS was performed and pertinent positives and negatives are included in the history. ? GENERAL: No fevers or chills. HEENT: No change in vision, no earache, sore throat or sinus congestion. NECK: No pain or stiffness. CARDIOVASCULAR: No chest pain or pressure. No palpitations. PULMONARY: No shortness of breath, cough or wheeze. GASTROINTESTINAL: No abdominal pain, nausea, vomiting or diarrhea, melena or bright red blood per rectum. GENITOURINARY: No urinary frequency, urgency, hesitancy or dysuria. MUSCULOSKELETAL: No joint or muscle pain, no back pain, no recent trauma. DERMATOLOGIC: No rash, no itching, no lesions. ENDOCRINE: No polyuria, polydipsia, no heat or cold intolerance. No recent change in weight. HEMATOLOGICAL: No anemia or easy bruising or bleeding. NEUROLOGIC: No headache, seizures, numbness, tingling or weakness.  PSYCHIATRIC: No depression, no loss of interest in normal activity or change in sleep pattern.  ?  ? ?Exam: ? ? ?BP 124/80   Pulse 70   Resp 16   Ht 5' 0.25" (1.53 m)   Wt 158 lb (71.7 kg)   BMI 30.60 kg/m?  ? ?Body mass index is 30.6 kg/m?. ? ?General appearance : Well developed well nourished female. No acute distress ?HEENT: Eyes: no retinal hemorrhage or exudates,  Neck supple, trachea midline, no carotid bruits, no thyroidmegaly ?Lungs: Clear to auscultation, no rhonchi or wheezes, or rib retractions  ?Heart: Regular rate and rhythm, no murmurs or gallops ?Breast:Examined in sitting and supine position were symmetrical in appearance, no palpable masses or tenderness,  no skin retraction, no nipple inversion, no nipple discharge, no skin discoloration, no axillary or supraclavicular lymphadenopathy ?Abdomen: no palpable masses or tenderness, no rebound or guarding ?Extremities: no edema or skin discoloration or tenderness ? ?Pelvic: Vulva: Normal ?            Vagina: No gross lesions or discharge ? Cervix/Uterus absent ? Adnexa  Without masses or tenderness ? Anus: Normal ? ? ?Assessment/Plan:  71 y.o.  female for annual exam  ? ?1. Well female exam with routine gynecological exam ?S/P Total Abdominal Hysterectomy.  Postmenopausal, well on no hormone replacement therapy.  No pelvic pain.  Abstinent.  Pap 05/2020 Neg.  Urine and bowel movements normal. Left Breast Ca Dx in 07/2020.  On Anastrozole.  Body mass index 30.6.  Walking and working in the yard. Health labs with family physician. Colono 01/2020.  BD 03/2017 Normal, scheduled to repeat at the [facility]. COLONOSCOPY: 11-19-2. ? ?2. At increased risk of breast cancer ? ?3. S/P total hysterectomy ? ?4. Postmenopausal ?S/P Total Abdominal Hysterectomy.  Postmenopausal, well on no hormone replacement therapy.  No pelvic pain.  Abstinent.  ? ?5. Personal history of Left breast cancer diagnosed in 07/2020 ?On Anastrozole. ? ?6. Personal history of other  medical treatment  ? ?Princess Bruins MD, 11:33 AM 06/24/2021 ? ?  ?

## 2021-06-25 ENCOUNTER — Encounter: Payer: Self-pay | Admitting: Obstetrics & Gynecology

## 2021-07-06 ENCOUNTER — Encounter (HOSPITAL_COMMUNITY): Payer: Self-pay

## 2021-07-12 ENCOUNTER — Encounter: Payer: Medicare HMO | Admitting: Family Medicine

## 2021-07-19 ENCOUNTER — Ambulatory Visit: Payer: Medicare HMO | Attending: General Surgery

## 2021-07-19 VITALS — Wt 160.0 lb

## 2021-07-19 DIAGNOSIS — Z483 Aftercare following surgery for neoplasm: Secondary | ICD-10-CM | POA: Insufficient documentation

## 2021-07-19 NOTE — Therapy (Signed)
?OUTPATIENT PHYSICAL THERAPY SOZO SCREENING NOTE ? ? ?Patient Name: Debra Simon ?MRN: [id number] ?DOB:02/20/1951, 71 y.o., female ?Today's Date: 07/19/2021 ? ?PCP: Leamon Arnt, MD ?REFERRING PROVIDER: Stark Klein, MD ? ? PT End of Session - 07/19/21 1002   ? ? Visit Number 1   # unchanged due to screen only  ? PT Start Time [phone]   ? PT Stop Time 1005   ? PT Time Calculation (min) 6 min   ? Activity Tolerance Patient tolerated treatment well   ? Behavior During Therapy [facility] for tasks assessed/performed   ? ?  ?  ? ?  ? ? ?Past Medical History:  ?Diagnosis Date  ? Anxiety   ? Arthritis   ? Breast cancer ([place])   ? left breast 2022  ? Colon polyps 2010  ? Diastolic dysfunction, left ventricle 06/26/2016  ? (ECHO 2018): Left ventricle: The cavity size was normal. Systolic function was normal. The estimated ejection fraction was in the range of 60% to 65%. Wall motion was normal; there were no regional wall motion abnormalities. Doppler parameters are consistent with abnormal left ventricular relaxation (grade 1 diastolic dysfunction).  ? Diverticulosis   ? Family history of brain cancer   ? Family history of breast cancer   ? Family history of pancreatic cancer   ? Family history of uterine cancer   ? GERD (gastroesophageal reflux disease)   ? watches what she eats  ? Headache   ? gets headaches when she eats sweets  ? Heart murmur   ? dx last yr.   had echo 05/2016  ? History of radiation therapy   ? Left breast- 11/18/20-12/17/20- Dr. Gery Pray  ? Hyperlipidemia   ? Hypertension   ? Mild mitral regurgitation 07/04/2016  ? Pneumonia   ? Pre-diabetes   ? Prediabetes 11/11/2020  ? Primary localized osteoarthritis of right knee 05/24/2017  ? Ureterovaginal fistula   ? Uterine fibroid 1994  ? Vaginal dysplasia   ? ?Past Surgical History:  ?Procedure Laterality Date  ? BREAST CYST ASPIRATION Right   ? BREAST EXCISIONAL BIOPSY Right   ? BREAST LUMPECTOMY WITH RADIOACTIVE SEED AND SENTINEL LYMPH NODE BIOPSY Left  10/15/2020  ? Procedure: LEFT BREAST LUMPECTOMY WITH RADIOACTIVE SEED AND SENTINEL LYMPH NODE BIOPSY;  Surgeon: Stark Klein, MD;  Location: [place];  Service: General;  Laterality: Left;  ? COLONOSCOPY  2010  ? COLONOSCOPY  2012  ? COLONOSCOPY  2016  ? CYSTOSCOPY    ? ESOPHAGOGASTRODUODENOSCOPY  2010  ? TOTAL ABDOMINAL HYSTERECTOMY  1994  ? TOTAL KNEE ARTHROPLASTY Right 06/05/2017  ? TOTAL KNEE ARTHROPLASTY Right 06/05/2017  ? Procedure: RIGHT TOTAL KNEE ARTHROPLASTY;  Surgeon: Elsie Saas, MD;  Location: [place];  Service: Orthopedics;  Laterality: Right;  ? Ueterovaginal fistual repair    ? URETERAL EXPLORATION  04/16/1992  ? URETEROTOMY ON THE RIGHT, REPAIR LACERATION ON THE LEFT  ? ?Patient Active Problem List  ? Diagnosis Date Noted  ? Prediabetes 11/11/2020  ? Genetic testing 09/16/2020  ? Family history of breast cancer 09/10/2020  ? Family history of brain cancer 09/10/2020  ? Family history of uterine cancer 09/10/2020  ? Family history of pancreatic cancer 09/10/2020  ? Blindness right eye category 3, blindness left eye category 3 09/09/2020  ? Malignant neoplasm of lower-outer quadrant of left breast of female, estrogen receptor positive ([place]) 09/07/2020  ? Varicose veins of both lower extremities with pain 07/08/2020  ? Umbilical hernia without obstruction and without gangrene 07/08/2020  ?  History of lacunar cerebrovascular accident 09/27/2019  ? Serrated adenoma of colon 09/27/2019  ? Bilateral primary osteoarthritis of knee 06/05/2017  ? Diverticulosis   ? History of vaginal dysplasia 08/31/2016  ? Mixed hyperlipidemia 06/02/2016  ? Sensorineural hearing loss (SNHL), bilateral 07/07/2015  ? Rectocele 05/30/2014  ? Essential hypertension 07/31/2013  ? ? ?REFERRING DIAG: left breast cancer at risk for lymphedema ? ?THERAPY DIAG:  ?Aftercare following surgery for neoplasm ? ?PERTINENT HISTORY: Patient was diagnosed on 08/28/2020 with left grade I-II invasive ductal carcinoma breast cancer. She had a left  lumpectomy and sentinel node biopsy on 10/15/2020 with 5 negative axillary nodes removed. It is ER/PR positive and HER2 negative with a Ki67 of 15%.  ? ?PRECAUTIONS: left UE Lymphedema risk, None ? ?SUBJECTIVE: Pt returns for her 3 month L-Dex screen.  ? ?PAIN:  ?Are you having pain? No ? ?SOZO SCREENING: ?Patient was assessed today using the SOZO machine to determine the lymphedema index score. This was compared to her baseline score. It was determined that she is within the recommended range when compared to her baseline and no further action is needed at this time. She will continue SOZO screenings. These are done every 3 months for 2 years post operatively followed by every 6 months for 2 years, and then annually. ? ? ? ?[name], PTA ?07/19/2021, 10:08 AM ? ?  ? ?

## 2021-08-06 ENCOUNTER — Encounter: Payer: Self-pay | Admitting: Family Medicine

## 2021-08-06 ENCOUNTER — Ambulatory Visit (INDEPENDENT_AMBULATORY_CARE_PROVIDER_SITE_OTHER): Payer: Medicare HMO | Admitting: Family Medicine

## 2021-08-06 VITALS — BP 120/64 | HR 69 | Temp 98.2°F | Ht 61.0 in | Wt 158.5 lb

## 2021-08-06 DIAGNOSIS — Z170 Estrogen receptor positive status [ER+]: Secondary | ICD-10-CM

## 2021-08-06 DIAGNOSIS — R011 Cardiac murmur, unspecified: Secondary | ICD-10-CM | POA: Diagnosis not present

## 2021-08-06 DIAGNOSIS — E782 Mixed hyperlipidemia: Secondary | ICD-10-CM

## 2021-08-06 DIAGNOSIS — C50512 Malignant neoplasm of lower-outer quadrant of left female breast: Secondary | ICD-10-CM | POA: Diagnosis not present

## 2021-08-06 DIAGNOSIS — I340 Nonrheumatic mitral (valve) insufficiency: Secondary | ICD-10-CM | POA: Diagnosis not present

## 2021-08-06 DIAGNOSIS — Z8673 Personal history of transient ischemic attack (TIA), and cerebral infarction without residual deficits: Secondary | ICD-10-CM

## 2021-08-06 DIAGNOSIS — R7303 Prediabetes: Secondary | ICD-10-CM

## 2021-08-06 DIAGNOSIS — Z0000 Encounter for general adult medical examination without abnormal findings: Secondary | ICD-10-CM | POA: Diagnosis not present

## 2021-08-06 DIAGNOSIS — I10 Essential (primary) hypertension: Secondary | ICD-10-CM

## 2021-08-06 LAB — LIPID PANEL
Cholesterol: 126 mg/dL (ref 0–200)
HDL: 47.9 mg/dL (ref >39.00)
LDL Cholesterol: 61 mg/dL (ref 0–99)
NonHDL: 77.89
Total CHOL/HDL Ratio: 3
Triglycerides: 85 mg/dL (ref 0.0–149.0)
VLDL: 17 mg/dL (ref 0.0–40.0)

## 2021-08-06 LAB — CBC WITH DIFFERENTIAL/PLATELET
Basophils Absolute: 0 10*3/uL (ref 0.0–0.1)
Basophils Relative: 0.1 % (ref 0.0–3.0)
Eosinophils Absolute: 0.2 10*3/uL (ref 0.0–0.7)
Eosinophils Relative: 3.6 % (ref 0.0–5.0)
HCT: 38.9 % (ref 36.0–46.0)
Hemoglobin: 13.1 g/dL (ref 12.0–15.0)
Lymphocytes Relative: 23.5 % (ref 12.0–46.0)
Lymphs Abs: 1.2 10*3/uL (ref 0.7–4.0)
MCHC: 33.6 g/dL (ref 30.0–36.0)
MCV: 89.3 fl (ref 78.0–100.0)
Monocytes Absolute: 0.3 10*3/uL (ref 0.1–1.0)
Monocytes Relative: 6.1 % (ref 3.0–12.0)
Neutro Abs: 3.5 10*3/uL (ref 1.4–7.7)
Neutrophils Relative %: 66.7 % (ref 43.0–77.0)
Platelets: 206 10*3/uL (ref 150.0–400.0)
RBC: 4.35 Mil/uL (ref 3.87–5.11)
RDW: 13.2 % (ref 11.5–15.5)
WBC: 5.2 10*3/uL (ref 4.0–10.5)

## 2021-08-06 LAB — COMPREHENSIVE METABOLIC PANEL
ALT: 13 U/L (ref 0–35)
AST: 18 U/L (ref 0–37)
Albumin: 4.6 g/dL (ref 3.5–5.2)
Alkaline Phosphatase: 64 U/L (ref 39–117)
BUN: 12 mg/dL (ref 6–23)
CO2: 29 mEq/L (ref 19–32)
Calcium: 9.9 mg/dL (ref 8.4–10.5)
Chloride: 100 mEq/L (ref 96–112)
Creatinine, Ser: 0.61 mg/dL (ref 0.40–1.20)
GFR: 91.02 mL/min (ref >60.00)
Glucose, Bld: 97 mg/dL (ref 70–99)
Potassium: 3.2 mEq/L — ABNORMAL LOW (ref 3.5–5.1)
Sodium: 138 mEq/L (ref 135–145)
Total Bilirubin: 0.7 mg/dL (ref 0.2–1.2)
Total Protein: 7.4 g/dL (ref 6.0–8.3)

## 2021-08-06 LAB — TSH: TSH: 2.81 u[IU]/mL (ref 0.35–5.50)

## 2021-08-06 LAB — HEMOGLOBIN A1C: Hgb A1c MFr Bld: 5.9 % (ref 4.6–6.5)

## 2021-08-06 NOTE — Progress Notes (Addendum)
Subjective  Chief Complaint  Patient presents with   Annual Exam    Fasting     HPI: Debra Simon is a 71 y.o. female who presents to [facility] [place] today for a Female Wellness Visit. She also has the concerns and/or needs as listed above in the chief complaint. These will be addressed in addition to the Health Maintenance Visit.  The use of a interpreter was used for this visit.  Patient speaks Spanish. Wellness Visit: annual visit with health maintenance review and exam without Pap  Health maintenance: Mammogram and bone density are due and both are scheduled.  Colonoscopy screens are current.  History of serrated adenoma.  Every 5 year surveillance.  Immunizations are current.  She continues to do very well.  Active and happy.  Had recent GYN female wellness visit, I reviewed notes Chronic disease f/u and/or acute problem visit: (deemed necessary to be done in addition to the wellness visit): Hypertension on Hyzaar and doing well.  Blood pressure is normal.  Denies chest pain or shortness of breath.  No lower extremity edema. Hyperlipidemia on statin.  Well-tolerated.  Fasting for recheck.  Goal LDL less than 70 given history of remote lacunar infarct. Remote small lacunar infarct: No residual effects.  Identified during work-up for acute vertigo.  Reviewed MRI.  Continue aspirin therapy. History of prediabetes without symptoms of hyperglycemia.  She is eating better.  Weight is down a little bit. Breast cancer: On Arimidex.  Doing well.  Does have some residual left chest wall pain from radiation therapy.  Assessment  1. Annual physical exam   2. Essential hypertension   3. Mixed hyperlipidemia   4. Malignant neoplasm of lower-outer quadrant of left breast of female, estrogen receptor positive (HCC)   5. Prediabetes   6. History of lacunar cerebrovascular accident (CVA)      Plan  Female Wellness Visit: Age appropriate Health Maintenance and Prevention  measures were discussed with patient. Included topics are cancer screening recommendations, ways to keep healthy (see AVS) including dietary and exercise recommendations, regular eye and dental care, use of seat belts, and avoidance of moderate alcohol use and tobacco use.  Mammogram and bone density scheduled BMI: discussed patient's BMI and encouraged positive lifestyle modifications to help get to or maintain a target BMI. HM needs and immunizations were addressed and ordered. See below for orders. See HM and immunization section for updates. Routine labs and screening tests ordered including cmp, cbc and lipids where appropriate. Discussed recommendations regarding Vit D and calcium supplementation (see AVS)  Chronic disease management visit and/or acute problem visit: Hypertension is well controlled on Hyzaar 100/25 daily.  Recheck renal function electrolytes Hyperlipidemia on simvastatin 20 mg nightly.  Goal LDL less than 70.  We will recheck fasting levels today.  May need to change to higher intensity statin. History of lacunar infarct continue aspirin Recheck prediabetes. Breast cancer: Stable on treatment.  Follow along with oncology.  Follow up: 6 months for hypertension recheck. Orders Placed This Encounter  Procedures   CBC with Differential/Platelet   Comprehensive metabolic panel   Lipid panel   Hemoglobin A1c   TSH   No orders of the defined types were placed in this encounter.     Body mass index is 29.95 kg/m. Wt Readings from Last 3 Encounters:  08/06/21 158 lb 8 oz (71.9 kg)  07/19/21 160 lb (72.6 kg)  06/24/21 158 lb (71.7 kg)     Patient Active Problem  List   Diagnosis Date Noted   Prediabetes 11/11/2020    Priority: High   Malignant neoplasm of lower-outer quadrant of left breast of female, estrogen receptor positive (HCC) 09/07/2020    Priority: High    Mammogram due June 2023 at [facility]      Serrated adenoma of colon 09/27/2019    Priority: High     Colonoscopy 2016, performed [place], recommend 5-year follow-up Colonoscopy August 2021, serrated adenoma     Mixed hyperlipidemia 06/02/2016    Priority: High   Essential hypertension 07/31/2013    Priority: High   Blindness right eye category 3, blindness left eye category 3 09/09/2020    Priority: Medium    Umbilical hernia without obstruction and without gangrene 07/08/2020    Priority: Medium    History of lacunar cerebrovascular accident (CVA) 09/27/2019    Priority: Medium     MRI 06/23/15 when working up dizziness, on aspirin since    Bilateral primary osteoarthritis of knee 06/05/2017    Priority: Medium    History of vaginal dysplasia 08/31/2016    Priority: Medium    Varicose veins of both lower extremities with pain 07/08/2020    Priority: Low   Diverticulosis     Priority: Low   Sensorineural hearing loss (SNHL), bilateral 07/07/2015    Priority: Low   Rectocele 05/30/2014    Priority: Low   Genetic testing 09/16/2020    Negative genetic testing:  No pathogenic variants detected on the [employer] BRCAplus panel (report date 09/15/2020) or the CancerNext-Expanded + RNAinsight panel (report date 09/19/2020).    The BRCAplus panel offered by [employer] and includes sequencing and deletion/duplication analysis for the following 8 genes: ATM, BRCA1, BRCA2, CDH1, CHEK2, PALB2, PTEN, and TP53. The CancerNext-Expanded + RNAinsight gene panel offered by [employer] and includes sequencing and rearrangement analysis for the following 77 genes: AIP, ALK, APC, ATM, AXIN2, BAP1, BARD1, BLM, BMPR1A, BRCA1, BRCA2, BRIP1, CDC73, CDH1, CDK4, CDKN1B, CDKN2A, CHEK2, CTNNA1, DICER1, FANCC, FH, FLCN, GALNT12, KIF1B, LZTR1, MAX, MEN1, MET, MLH1, MSH2, MSH3, MSH6, MUTYH, NBN, NF1, NF2, NTHL1, PALB2, PHOX2B, PMS2, POT1, PRKAR1A, PTCH1, PTEN, RAD51C, RAD51D, RB1, RECQL, RET, SDHA, SDHAF2, SDHB, SDHC, SDHD, SMAD4, SMARCA4, SMARCB1, SMARCE1, STK11, SUFU, TMEM127, TP53, TSC1, TSC2, VHL and XRCC2  (sequencing and deletion/duplication); EGFR, EGLN1, HOXB13, KIT, MITF, PDGFRA, POLD1 and POLE (sequencing only); EPCAM and GREM1 (deletion/duplication only). RNA data is routinely analyzed for use in variant interpretation for all genes.    Family history of breast cancer 09/10/2020   Family history of brain cancer 09/10/2020   Family history of uterine cancer 09/10/2020   Family history of pancreatic cancer 09/10/2020   Health Maintenance  Topic Date Due   MAMMOGRAM  08/06/2021   COVID-19 Vaccine (4 - Booster) 11/18/2021 (Originally 04/30/2020)   INFLUENZA VACCINE  10/26/2021   DEXA SCAN  04/18/2022   COLONOSCOPY (Pts 45-33yrs Insurance coverage will need to be confirmed)  02/13/2025   TETANUS/TDAP  01/23/2028   Pneumonia Vaccine 57+ Years old  Completed   Hepatitis C Screening  Completed   Zoster Vaccines- Shingrix  Completed   HPV VACCINES  Aged Out   Immunization History  Administered Date(s) Administered   Fluad Quad(high Dose 65+) 01/08/2020, 01/04/2021   Influenza, High Dose Seasonal PF 12/26/2017   Influenza,inj,Quad PF,6+ Mos 01/09/2019   Influenza-Unspecified 01/04/2021   Moderna Sars-Covid-2 Vaccination 07/26/2019, 08/23/2019   PFIZER(Purple Top)SARS-COV-2 Vaccination 03/05/2020   Pneumococcal Conjugate-13 12/26/2017   Pneumococcal Polysaccharide-23 09/27/2019   Tdap 01/22/2018  Zoster Recombinat (Shingrix) 07/22/2016, 11/18/2016   We updated and reviewed the patient's past history in detail and it is documented below. Allergies: Patient has No Known Allergies. Past Medical History Patient  has a past medical history of Anxiety, Arthritis, Breast cancer (HCC), Colon polyps (2010), Diastolic dysfunction, left ventricle (06/26/2016), Diverticulosis, Family history of brain cancer, Family history of breast cancer, Family history of pancreatic cancer, Family history of uterine cancer, GERD (gastroesophageal reflux disease), Headache, Heart murmur, History of radiation  therapy, Hyperlipidemia, Hypertension, Mild mitral regurgitation (07/04/2016), Pneumonia, Pre-diabetes, Prediabetes (11/11/2020), Primary localized osteoarthritis of right knee (05/24/2017), Ureterovaginal fistula, Uterine fibroid (1994), and Vaginal dysplasia. Past Surgical History Patient  has a past surgical history that includes Cystoscopy; Ureteral exploration (04/16/1992); Esophagogastroduodenoscopy (2010); Breast cyst aspiration (Right); Breast excisional biopsy (Right); Total abdominal hysterectomy (1994); Total knee arthroplasty (Right, 06/05/2017); Total knee arthroplasty (Right, 06/05/2017); Ueterovaginal fistual repair; Colonoscopy (2010); Colonoscopy (2012); Colonoscopy (2016); and Breast lumpectomy with radioactive seed and sentinel lymph node biopsy (Left, 10/15/2020). Family History: Patient family history includes Brain cancer in her father; Breast cancer in her paternal aunt; Breast cancer (age of onset: 29) in her cousin; Cancer in her maternal grandmother; Diabetes in her brother, mother, and sister; Hypertension in her mother and sister; Pancreatic cancer (age of onset: 82) in her cousin; Uterine cancer in her cousin and paternal aunt. Social History:  Patient  reports that she has never smoked. She has never used smokeless tobacco. She reports that she does not drink alcohol and does not use drugs.  Review of Systems: Constitutional: negative for fever or malaise Ophthalmic: negative for photophobia, double vision or loss of vision Cardiovascular: negative for chest pain, dyspnea on exertion, or new LE swelling Respiratory: negative for SOB or persistent cough Gastrointestinal: negative for abdominal pain, change in bowel habits or melena Genitourinary: negative for dysuria or gross hematuria, no abnormal uterine bleeding or disharge Musculoskeletal: negative for new gait disturbance or muscular weakness Integumentary: negative for new or persistent rashes, no breast  lumps Neurological: negative for TIA or stroke symptoms Psychiatric: negative for SI or delusions Allergic/Immunologic: negative for hives  Patient Care Team    Relationship Specialty Notifications Start End  Willow Ora, MD PCP - General Family Medicine  09/27/19   Salvatore Marvel, MD Consulting Physician Orthopedic Surgery  08/09/18   Genia Del, MD Consulting Physician Obstetrics and Gynecology  09/27/19   Almond Lint, MD Consulting Physician General Surgery  09/07/20   Antony Blackbird, MD Consulting Physician Radiation Oncology  09/07/20   Loa Socks, NP Nurse Practitioner Hematology and Oncology  12/25/20   Maryclare Labrador, RN Registered Nurse   12/25/20   Rachel Moulds, MD Consulting Physician Hematology and Oncology  04/07/21     Objective  Vitals: BP 120/64   Pulse 69   Temp 98.2 F (36.8 C) (Temporal)   Ht 5\' 1"  (1.549 m)   Wt 158 lb 8 oz (71.9 kg)   SpO2 99%   BMI 29.95 kg/m  General:  Well developed, well nourished, no acute distress  Psych:  Alert and orientedx3,normal mood and affect HEENT:  Normocephalic, atraumatic, non-icteric sclera,  supple neck without adenopathy, mass or thyromegaly Cardiovascular:  Normal S1, S2, RRR without gallop, +2/6 systolic murmur present Respiratory:  Good breath sounds bilaterally, CTAB with normal respiratory effort Gastrointestinal: normal bowel sounds, soft, non-tender, no noted masses. No HSM MSK: no deformities, contusions. Joints are without erythema or swelling.  Skin:  Warm, no rashes or suspicious lesions  noted Neurologic:    Mental status is normal. CN 2-11 are normal. Gross motor and sensory exams are normal. Normal gait. No tremor   Commons side effects, risks, benefits, and alternatives for medications and treatment plan prescribed today were discussed, and the patient expressed understanding of the given instructions. Patient is instructed to call or message via MyChart if he/she has any questions or  concerns regarding our treatment plan. No barriers to understanding were identified. We discussed Red Flag symptoms and signs in detail. Patient expressed understanding regarding what to do in case of urgent or emergency type symptoms.  Medication list was reconciled, printed and provided to the patient in AVS. Patient instructions and summary information was reviewed with the patient as documented in the AVS. This note was prepared with assistance of Dragon voice recognition software. Occasional wrong-word or sound-a-like substitutions may have occurred due to the inherent limitations of voice recognition software  This visit occurred during the SARS-CoV-2 public health emergency.  Safety protocols were in place, including screening questions prior to the visit, additional usage of staff PPE, and extensive cleaning of exam room while observing appropriate contact time as indicated for disinfecting solutions.

## 2021-08-06 NOTE — Patient Instructions (Signed)
Please return in 6 months for hypertension follow up.  ? ?I will release your lab results to you on your MyChart account with further instructions. You may see the results before I do, but when I review them I will send you a message with my report or have my assistant call you if things need to be discussed. Please reply to my message with any questions. Thank you!  ? ?If you have any questions or concerns, please don't hesitate to send me a message via MyChart or call the office at [phone]. Thank you for visiting with [place] today! It's our pleasure caring for you.  ? ?Cuidados preventivos en las mujeres a partir de los 79 a?os de edad ?Preventive Care 53 Years and Older, Female ?Los cuidados preventivos hacen referencia a las opciones en cuanto al estilo de vida y a las visitas al m?dico, las cuales pueden promover la salud y Musician. Las visitas de cuidado preventivo tambi?n se denominan ex?menes de Therapist, sports. ??Qu? puedo esperar para mi visita de cuidado preventivo? ?Asesoramiento ?Su m?dico puede preguntarle acerca de: ?Antecedentes m?dicos, incluidos los siguientes: ?Problemas m?dicos pasados. ?Antecedentes m?dicos familiares. ?Embarazo y antecedentes menstruales. ?Sus antecedentes de ca?das. ?Salud actual, incluido lo siguiente: ?Memoria y capacidad de comprensi?n (capacidad intelectual). ?Su bienestar emocional. ?Bienestar en el hogar y las relaciones personales. ?Actividad sexual y salud sexual. ?Estilo de vida, incluido lo siguiente: ?Consumo de alcohol, nicotina, tabaco o drogas. ?Acceso a armas de fuego. ?H?bitos de alimentaci?n, ejercicio y sue?o. ?Su trabajo y [place] laboral. ?Uso de pantalla solar. ?Cuestiones de seguridad, como el uso de cintur?n de seguridad y casco de Secondary school teacher. ?Examen f?sico ?El m?dico revisar? lo siguiente: ?Estatura y [place]. Estos pueden usarse para calcular el IMC (?ndice de masa corporal). [facility] es una medici?n que indica si tiene un peso saludable. ?Circunferencia  de la cintura. Es una medici?n alrededor de Science writer. Esta medici?n tambi?n indica si tiene un peso saludable y puede ayudar a predecir su riesgo de padecer ciertas enfermedades, como diabetes tipo 2 y presi?n arterial alta. ?Frecuencia card?aca y presi?n arterial. ?Temperatura corporal. ?Piel para Recruitment consultant. ??Qu? vacunas necesito? ? ?Las vacunas se aplican a varias edades, seg?n un cronograma. El m?dico le recomendar? vacunas seg?n su edad, sus antecedentes m?dicos, su estilo de vida y otros factores, como los viajes o el lugar donde trabaja. ??Qu? pruebas necesito? ?Pruebas de detecci?n ?El m?dico puede recomendar pruebas de detecci?n de ciertas afecciones. Esto puede incluir: ?Niveles de l?pidos y colesterol. ?Prueba de hepatitis C. ?Prueba de hepatitis B. ?Prueba del VIH (virus de inmunodeficiencia humana). ?Pruebas de infecciones de transmisi?n sexual (ITS), si est? en riesgo. ?Pruebas de detecci?n de c?ncer de pulm?n. ?Pruebas de detecci?n de c?ncer colorrectal. ?Pruebas de detecci?n de la diabetes. Esto se realiza mediante un control del az?car en la sangre (glucosa) despu?s de no haber comido durante un periodo de tiempo (ayuno). ?Mamograf?a. Hable con el m?dico sobre la frecuencia con la que debe realizarse mamograf?as. ?Pruebas de detecci?n de c?ncer relacionado con las mutaciones del BRCA. Es posible que se las deba realizar si tiene antecedentes de c?ncer de mama, de ovario, de trompas o peritoneal. ?Densitometr?a ?sea. Esto se realiza para detectar osteoporosis. ?Hable con su m?dico [employer], las opciones de tratamiento y, si corresponde, la necesidad de Optometrist m?s pruebas. ?Siga estas instrucciones en su casa: ?Comida y bebida ? ?Siga una dieta que incluya frutas y verduras frescas, cereales integrales, prote?nas magras y productos l?cteos descremados.  Limite el consumo de alimentos con alto contenido de az?car, grasas saturadas y sal. ?Tome los  suplementos vitam?nicos y minerales como se lo haya indicado el m?dico. ?No beba alcohol si el m?dico se lo proh?be. ?Si bebe alcohol: ?Limite la cantidad que consume de 0 a 1 medida por d?a. ?Sepa cu?nta cantidad de alcohol hay en las bebidas que toma. En los [place], una medida equivale a una botella de cerveza de 12 oz (355 ml), un vaso de vino de 5 oz (148 ml) o un vaso de una bebida alcoh?lica de alta graduaci?n de 1? oz (44 ml). ?[place] ?Cep?llese los dientes a la ma?ana y a la noche con pasta dental con fluoruro. Use hilo dental una vez al d?a. ?Haga al menos 30 minutos de ejercicio, 5 o m?s d?as cada semana. ?No consuma ning?n producto que contenga nicotina o tabaco. Estos productos incluyen cigarrillos, tabaco para mascar y aparatos de vapeo, como los cigarrillos electr?nicos. Si necesita ayuda para dejar de fumar, consulte al m?dico. ?No consuma drogas. ?Si es sexualmente activa, practique sexo seguro. Use un cond?n u otra forma de protecci?n a fin de prevenir las infecciones de transmisi?n sexual (ITS). ?Tome aspirina ?nicamente como se lo haya indicado el m?dico. Aseg?rese de que comprende qu? cantidad y cu?l presentaci?n debe tomar. Trabaje con el m?dico para averiguar si es seguro y beneficioso para usted tomar aspirina a diario. ?Preg?ntele al m?dico si necesita tomar un medicamento para reducir el colesterol (estatina). ?Busque maneras saludables de controlar el estr?s, tales como: ?Meditaci?n, yoga o escuchar m?sica. ?Lleve un diario personal. ?Hable con una persona confiable. ?Pase tiempo con amigos y familiares. ?Minimice la exposici?n a la radiaci?n UV para reducir el riesgo de c?ncer de piel. ?Seguridad ?[place] siempre el cintur?n de seguridad al conducir o viajar en un veh?culo. ?No conduzca: ?Si ha estado bebiendo alcohol. No viaje con un conductor que ha estado bebiendo. ?Si est? cansada o distra?da. ?Mientras est? enviando mensajes de texto. ?Si ha estado usando sustancias o  drogas que alteran la funci?n mental. ?Use un casco y otros equipos de protecci?n durante las actividades deportivas. ?Si tiene armas de fuego en su casa, aseg?rese de seguir todos los procedimientos de seguridad correspondientes. ??Cu?ndo volver? ?Visite al m?dico una vez al a?o para una visita anual de control de bienestar. ?Preg?ntele al m?dico con qu? frecuencia debe realizarse un control de la vista y los dientes. ?Mantenga su esquema de vacunaci?n al d?a. ?Esta informaci?n no tiene Marine scientist el consejo del m?dico. Aseg?rese de hacerle al m?dico cualquier pregunta que tenga. ?Document Revised: 09/30/2020 Document Reviewed: 09/30/2020 ?Elsevier Patient Education ? [place]. ? ?

## 2021-08-10 MED ORDER — POTASSIUM CHLORIDE CRYS ER 10 MEQ PO TBCR: EXTENDED_RELEASE_TABLET | ORAL | 3 refills | Status: DC

## 2021-08-10 NOTE — Addendum Note (Signed)
Addended by: Billey Chang on: 08/10/2021 12:41 PM ? ? Modules accepted: Orders ? ?

## 2021-08-17 ENCOUNTER — Ambulatory Visit
Admission: RE | Admit: 2021-08-17 | Discharge: 2021-08-17 | Disposition: A | Discharge status: Home / Self Care | Payer: Medicare HMO | Source: Ambulatory Visit | Attending: Adult Health | Admitting: Adult Health

## 2021-08-17 DIAGNOSIS — E2839 Other primary ovarian failure: Secondary | ICD-10-CM

## 2021-08-17 DIAGNOSIS — Z853 Personal history of malignant neoplasm of breast: Secondary | ICD-10-CM | POA: Diagnosis not present

## 2021-08-17 DIAGNOSIS — Z780 Asymptomatic menopausal state: Secondary | ICD-10-CM | POA: Diagnosis not present

## 2021-08-17 DIAGNOSIS — C50512 Malignant neoplasm of lower-outer quadrant of left female breast: Secondary | ICD-10-CM

## 2021-08-17 HISTORY — DX: Personal history of irradiation: Z92.3

## 2021-10-29 DIAGNOSIS — H524 Presbyopia: Secondary | ICD-10-CM | POA: Diagnosis not present

## 2021-11-22 ENCOUNTER — Ambulatory Visit: Payer: Medicare HMO | Attending: General Surgery

## 2021-11-22 VITALS — Wt 162.0 lb

## 2021-11-22 DIAGNOSIS — Z483 Aftercare following surgery for neoplasm: Secondary | ICD-10-CM | POA: Insufficient documentation

## 2021-11-22 NOTE — Therapy (Signed)
OUTPATIENT PHYSICAL THERAPY SOZO SCREENING NOTE   Patient Name: Debra Simon MRN: [id number] DOB:1950/06/24, 71 y.o., female Today's Date: 11/22/2021  PCP: Willow Ora, MD REFERRING PROVIDER: Almond Lint, MD   PT End of Session - 11/22/21 1535     Visit Number 1   # unchanged due to screen only   PT Start Time 1534    PT Stop Time 1538    PT Time Calculation (min) 4 min    Activity Tolerance Patient tolerated treatment well    Behavior During Therapy [facility] for tasks assessed/performed             Past Medical History:  Diagnosis Date   Anxiety    Arthritis    Breast cancer (HCC)    left breast 2022   Colon polyps 2010   Diastolic dysfunction, left ventricle 06/26/2016   (ECHO 2018): Left ventricle: The cavity size was normal. Systolic function was normal. The estimated ejection fraction was in the range of 60% to 65%. Wall motion was normal; there were no regional wall motion abnormalities. Doppler parameters are consistent with abnormal left ventricular relaxation (grade 1 diastolic dysfunction).   Diverticulosis    Family history of brain cancer    Family history of breast cancer    Family history of pancreatic cancer    Family history of uterine cancer    GERD (gastroesophageal reflux disease)    watches what she eats   Headache    gets headaches when she eats sweets   Heart murmur    dx last yr.   had echo 05/2016   History of radiation therapy    Left breast- 11/18/20-12/17/20- Dr. Antony Blackbird   Hyperlipidemia    Hypertension    Mild mitral regurgitation 07/04/2016   Personal history of radiation therapy    Pneumonia    Pre-diabetes    Prediabetes 11/11/2020   Primary localized osteoarthritis of right knee 05/24/2017   Ureterovaginal fistula    Uterine fibroid 1994   Vaginal dysplasia    Past Surgical History:  Procedure Laterality Date   BREAST CYST ASPIRATION Right    BREAST EXCISIONAL BIOPSY Right    BREAST LUMPECTOMY     BREAST LUMPECTOMY  WITH RADIOACTIVE SEED AND SENTINEL LYMPH NODE BIOPSY Left 10/15/2020   Procedure: LEFT BREAST LUMPECTOMY WITH RADIOACTIVE SEED AND SENTINEL LYMPH NODE BIOPSY;  Surgeon: Almond Lint, MD;  Location: [facility] OR;  Service: General;  Laterality: Left;   COLONOSCOPY  2010   COLONOSCOPY  2012   COLONOSCOPY  2016   CYSTOSCOPY     ESOPHAGOGASTRODUODENOSCOPY  2010   TOTAL ABDOMINAL HYSTERECTOMY  1994   TOTAL KNEE ARTHROPLASTY Right 06/05/2017   TOTAL KNEE ARTHROPLASTY Right 06/05/2017   Procedure: RIGHT TOTAL KNEE ARTHROPLASTY;  Surgeon: Salvatore Marvel, MD;  Location: [facility] OR;  Service: Orthopedics;  Laterality: Right;   Ueterovaginal fistual repair     URETERAL EXPLORATION  04/16/1992   URETEROTOMY ON THE RIGHT, REPAIR LACERATION ON THE LEFT   Patient Active Problem List   Diagnosis Date Noted   Systolic murmur 08/06/2021   Prediabetes 11/11/2020   Genetic testing 09/16/2020   Family history of breast cancer 09/10/2020   Family history of brain cancer 09/10/2020   Family history of uterine cancer 09/10/2020   Family history of pancreatic cancer 09/10/2020   Blindness right eye category 3, blindness left eye category 3 09/09/2020   Malignant neoplasm of lower-outer quadrant of left breast of female, estrogen receptor positive (HCC) 09/07/2020  Varicose veins of both lower extremities with pain 31/67/4255   Umbilical hernia without obstruction and without gangrene 07/08/2020   History of lacunar cerebrovascular accident (CVA) 09/27/2019   Serrated adenoma of colon 09/27/2019   Bilateral primary osteoarthritis of knee 06/05/2017   Diverticulosis    History of vaginal dysplasia 08/31/2016   Nonrheumatic mitral valve regurgitation 07/04/2016   Mixed hyperlipidemia 06/02/2016   Sensorineural hearing loss (SNHL), bilateral 07/07/2015   Rectocele 05/30/2014   Essential hypertension 07/31/2013    REFERRING DIAG: left breast cancer at risk for lymphedema  THERAPY DIAG: Aftercare following surgery  for neoplasm  PERTINENT HISTORY: Patient was diagnosed on 08/28/2020 with left grade I-II invasive ductal carcinoma breast cancer. She had a left lumpectomy and sentinel node biopsy on 10/15/2020 with 5 negative axillary nodes removed. It is ER/PR positive and HER2 negative with a Ki67 of 15%.   PRECAUTIONS: left UE Lymphedema risk, None  SUBJECTIVE: Pt returns for her 3 month L-Dex screen.   PAIN:  Are you having pain? No  SOZO SCREENING: Patient was assessed today using the SOZO machine to determine the lymphedema index score. This was compared to her baseline score. It was determined that she is within the recommended range when compared to her baseline and no further action is needed at this time. She will continue SOZO screenings. These are done every 3 months for 2 years post operatively followed by every 6 months for 2 years, and then annually.   L-DEX FLOWSHEETS - 11/22/21 1500       L-DEX LYMPHEDEMA SCREENING   Measurement Type Unilateral    L-DEX MEASUREMENT EXTREMITY Upper Extremity    POSITION  Standing    DOMINANT SIDE Right    At Risk Side Left    BASELINE SCORE (UNILATERAL) 2.3    L-DEX SCORE (UNILATERAL) 2.3    VALUE CHANGE (UNILAT) 0              [name], PTA 11/22/2021, 3:38 PM

## 2021-12-09 ENCOUNTER — Other Ambulatory Visit: Payer: Self-pay | Admitting: Family Medicine

## 2021-12-20 ENCOUNTER — Encounter: Payer: Self-pay | Admitting: *Deleted

## 2021-12-20 ENCOUNTER — Other Ambulatory Visit: Payer: Self-pay | Admitting: *Deleted

## 2021-12-20 MED ORDER — ANASTROZOLE 1 MG PO TABS: 1.0000 mg | ORAL_TABLET | Freq: Every day | ORAL | 4 refills | Status: DC

## 2021-12-24 ENCOUNTER — Ambulatory Visit: Payer: Medicare HMO

## 2022-01-21 ENCOUNTER — Other Ambulatory Visit: Payer: Medicare HMO

## 2022-01-21 ENCOUNTER — Ambulatory Visit: Payer: Medicare HMO | Admitting: Hematology and Oncology

## 2022-02-01 ENCOUNTER — Other Ambulatory Visit: Payer: Medicare HMO

## 2022-02-01 ENCOUNTER — Ambulatory Visit: Payer: Medicare HMO | Admitting: Hematology and Oncology

## 2022-02-04 ENCOUNTER — Other Ambulatory Visit: Payer: Self-pay

## 2022-02-04 DIAGNOSIS — C50512 Malignant neoplasm of lower-outer quadrant of left female breast: Secondary | ICD-10-CM

## 2022-02-07 ENCOUNTER — Inpatient Hospital Stay: Payer: Medicare HMO | Attending: Hematology and Oncology

## 2022-02-07 ENCOUNTER — Inpatient Hospital Stay: Payer: Medicare HMO | Admitting: Hematology and Oncology

## 2022-02-07 ENCOUNTER — Other Ambulatory Visit: Payer: Self-pay

## 2022-02-07 VITALS — BP 153/71 | HR 80 | Temp 98.1°F | Resp 16 | Ht 61.0 in | Wt 157.0 lb

## 2022-02-07 DIAGNOSIS — C50512 Malignant neoplasm of lower-outer quadrant of left female breast: Secondary | ICD-10-CM | POA: Insufficient documentation

## 2022-02-07 DIAGNOSIS — Z79899 Other long term (current) drug therapy: Secondary | ICD-10-CM | POA: Diagnosis not present

## 2022-02-07 DIAGNOSIS — Z79811 Long term (current) use of aromatase inhibitors: Secondary | ICD-10-CM | POA: Insufficient documentation

## 2022-02-07 DIAGNOSIS — Z923 Personal history of irradiation: Secondary | ICD-10-CM | POA: Insufficient documentation

## 2022-02-07 DIAGNOSIS — Z170 Estrogen receptor positive status [ER+]: Secondary | ICD-10-CM | POA: Insufficient documentation

## 2022-02-07 LAB — CBC WITH DIFFERENTIAL (CANCER CENTER ONLY)
Abs Immature Granulocytes: 0.01 10*3/uL (ref 0.00–0.07)
Basophils Absolute: 0 10*3/uL (ref 0.0–0.1)
Basophils Relative: 0 %
Eosinophils Absolute: 0.2 10*3/uL (ref 0.0–0.5)
Eosinophils Relative: 3 %
HCT: 38.1 % (ref 36.0–46.0)
Hemoglobin: 13.2 g/dL (ref 12.0–15.0)
Immature Granulocytes: 0 %
Lymphocytes Relative: 23 %
Lymphs Abs: 1.2 10*3/uL (ref 0.7–4.0)
MCH: 30.3 pg (ref 26.0–34.0)
MCHC: 34.6 g/dL (ref 30.0–36.0)
MCV: 87.6 fL (ref 80.0–100.0)
Monocytes Absolute: 0.5 10*3/uL (ref 0.1–1.0)
Monocytes Relative: 8 %
Neutro Abs: 3.6 10*3/uL (ref 1.7–7.7)
Neutrophils Relative %: 66 %
Platelet Count: 247 10*3/uL (ref 150–400)
RBC: 4.35 MIL/uL (ref 3.87–5.11)
RDW: 12.8 % (ref 11.5–15.5)
WBC Count: 5.5 10*3/uL (ref 4.0–10.5)
nRBC: 0 % (ref 0.0–0.2)

## 2022-02-07 LAB — CMP (CANCER CENTER ONLY)
ALT: 14 U/L (ref 0–44)
AST: 17 U/L (ref 15–41)
Albumin: 4.7 g/dL (ref 3.5–5.0)
Alkaline Phosphatase: 78 U/L (ref 38–126)
Anion gap: 7 (ref 5–15)
BUN: 11 mg/dL (ref 8–23)
CO2: 30 mmol/L (ref 22–32)
Calcium: 9.5 mg/dL (ref 8.9–10.3)
Chloride: 99 mmol/L (ref 98–111)
Creatinine: 0.61 mg/dL (ref 0.44–1.00)
GFR, Estimated: >60 mL/min (ref >60)
Glucose, Bld: 108 mg/dL — ABNORMAL HIGH (ref 70–99)
Potassium: 3.2 mmol/L — ABNORMAL LOW (ref 3.5–5.1)
Sodium: 136 mmol/L (ref 135–145)
Total Bilirubin: 0.6 mg/dL (ref 0.3–1.2)
Total Protein: 7.7 g/dL (ref 6.5–8.1)

## 2022-02-07 NOTE — Progress Notes (Unsigned)
[facility]  [phone]) [phone] [phone]) [phone]     ID: Debra Simon DOB: [birth year]  MR#: [id number]  JEH#:[id number]  Patient Care Team: Leamon Arnt, MD as PCP - General (Family Medicine) Elsie Saas, MD as Consulting Physician (Orthopedic Surgery) Princess Bruins, MD as Consulting Physician (Obstetrics and Gynecology) Stark Klein, MD as Consulting Physician (General Surgery) Gery Pray, MD as Consulting Physician (Radiation Oncology) Delice Bison, Charlestine Massed, NP as Nurse Practitioner (Hematology and Oncology) Harmon Pier, RN as Registered Nurse Benay Pike, MD as Consulting Physician (Hematology and Oncology) Benay Pike, MD OTHER MD:  CHIEF COMPLAINT: estrogen receptor positive breast cancer  CURRENT TREATMENT: Anastrozole   INTERVAL HISTORY: Kampbell returns today for follow up of her estrogen receptor positive breast cancer.   Since her last visit, she received adjuvant radiation treatment from 11/18/2020 through 12/17/2020 under Dr. Sondra Come.  She tolerated this remarkably well, with minimal changes in the skin and no significant fatigue.  She began anastrozole in 12/2020.  She is not having significant issues with vaginal dryness or hot flashes so far.  She has not experienced any arthralgias or myalgias   REVIEW OF SYSTEMS: Tniya walks 30 minutes every morning before breakfast.  Sometimes she feels a little bit of nausea after taking her morning medications.  A detailed review of systems today was otherwise noncontributory   COVID 19 VACCINATION STATUS: fully vaccinated, Moderna x2 with [place] booster 02/2020 as of August 2022   HISTORY OF CURRENT ILLNESS: From the original intake note:  Shelisha Zappulla had routine screening mammography on 08/06/2020 showing a possible abnormality in the left breast. She underwent left diagnostic mammography with tomography and left breast ultrasonography at [facility] on 08/28/2020  showing: breast density category C; 11 mm left breast mass at 6 o'clock, retroareolar; no left axillary lymphadenopathy.  Accordingly on 09/03/2020 she proceeded to biopsy of the left breast area in question. The pathology from this procedure ([id number]) showed: invasive ductal carcinoma, grade 1-2; ductal carcinoma in situ. Prognostic indicators significant for: estrogen receptor, >95% positive and progesterone receptor, >95% positive, both with strong staining intensity. Proliferation marker Ki67 at 15%. HER2 negative by immunohistochemistry (1+).   Cancer Staging  Malignant neoplasm of lower-outer quadrant of left breast of female, estrogen receptor positive ([place]) Staging form: Breast, AJCC 8th Edition - Clinical: Stage IA (cT1c, cN0, cM0, G2, ER+, PR+, HER2-) - Signed by Chauncey Cruel, MD on 09/08/2020 Histologic grading system: 3 grade system   The patient's subsequent history is as detailed below.   PAST MEDICAL HISTORY: Past Medical History:  Diagnosis Date   Anxiety    Arthritis    Breast cancer ([place])    left breast 2022   Colon polyps 7741   Diastolic dysfunction, left ventricle 06/26/2016   (ECHO 2018): Left ventricle: The cavity size was normal. Systolic function was normal. The estimated ejection fraction was in the range of 60% to 65%. Wall motion was normal; there were no regional wall motion abnormalities. Doppler parameters are consistent with abnormal left ventricular relaxation (grade 1 diastolic dysfunction).   Diverticulosis    Family history of brain cancer    Family history of breast cancer    Family history of pancreatic cancer    Family history of uterine cancer    GERD (gastroesophageal reflux disease)    watches what she eats   Headache    gets headaches when she eats sweets   Heart murmur    dx last yr.   had  echo 05/2016   History of radiation therapy    Left breast- 11/18/20-12/17/20- Dr. Gery Pray   Hyperlipidemia    Hypertension    Mild  mitral regurgitation 07/04/2016   Personal history of radiation therapy    Pneumonia    Pre-diabetes    Prediabetes 11/11/2020   Primary localized osteoarthritis of right knee 05/24/2017   Ureterovaginal fistula    Uterine fibroid 1994   Vaginal dysplasia     PAST SURGICAL HISTORY: Past Surgical History:  Procedure Laterality Date   BREAST CYST ASPIRATION Right    BREAST EXCISIONAL BIOPSY Right    BREAST LUMPECTOMY     BREAST LUMPECTOMY WITH RADIOACTIVE SEED AND SENTINEL LYMPH NODE BIOPSY Left 10/15/2020   Procedure: LEFT BREAST LUMPECTOMY WITH RADIOACTIVE SEED AND SENTINEL LYMPH NODE BIOPSY;  Surgeon: Stark Klein, MD;  Location: [facility];  Service: General;  Laterality: Left;   COLONOSCOPY  2010   COLONOSCOPY  2012   COLONOSCOPY  2016   CYSTOSCOPY     ESOPHAGOGASTRODUODENOSCOPY  2010   TOTAL ABDOMINAL HYSTERECTOMY  1994   TOTAL KNEE ARTHROPLASTY Right 06/05/2017   TOTAL KNEE ARTHROPLASTY Right 06/05/2017   Procedure: RIGHT TOTAL KNEE ARTHROPLASTY;  Surgeon: Elsie Saas, MD;  Location: [facility];  Service: Orthopedics;  Laterality: Right;   Ueterovaginal fistual repair     URETERAL EXPLORATION  04/16/1992   URETEROTOMY ON THE RIGHT, REPAIR LACERATION ON THE LEFT    FAMILY HISTORY: Family History  Problem Relation Age of Onset   Diabetes Mother    Hypertension Mother    Brain cancer Father        d. 32   Hypertension Sister    Diabetes Sister    Diabetes Brother    Breast cancer Paternal Aunt        dx >50   Uterine cancer Paternal Aunt        dx >50   Cancer Maternal Grandmother        unknown type, died in her 15s   Pancreatic cancer Cousin 22       maternal first cousin   Uterine cancer Cousin        dx 66s, maternal first cousin   Breast cancer Cousin 71       paternal first cousin   Her father died at age 5 from brain cancer. Her mother is 48 years old as of 08/2020. Janelle has two brothers and two sisters. She reports breast cancer in a paternal aunt (over  age 64), uterine cancer in a paternal aunt (>50) and a maternal cousin (85's), and pancreatic cancer in a maternal cousin (age 68).   GYNECOLOGIC HISTORY:  No LMP recorded. Patient has had a hysterectomy. Menarche: 71 years old Age at first live birth: 71 years old [place] P 4 LMP 02/1992, with hysterectomy Contraceptive: never used HRT never used  Hysterectomy? Yes, 02/1992 BSO? Yes?   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 Husband Bland Span works in [employer], now part time. She lives at home with her husband. Their daughter Kristopher Oppenheim, age 62, is a Psychologist, sport and exercise at [employer] here in [place]. Son Peter Garter, age 20, works in transit at a train station in [place]. Son Mckinley Jewel, age 39, is a Tour manager here in [place]. Son Johnsie Cancel, age 26, is a Tour manager in [place]. She attends a [employer].    ADVANCED DIRECTIVES: not in place. In the absence of  any documentation to the contrary, the patient's spouse is their HCPOA. She intends to name her son Mckinley Jewel as her HCPOA.   HEALTH MAINTENANCE: Social History   Tobacco Use   Smoking status: Never   Smokeless tobacco: Never  Vaping Use   Vaping Use: Never used  Substance Use Topics   Alcohol use: No   Drug use: No     Colonoscopy: 01/2020 (Dr. Loletha Carrow), recall 2026  PAP: 05/2020, negative  Bone density: 03/2017, -0.1   No Known Allergies  Current Outpatient Medications  Medication Sig Dispense Refill   anastrozole (ARIMIDEX) 1 MG tablet Take 1 tablet (1 mg total) by mouth daily. Start December 26, 2020 90 tablet 4   aspirin EC 81 MG tablet Take 1 tablet (81 mg total) by mouth daily.     losartan-hydrochlorothiazide (HYZAAR) 100-25 MG tablet TAKE 1 TABLET BY MOUTH EVERY DAY 90 tablet 3   potassium chloride (KLOR-CON M) 10 MEQ tablet Take 2 tablets (20 mEq total) by mouth daily for 7 days, THEN 1 tablet (10 mEq total) daily. 90 tablet 3    simvastatin (ZOCOR) 20 MG tablet TAKE 1 TABLET BY MOUTH EVERYDAY AT BEDTIME 90 tablet 3   No current facility-administered medications for this visit.    OBJECTIVE: Spanish speaker who appears stated age  There were no vitals filed for this visit.     There is no height or weight on file to calculate BMI.   Wt Readings from Last 3 Encounters:  11/22/21 162 lb (73.5 kg)  08/06/21 158 lb 8 oz (71.9 kg)  07/19/21 160 lb (72.6 kg)      ECOG FS:1 - Symptomatic but completely ambulatory  Sclerae unicteric, EOMs intact Wearing a mask No cervical or supraclavicular adenopathy Lungs no rales or rhonchi Heart regular rate and rhythm Abd soft, nontender, positive bowel sounds MSK no focal spinal tenderness, no upper extremity lymphedema Neuro: nonfocal, well oriented, appropriate affect Breasts: The right breast is unremarkable.  The left breast has undergone lumpectomy and radiation.  It is smaller than the right breast is slightly darker but otherwise cosmetically favorable.  There is no evidence of residual or recurrent disease per both axillae are benign.   LAB RESULTS:  CMP     Component Value Date/Time   NA 138 08/06/2021 0929   K 3.2 (L) 08/06/2021 0929   CL 100 08/06/2021 0929   CO2 29 08/06/2021 0929   GLUCOSE 97 08/06/2021 0929   BUN 12 08/06/2021 0929   CREATININE 0.61 08/06/2021 0929   CREATININE 0.69 09/09/2020 0812   CREATININE 0.57 06/01/2016 1018   CALCIUM 9.9 08/06/2021 0929   CALCIUM 9.0 09/11/2012 0000   PROT 7.4 08/06/2021 0929   ALBUMIN 4.6 08/06/2021 0929   AST 18 08/06/2021 0929   AST 17 09/09/2020 0812   ALT 13 08/06/2021 0929   ALT 16 09/09/2020 0812   ALKPHOS 64 08/06/2021 0929   ALKPHOS 78 09/11/2012 0000   BILITOT 0.7 08/06/2021 0929   BILITOT 0.4 09/09/2020 0812   GFRNONAA >60 02/16/2021 1501   GFRNONAA >60 09/09/2020 0812   GFRAA >60 06/06/2017 0404    No results found for: "TOTALPROTELP", "ALBUMINELP", "A1GS", "A2GS", "BETS",  "BETA2SER", "GAMS", "MSPIKE", "SPEI"  Lab Results  Component Value Date   WBC 5.5 02/07/2022   NEUTROABS 3.6 02/07/2022   HGB 13.2 02/07/2022   HCT 38.1 02/07/2022   MCV 87.6 02/07/2022   PLT 247 02/07/2022    No results found for: "LABCA2"  No components  found for: "ZHYQMV784"  No results for input(s): "INR" in the last 168 hours.  No results found for: "LABCA2"  No results found for: "ONG295"  No results found for: "CAN125"  No results found for: "CAN153"  No results found for: "CA2729"  No components found for: "HGQUANT"  No results found for: "CEA1", "CEA" / No results found for: "CEA1", "CEA"   No results found for: "AFPTUMOR"  No results found for: "CHROMOGRNA"  No results found for: "KPAFRELGTCHN", "LAMBDASER", "KAPLAMBRATIO" (kappa/lambda light chains)  No results found for: "HGBA", "HGBA2QUANT", "HGBFQUANT", "HGBSQUAN" (Hemoglobinopathy evaluation)   No results found for: "LDH"  No results found for: "IRON", "TIBC", "IRONPCTSAT" (Iron and TIBC)  No results found for: "FERRITIN"  Urinalysis    Component Value Date/Time   COLORURINE YELLOW 09/27/2019 [place] 09/27/2019 1040   LABSPEC 1.015 09/27/2019 1040   PHURINE 8.0 09/27/2019 [place] 09/27/2019 [place] 09/27/2019 [place] 09/27/2019 1040   BILIRUBINUR Negative 03/14/2017 0944   KETONESUR NEGATIVE 09/27/2019 1040   PROTEINUR NEGATIVE 01/09/2019 1117   UROBILINOGEN 0.2 09/27/2019 1040   NITRITE NEGATIVE 09/27/2019 1040   LEUKOCYTESUR NEGATIVE 09/27/2019 1040    STUDIES: No results found.   ELIGIBLE FOR AVAILABLE RESEARCH PROTOCOL: No  ASSESSMENT: 71 y.o. [place] woman s/p Left breast lower outer quadrant biopsy 09/03/2020 for a clinical T1c N0, stage IA invasive ductal carcinoma, grade I-II, estrogen and progesterone receptor positive, HER2 not amplified, with an Mib-1 of 15%  (1) genetics testing through the  [facility] BRCAplus panel (report date 09/15/2020) or the CancerNext-Expanded + RNAinsight panel (report date 09/19/2020) found no deleterious mutations in ATM, BRCA1, BRCA2, CDH1, CHEK2, PALB2, PTEN, and TP53. The CancerNext-Expanded + RNAinsight gene panel offered by [facility] [employer] and includes sequencing and rearrangement analysis for the following 77 genes: AIP, ALK, APC, ATM, AXIN2, BAP1, BARD1, BLM, BMPR1A, BRCA1, BRCA2, BRIP1, CDC73, CDH1, CDK4, CDKN1B, CDKN2A, CHEK2, CTNNA1, DICER1, FANCC, FH, FLCN, GALNT12, KIF1B, LZTR1, MAX, MEN1, MET, MLH1, MSH2, MSH3, MSH6, MUTYH, NBN, NF1, NF2, NTHL1, PALB2, PHOX2B, PMS2, POT1, PRKAR1A, PTCH1, PTEN, RAD51C, RAD51D, RB1, RECQL, RET, SDHA, SDHAF2, SDHB, SDHC, SDHD, SMAD4, SMARCA4, SMARCB1, SMARCE1, STK11, SUFU, TMEM127, TP53, TSC1, TSC2, VHL and XRCC2 (sequencing and deletion/duplication); EGFR, EGLN1, HOXB13, KIT, MITF, PDGFRA, POLD1 and POLE (sequencing only); EPCAM and GREM1 (deletion/duplication only). RNA data is routinely analyzed for use in variant interpretation for all genes.  (2) status post left lumpectomy and sentinel lymph node sampling 10/15/2020 for a pT1c pN0, stage IA invasive ductal carcinoma, grade 2, with negative margins (A) a total of 5 left axillary lymph nodes were removed  (3) Oncotype score of 15 predicts a risk of recurrence outside the breast in the next 9 years of 4% if the patient's only systemic therapy is antiestrogens for 5 years.  It also predicts no benefit from chemotherapy  (4) adjuvant radiation 11/18/2020 through 12/17/2020 Site Technique Total Dose (Gy) Dose per Fx (Gy) Completed Fx Beam Energies  Breast, Left: Breast_Lt 3D 40.05/40.05 2.67 15/15 6XFFF  Breast, Left: Breast_Lt_Bst 3D 12/12 2 6/6 6X    (5) started anastrozole 12/26/2020 (A) [facility] at [facility] 04/18/2017 was normal   PLAN:    *Total Encounter Time as defined by the Centers for Medicare and Medicaid Services includes, in  addition to the face-to-face time of a patient visit (documented in the note above) non-face-to-face time: obtaining and reviewing outside history, ordering and reviewing medications, tests  or procedures, care coordination (communications with other health care professionals or caregivers) and documentation in the medical record.

## 2022-02-08 ENCOUNTER — Ambulatory Visit: Payer: Medicare HMO | Admitting: Family Medicine

## 2022-02-09 ENCOUNTER — Encounter: Payer: Self-pay | Admitting: Hematology and Oncology

## 2022-02-10 ENCOUNTER — Ambulatory Visit (INDEPENDENT_AMBULATORY_CARE_PROVIDER_SITE_OTHER): Payer: Medicare HMO | Admitting: Family Medicine

## 2022-02-10 ENCOUNTER — Encounter: Payer: Self-pay | Admitting: Family Medicine

## 2022-02-10 VITALS — BP 118/70 | HR 70 | Temp 97.8°F | Ht 61.0 in | Wt 157.6 lb

## 2022-02-10 DIAGNOSIS — Z23 Encounter for immunization: Secondary | ICD-10-CM | POA: Diagnosis not present

## 2022-02-10 DIAGNOSIS — E786 Lipoprotein deficiency: Secondary | ICD-10-CM

## 2022-02-10 DIAGNOSIS — T502X5A Adverse effect of carbonic-anhydrase inhibitors, benzothiadiazides and other diuretics, initial encounter: Secondary | ICD-10-CM

## 2022-02-10 DIAGNOSIS — Z170 Estrogen receptor positive status [ER+]: Secondary | ICD-10-CM | POA: Diagnosis not present

## 2022-02-10 DIAGNOSIS — R7303 Prediabetes: Secondary | ICD-10-CM | POA: Diagnosis not present

## 2022-02-10 DIAGNOSIS — E876 Hypokalemia: Secondary | ICD-10-CM

## 2022-02-10 DIAGNOSIS — C50512 Malignant neoplasm of lower-outer quadrant of left female breast: Secondary | ICD-10-CM | POA: Diagnosis not present

## 2022-02-10 DIAGNOSIS — I10 Essential (primary) hypertension: Secondary | ICD-10-CM | POA: Diagnosis not present

## 2022-02-10 DIAGNOSIS — E785 Hyperlipidemia, unspecified: Secondary | ICD-10-CM | POA: Diagnosis not present

## 2022-02-10 DIAGNOSIS — Z8673 Personal history of transient ischemic attack (TIA), and cerebral infarction without residual deficits: Secondary | ICD-10-CM

## 2022-02-10 LAB — BASIC METABOLIC PANEL
BUN: 10 mg/dL (ref 6–23)
CO2: 29 mEq/L (ref 19–32)
Calcium: 9.4 mg/dL (ref 8.4–10.5)
Chloride: 97 mEq/L (ref 96–112)
Creatinine, Ser: 0.56 mg/dL (ref 0.40–1.20)
GFR: 92.58 mL/min (ref >60.00)
Glucose, Bld: 99 mg/dL (ref 70–99)
Potassium: 3.4 mEq/L — ABNORMAL LOW (ref 3.5–5.1)
Sodium: 133 mEq/L — ABNORMAL LOW (ref 135–145)

## 2022-02-10 MED ORDER — SIMVASTATIN 20 MG PO TABS: ORAL_TABLET | ORAL | 3 refills | Status: DC

## 2022-02-10 NOTE — Progress Notes (Signed)
Subjective  CC:  Chief Complaint  Patient presents with   Hypertension    Pt here for 63mo F/U with Bp    HPI: Debra Chandranis a 71y.o. female who presents to the office today to address the problems listed above in the chief complaint. Hypertension f/u: Control is good . Pt reports she is doing well. taking medications as instructed, no medication side effects noted, no TIAs, no chest pain on exertion, no dyspnea on exertion, no swelling of ankles. On hyzaar; had low K in may and now on daily 125m potassium supplement. Due for recheck. She denies adverse effects from his BP medications. Compliance with medication is good.  Mild nausea and lack of appetite related to arimedex. Reviewed recent onc visit. Doing well overall.  H/o CVA; she stopped her baby asa after surgery and never restarted it.  HLD is at goal on simvastatin 20; due for refill. H/o prediabetes; no sxs of hyperglycemia. Eating well: mostly veggies and walks daily for exercise.   Assessment  1. Essential hypertension   2. Diuretic-induced hypokalemia   3. Prediabetes   4. Malignant neoplasm of lower-outer quadrant of left breast of female, estrogen receptor positive ([facility]  5. History of lacunar cerebrovascular accident (CVA)   6. Hyperlipidemia with low HDL      Plan   Hypertension f/u: BP control is well controlled. Recheck potassium and renal function. Adjust potassium supplement if needed. Education given Prediabetes: recheck at cpe in may; continue diet and exercise Restart asa '81mg'$  daily given h/o CVA Breast cancer treatment ongoing and stable. Taking arimidex at night.  Refilled simvastatin 20 nightly. Ldl at goal.  Flu vaccine today  Education regarding management of these chronic disease states was given. Management strategies discussed on successive visits include dietary and exercise recommendations, goals of achieving and maintaining IBW, and lifestyle modifications aiming for adequate sleep and  minimizing stressors.   Follow up: 6 mo for cpe  Orders Placed This Encounter  Procedures   Basic metabolic panel   Meds ordered this encounter  Medications   simvastatin (ZOCOR) 20 MG tablet    Sig: TAKE 1 TABLET BY MOUTH EVERYDAY AT BEDTIME    Dispense:  90 tablet    Refill:  3      BP Readings from Last 3 Encounters:  02/10/22 118/70  02/07/22 (!) 153/71  08/06/21 120/64   Wt Readings from Last 3 Encounters:  02/10/22 157 lb 9.6 oz (71.5 kg)  02/07/22 157 lb (71.2 kg)  11/22/21 162 lb (73.5 kg)    Lab Results  Component Value Date   CHOL 126 08/06/2021   CHOL 102 07/08/2020   CHOL 136 09/27/2019   Lab Results  Component Value Date   HDL 47.90 08/06/2021   HDL 44.50 07/08/2020   HDL 44.70 09/27/2019   Lab Results  Component Value Date   LDLCALC 61 08/06/2021   LDLCALC 42 07/08/2020   LDLCALC 73 09/27/2019   Lab Results  Component Value Date   TRIG 85.0 08/06/2021   TRIG 80.0 07/08/2020   TRIG 94.0 09/27/2019   Lab Results  Component Value Date   CHOLHDL 3 08/06/2021   CHOLHDL 2 07/08/2020   CHOLHDL 3 09/27/2019   No results found for: "LDLDIRECT" Lab Results  Component Value Date   CREATININE 0.61 02/07/2022   BUN 11 02/07/2022   NA 136 02/07/2022   K 3.2 (L) 02/07/2022   CL 99 02/07/2022   CO2 30 02/07/2022  The ASCVD Risk score (Arnett DK, et al., 2019) failed to calculate for the following reasons:   The valid total cholesterol range is 130 to 320 mg/dL  I reviewed the patients updated PMH, FH, and SocHx.    Patient Active Problem List   Diagnosis Date Noted   Prediabetes 11/11/2020    Priority: High   Malignant neoplasm of lower-outer quadrant of left breast of female, estrogen receptor positive ([place]) 09/07/2020    Priority: High   Serrated adenoma of colon 09/27/2019    Priority: High   Mixed hyperlipidemia 06/02/2016    Priority: High   Essential hypertension 07/31/2013    Priority: High   Blindness right eye category 3,  blindness left eye category 3 09/09/2020    Priority: Medium    Umbilical hernia without obstruction and without gangrene 07/08/2020    Priority: Medium    History of lacunar cerebrovascular accident (CVA) 09/27/2019    Priority: Medium    Bilateral primary osteoarthritis of knee 06/05/2017    Priority: Medium    History of vaginal dysplasia 08/31/2016    Priority: Medium    Systolic murmur 10/93/2355    Priority: Low   Varicose veins of both lower extremities with pain 07/08/2020    Priority: Low   Diverticulosis     Priority: Low   Nonrheumatic mitral valve regurgitation 07/04/2016    Priority: Low   Sensorineural hearing loss (SNHL), bilateral 07/07/2015    Priority: Low   Rectocele 05/30/2014    Priority: Low   Genetic testing 09/16/2020   Family history of breast cancer 09/10/2020   Family history of brain cancer 09/10/2020   Family history of uterine cancer 09/10/2020   Family history of pancreatic cancer 09/10/2020    Allergies: Patient has no known allergies.  Social History: Patient  reports that she has never smoked. She has never used smokeless tobacco. She reports that she does not drink alcohol and does not use drugs.  Current Meds  Medication Sig   anastrozole (ARIMIDEX) 1 MG tablet Take 1 tablet (1 mg total) by mouth daily. Start December 26, 2020   losartan-hydrochlorothiazide (HYZAAR) 100-25 MG tablet TAKE 1 TABLET BY MOUTH EVERY DAY   potassium chloride (KLOR-CON M) 10 MEQ tablet Take 2 tablets (20 mEq total) by mouth daily for 7 days, THEN 1 tablet (10 mEq total) daily.   [DISCONTINUED] simvastatin (ZOCOR) 20 MG tablet TAKE 1 TABLET BY MOUTH EVERYDAY AT BEDTIME    Review of Systems: Cardiovascular: negative for chest pain, palpitations, leg swelling, orthopnea Respiratory: negative for SOB, wheezing or persistent cough Gastrointestinal: negative for abdominal pain Genitourinary: negative for dysuria or gross hematuria  Objective  Vitals: BP 118/70    Pulse 70   Temp 97.8 F (36.6 C)   Ht '5\' 1"'$  (1.549 m)   Wt 157 lb 9.6 oz (71.5 kg)   SpO2 97%   BMI 29.78 kg/m  General: no acute distress  Psych:  Alert and oriented, normal mood and affect HEENT:  Normocephalic, atraumatic, supple neck  Cardiovascular:  RRR with murmur. no edema Respiratory:  Good breath sounds bilaterally, CTAB with normal respiratory effort Skin:  Warm, no rashes Neurologic:   Mental status is normal Commons side effects, risks, benefits, and alternatives for medications and treatment plan prescribed today were discussed, and the patient expressed understanding of the given instructions. Patient is instructed to call or message via MyChart if he/she has any questions or concerns regarding our treatment plan. No barriers to understanding were  identified. We discussed Red Flag symptoms and signs in detail. Patient expressed understanding regarding what to do in case of urgent or emergency type symptoms.  Medication list was reconciled, printed and provided to the patient in AVS. Patient instructions and summary information was reviewed with the patient as documented in the AVS. This note was prepared with assistance of Dragon voice recognition software. Occasional wrong-word or sound-a-like substitutions may have occurred due to the inherent limitation

## 2022-02-10 NOTE — Patient Instructions (Signed)
Please return in 6 months for your annual complete physical; please come fasting.   I will release your lab results to you on your MyChart account with further instructions. You may see the results before I do, but when I review them I will send you a message with my report or have my assistant call you if things need to be discussed. Please reply to my message with any questions. Thank you!   If you have any questions or concerns, please don't hesitate to send me a message via MyChart or call the office at [phone]. Thank you for visiting with [place] today! It's our pleasure caring for you.   Hipertensin en los adultos Hypertension, Adult La presin arterial alta (hipertensin) se produce cuando la fuerza de la sangre bombea a travs de las arterias con mucha fuerza. Las arterias son los vasos sanguneos que transportan la sangre desde el corazn al resto del cuerpo. La hipertensin hace que el corazn haga ms esfuerzo para Chiropodist y [employer] que las arterias se Teacher, music o Advertising account executive. La hipertensin no tratada o no controlada puede causar infarto de miocardio, insuficiencia cardaca, accidente cerebrovascular, enfermedad renal y otros problemas. Una lectura de la presin arterial consta de un nmero ms alto sobre un nmero ms bajo. En condiciones ideales, la presin arterial debe estar por debajo de 120/80. El primer nmero ("superior") es la presin sistlica. Es la medida de la presin de las arterias cuando el corazn late. El segundo nmero ("inferior") es la presin diastlica. Es la medida de la presin en las arterias cuando el corazn se relaja. Cules son las causas? Se desconoce la causa exacta de esta afeccin. Hay algunas afecciones que causan presin arterial alta. Qu incrementa el riesgo? Ciertos factores pueden hacer que una persona sea ms propensa a desarrollar presin arterial alta. Algunos de [employer] de riesgo estn bajo su control, por ejemplo, los  siguientes: Fumar. No hacer la cantidad suficiente de actividad fsica o ejercicio. Tener sobrepeso. Consumir mucha grasa, azcar, caloras o sal (sodio) en su dieta. Beber alcohol en exceso. Otros factores de riesgo son los siguientes: Tener antecedentes personales de enfermedad cardaca, diabetes, colesterol alto o enfermedad renal. Estrs. Tener antecedentes familiares de presin arterial alta y colesterol alto. Tener apnea obstructiva del sueo. Edad. El riesgo aumenta con la edad. Cules son los signos o sntomas? Es posible que la presin arterial alta no cause sntomas. La presin arterial muy alta (crisis hipertensiva) puede provocar: Dolor de cabeza. Latidos cardacos acelerados o irregulares (palpitaciones). Falta de aire. Hemorragia nasal. Nuseas y vmitos. Cambios en la visin. Dolor intenso en el pecho, mareos y convulsiones. Cmo se diagnostica? Esta afeccin se diagnostica al medir su presin arterial mientras se encuentra sentado, con el brazo apoyado sobre una superficie plana, las piernas sin cruzar y los pies bien apoyados en el piso. El brazalete del tensimetro debe colocarse directamente sobre la piel de la parte superior del brazo y al nivel de su corazn. La presin arterial debe medirse al [employer] veces en el mismo brazo. Determinadas condiciones pueden causar una diferencia de presin arterial entre el brazo izquierdo y Insurance underwriter. Si tiene [place] de presin arterial alta durante una visita o si tiene presin arterial normal con otros factores de riesgo, se le podr pedir que haga lo siguiente: Que regrese otro da para volver a Chief Technology Officer su presin arterial nuevamente. Que se controle la presin arterial en su casa durante 1 semana o ms. Si se le diagnostica hipertensin,  es posible que se le realicen otros anlisis de sangre o estudios de diagnstico por imgenes para ayudar a su mdico a comprender su riesgo general de tener otras afecciones. Cmo  se trata? Esta afeccin se trata haciendo cambios saludables en el estilo de vida, tales como ingerir alimentos saludables, realizar ms ejercicio y reducir el consumo de alcohol. Es posible que lo deriven para que reciba asesoramiento sobre una dieta saludable y Kandace Blitz fsica. El mdico puede recetarle medicamentos si los cambios en el estilo de vida no son suficientes para Child psychotherapist la presin arterial y si: Su presin arterial sistlica est por encima de 130. Su presin arterial diastlica est por encima de 80. La presin arterial deseada puede variar en funcin de las enfermedades, la edad y otros factores personales. Siga estas instrucciones en su casa: Comida y bebida  Siga una dieta con alto contenido de fibras y [place], y con bajo contenido de sodio, Location manager agregada y Physicist, medical. Un ejemplo de este plan de alimentacin se denomina dieta DASH. DASH es la sigla en ingls de "Enfoques alimentarios para detener la hipertensin". Para alimentarse de esta manera: Coma mucha fruta y [place]. Trate de que la mitad del plato de cada comida sea de frutas y verduras. Coma cereales integrales, como pasta integral, arroz integral o pan integral. Llene aproximadamente un cuarto del plato con cereales integrales. Coma y beba productos lcteos con bajo contenido de grasa, como leche descremada o yogur bajo en grasas. Evite la ingesta de cortes de carne grasa, carne procesada o curada, y carne de ave con piel. Llene aproximadamente un cuarto del plato con protenas magras, como pescado, pollo sin piel, frijoles, huevos o tofu. Evite ingerir alimentos prehechos y procesados. En general, estos tienen mayor cantidad de sodio, azcar agregada y Wendee Copp. Reduzca su ingesta diaria de sodio. Muchas personas que tienen hipertensin deben comer menos de 1,500 mg de [employer]. No beba alcohol si: Su mdico le indica no hacerlo. Est embarazada, puede estar embarazada o est tratando de [place]. Si bebe alcohol: Limite la cantidad que bebe a lo siguiente: De 0 a 1 medida por da para las mujeres. De 0 a 2 medidas por da para los hombres. Sepa cunta cantidad de alcohol hay en las bebidas que toma. En los [place], una medida equivale a una botella de cerveza de 12 oz (355 ml), un vaso de vino de 5 oz (148 ml) o un vaso de una bebida alcohlica de alta graduacin de 1 oz (44 ml). Estilo de vida  Trabaje con su mdico para mantener un peso saludable o Administrator, Civil Service. Pregntele cul es el peso recomendado para usted. Realice al menos 30 minutos de ejercicio que haga que se acelere su corazn (ejercicio Arboriculturist) la [employer] de la [place]. Estas actividades pueden incluir caminar, nadar o andar en bicicleta. Incluya ejercicios para fortalecer sus msculos (ejercicios de resistencia), como [place] o levantamiento de pesas, como parte de su rutina semanal de ejercicios. Intente realizar 30 minutos de este tipo de ejercicios al [employer] a la [place]. No consuma ningn producto que contenga nicotina o tabaco. Estos productos incluyen cigarrillos, tabaco para Higher education careers adviser y aparatos de vapeo, como los Psychologist, sport and exercise. Si necesita ayuda para dejar de fumar, consulte al mdico. Contrlese la presin arterial en su casa segn las indicaciones del mdico. Concurra a todas las visitas de seguimiento. Esto es importante. Medicamentos Use los medicamentos de venta libre y los recetados solamente como se lo  haya indicado el [place] cuidadosamente las indicaciones. Los medicamentos para la presin arterial deben tomarse segn las indicaciones. No omita las dosis de medicamentos para la presin arterial. Si lo hace, estar en riesgo de tener problemas y puede hacer que los medicamentos sean menos eficaces. Pregntele a su mdico a qu efectos secundarios o reacciones a los Careers information officer. Comunquese con un mdico si: Piensa que tiene una  reaccin a un medicamento que est tomando. Tiene dolores de cabeza frecuentes (recurrentes). Se siente mareado. Tiene hinchazn en los tobillos. Tiene problemas de visin. Solicite ayuda inmediatamente si: Siente un dolor de cabeza intenso o confusin. Siente debilidad inusual o adormecimiento. Siente que va a desmayarse. Siente un dolor intenso en el pecho o el abdomen. Vomita repetidas veces. Tiene dificultad para respirar. Estos sntomas pueden Sales executive. Solicite ayuda de inmediato. Llame al 911. No espere a ver si los sntomas desaparecen. No conduzca por sus propios medios [employer] hospital. Resumen La hipertensin se produce cuando la sangre bombea en las arterias con mucha fuerza. Si esta afeccin no se controla, podra correr riesgo de tener complicaciones graves. La presin arterial deseada puede variar en funcin de las enfermedades, la edad y otros factores personales. Para la [employer], una presin arterial normal es menor que 120/80. La hipertensin se trata con cambios en el estilo de vida, medicamentos o una combinacin de [place]. Los [employer] estilo de vida incluyen prdida de peso, ingerir alimentos sanos, seguir una dieta baja en sodio, hacer ms ejercicio y Environmental consultant consumo de alcohol. Esta informacin no tiene Marine scientist el consejo del mdico. Asegrese de hacerle al mdico cualquier pregunta que tenga. Document Revised: 02/03/2021 Document Reviewed: 02/03/2021 Elsevier Patient Education  [place].

## 2022-02-11 ENCOUNTER — Other Ambulatory Visit: Payer: Self-pay

## 2022-02-11 MED ORDER — POTASSIUM CHLORIDE CRYS ER 20 MEQ PO TBCR: 20.0000 meq | EXTENDED_RELEASE_TABLET | Freq: Every day | ORAL | 3 refills | Status: DC

## 2022-02-11 NOTE — Progress Notes (Signed)
Please call patient: I have reviewed his/her lab results. Please let her know her potassium is still low: please increase to kdur 30mq daily along with the bp pill. Can order the larger pill for her. thanks

## 2022-02-19 DIAGNOSIS — N3001 Acute cystitis with hematuria: Secondary | ICD-10-CM | POA: Diagnosis not present

## 2022-02-19 DIAGNOSIS — Z20822 Contact with and (suspected) exposure to covid-19: Secondary | ICD-10-CM | POA: Diagnosis not present

## 2022-02-19 DIAGNOSIS — R509 Fever, unspecified: Secondary | ICD-10-CM | POA: Diagnosis not present

## 2022-03-07 ENCOUNTER — Ambulatory Visit: Payer: Medicare HMO | Attending: General Surgery

## 2022-03-07 VITALS — Wt 156.5 lb

## 2022-03-07 DIAGNOSIS — Z483 Aftercare following surgery for neoplasm: Secondary | ICD-10-CM | POA: Insufficient documentation

## 2022-03-07 NOTE — Therapy (Signed)
OUTPATIENT PHYSICAL THERAPY SOZO SCREENING NOTE   Patient Name: Debra Simon MRN: [id number] DOB:1950-12-03, 71 y.o., female Today's Date: 03/07/2022  PCP: Leamon Arnt, MD REFERRING PROVIDER: Leamon Arnt, MD   PT End of Session - 03/07/22 1507     Visit Number 1   # unchanged due to screen only   PT Start Time 9767    PT Stop Time 1509    PT Time Calculation (min) 4 min    Activity Tolerance Patient tolerated treatment well    Behavior During Therapy [facility] for tasks assessed/performed             Past Medical History:  Diagnosis Date   Anxiety    Arthritis    Breast cancer ([place])    left breast 2022   Colon polyps 3419   Diastolic dysfunction, left ventricle 06/26/2016   (ECHO 2018): Left ventricle: The cavity size was normal. Systolic function was normal. The estimated ejection fraction was in the range of 60% to 65%. Wall motion was normal; there were no regional wall motion abnormalities. Doppler parameters are consistent with abnormal left ventricular relaxation (grade 1 diastolic dysfunction).   Diverticulosis    Family history of brain cancer    Family history of breast cancer    Family history of pancreatic cancer    Family history of uterine cancer    GERD (gastroesophageal reflux disease)    watches what she eats   Headache    gets headaches when she eats sweets   Heart murmur    dx last yr.   had echo 05/2016   History of radiation therapy    Left breast- 11/18/20-12/17/20- Dr. Gery Pray   Hyperlipidemia    Hypertension    Mild mitral regurgitation 07/04/2016   Personal history of radiation therapy    Pneumonia    Pre-diabetes    Prediabetes 11/11/2020   Primary localized osteoarthritis of right knee 05/24/2017   Ureterovaginal fistula    Uterine fibroid 1994   Vaginal dysplasia    Past Surgical History:  Procedure Laterality Date   BREAST CYST ASPIRATION Right    BREAST EXCISIONAL BIOPSY Right    BREAST LUMPECTOMY     BREAST  LUMPECTOMY WITH RADIOACTIVE SEED AND SENTINEL LYMPH NODE BIOPSY Left 10/15/2020   Procedure: LEFT BREAST LUMPECTOMY WITH RADIOACTIVE SEED AND SENTINEL LYMPH NODE BIOPSY;  Surgeon: Stark Klein, MD;  Location: [place];  Service: General;  Laterality: Left;   COLONOSCOPY  2010   COLONOSCOPY  2012   COLONOSCOPY  2016   CYSTOSCOPY     ESOPHAGOGASTRODUODENOSCOPY  2010   TOTAL ABDOMINAL HYSTERECTOMY  1994   TOTAL KNEE ARTHROPLASTY Right 06/05/2017   TOTAL KNEE ARTHROPLASTY Right 06/05/2017   Procedure: RIGHT TOTAL KNEE ARTHROPLASTY;  Surgeon: Elsie Saas, MD;  Location: [place];  Service: Orthopedics;  Laterality: Right;   Ueterovaginal fistual repair     URETERAL EXPLORATION  04/16/1992   URETEROTOMY ON THE RIGHT, REPAIR LACERATION ON THE LEFT   Patient Active Problem List   Diagnosis Date Noted   Systolic murmur 37/90/2409   Prediabetes 11/11/2020   Genetic testing 09/16/2020   Family history of breast cancer 09/10/2020   Family history of brain cancer 09/10/2020   Family history of uterine cancer 09/10/2020   Family history of pancreatic cancer 09/10/2020   Blindness right eye category 3, blindness left eye category 3 09/09/2020   Malignant neoplasm of lower-outer quadrant of left breast of female, estrogen receptor positive ([place]) 09/07/2020  Varicose veins of both lower extremities with pain 00/92/3300   Umbilical hernia without obstruction and without gangrene 07/08/2020   History of lacunar cerebrovascular accident (CVA) 09/27/2019   Serrated adenoma of colon 09/27/2019   Bilateral primary osteoarthritis of knee 06/05/2017   Diverticulosis    History of vaginal dysplasia 08/31/2016   Nonrheumatic mitral valve regurgitation 07/04/2016   Mixed hyperlipidemia 06/02/2016   Sensorineural hearing loss (SNHL), bilateral 07/07/2015   Rectocele 05/30/2014   Essential hypertension 07/31/2013    REFERRING DIAG: left breast cancer at risk for lymphedema  THERAPY DIAG: Aftercare  following surgery for neoplasm  PERTINENT HISTORY: Patient was diagnosed on 08/28/2020 with left grade I-II invasive ductal carcinoma breast cancer. She had a left lumpectomy and sentinel node biopsy on 10/15/2020 with 5 negative axillary nodes removed. It is ER/PR positive and HER2 negative with a Ki67 of 15%.   PRECAUTIONS: left UE Lymphedema risk, None  SUBJECTIVE: Pt returns for her 3 month L-Dex screen.   PAIN:  Are you having pain? No  SOZO SCREENING: Patient was assessed today using the SOZO machine to determine the lymphedema index score. This was compared to her baseline score. It was determined that she is within the recommended range when compared to her baseline and no further action is needed at this time. She will continue SOZO screenings. These are done every 3 months for 2 years post operatively followed by every 6 months for 2 years, and then annually.   L-DEX FLOWSHEETS - 03/07/22 1500       L-DEX LYMPHEDEMA SCREENING   Measurement Type Unilateral    L-DEX MEASUREMENT EXTREMITY Upper Extremity    POSITION  Standing    DOMINANT SIDE Right    At Risk Side Left    BASELINE SCORE (UNILATERAL) 2.3    L-DEX SCORE (UNILATERAL) 5.3    VALUE CHANGE (UNILAT) 3              [name], PTA 03/07/2022, 3:08 PM

## 2022-03-31 ENCOUNTER — Encounter: Payer: Self-pay | Admitting: Family Medicine

## 2022-03-31 ENCOUNTER — Ambulatory Visit (INDEPENDENT_AMBULATORY_CARE_PROVIDER_SITE_OTHER): Payer: Medicare HMO | Admitting: Family Medicine

## 2022-03-31 VITALS — BP 138/64 | HR 79 | Temp 98.3°F | Ht 61.0 in | Wt 155.8 lb

## 2022-03-31 DIAGNOSIS — Z87448 Personal history of other diseases of urinary system: Secondary | ICD-10-CM | POA: Diagnosis not present

## 2022-03-31 DIAGNOSIS — Z9889 Other specified postprocedural states: Secondary | ICD-10-CM | POA: Diagnosis not present

## 2022-03-31 DIAGNOSIS — N390 Urinary tract infection, site not specified: Secondary | ICD-10-CM

## 2022-03-31 LAB — URINALYSIS, ROUTINE W REFLEX MICROSCOPIC
Bilirubin Urine: NEGATIVE
Ketones, ur: NEGATIVE
Leukocytes,Ua: NEGATIVE
Nitrite: NEGATIVE
RBC / HPF: NONE SEEN (ref 0–?)
Specific Gravity, Urine: 1.015 (ref 1.000–1.030)
Total Protein, Urine: NEGATIVE
Urine Glucose: NEGATIVE
Urobilinogen, UA: 0.2 (ref 0.0–1.0)
pH: 8 (ref 5.0–8.0)

## 2022-03-31 NOTE — Patient Instructions (Signed)
Please follow up as scheduled for your next visit with me: 08/10/2022   I will await your urine studies and start antibiotics if infection is present.  I have referred you to urology. They will call you with an appointment.   If you have any questions or concerns, please don't hesitate to send me a message via MyChart or call the office at [phone]. Thank you for visiting with Debra Simon today! It's our pleasure caring for you.

## 2022-03-31 NOTE — Progress Notes (Signed)
Subjective  CC:  Chief Complaint  Patient presents with   Follow-up    UTI. Pt was seen at Urgent Care fotr a UTI and is still feeling some burning when she urinates and some back pain. Has completed all antibiotics     HPI: Debra Simon is a 72 y.o. female who presents to the office today to address the problems listed above in the chief complaint. 72 year old reports recent urinary tract infection associated with high fever, reports to 104, urinary irritative symptoms without flank pain.  Seen at local urgent care treated with Cipro 500 twice daily for 7 days.  She reports that she improved.  However a week and half ago she has a burning sensation in her left flank.  Mild increase in urinary frequency but no other irritative urinary symptoms.  No gross hematuria.  No fevers.  She is concerned she has a recurrent infection.  She also reports that she has a history of ureterovaginal fistula requiring surgical repair, she brings in the surgical report for review.  This was many years ago (1994).  She used to follow-up with a urologist and requests a referral again.    Assessment  1. Recurrent UTI (urinary tract infection)   2. History of bladder surgery   3. History of pyelonephritis      Plan  Possible recurrent UTI: Check urine with micro and urine culture.  Treat if positive.  Current clinical symptoms are unclear.  She does not have flank tenderness or fever.  Will get records from urgent care as well for my review. Refer to urology per pt request. Recommend patient bring in old records to that appt.  Follow up: as scheduled for cpe  08/10/2022  Orders Placed This Encounter  Procedures   Urine Culture   Urinalysis, Routine w reflex microscopic   Ambulatory referral to Urology   No orders of the defined types were placed in this encounter.     I reviewed the patients updated PMH, FH, and SocHx.    Patient Active Problem List   Diagnosis Date Noted   Prediabetes 11/11/2020     Priority: High   Malignant neoplasm of lower-outer quadrant of left breast of female, estrogen receptor positive ([place]) 09/07/2020    Priority: High   Serrated adenoma of colon 09/27/2019    Priority: High   Mixed hyperlipidemia 06/02/2016    Priority: High   Essential hypertension 07/31/2013    Priority: High   Blindness right eye category 3, blindness left eye category 3 09/09/2020    Priority: Medium    Umbilical hernia without obstruction and without gangrene 07/08/2020    Priority: Medium    History of lacunar cerebrovascular accident (CVA) 09/27/2019    Priority: Medium    Bilateral primary osteoarthritis of knee 06/05/2017    Priority: Medium    History of vaginal dysplasia 08/31/2016    Priority: Medium    Systolic murmur 50/27/7412    Priority: Low   Varicose veins of both lower extremities with pain 07/08/2020    Priority: Low   Diverticulosis     Priority: Low   Nonrheumatic mitral valve regurgitation 07/04/2016    Priority: Low   Sensorineural hearing loss (SNHL), bilateral 07/07/2015    Priority: Low   Rectocele 05/30/2014    Priority: Low   Genetic testing 09/16/2020   Family history of breast cancer 09/10/2020   Family history of brain cancer 09/10/2020   Family history of uterine cancer 09/10/2020   Family history  of pancreatic cancer 09/10/2020   Current Meds  Medication Sig   anastrozole (ARIMIDEX) 1 MG tablet Take 1 tablet (1 mg total) by mouth daily. Start December 26, 2020   aspirin EC 81 MG tablet Take 1 tablet (81 mg total) by mouth daily.   losartan-hydrochlorothiazide (HYZAAR) 100-25 MG tablet TAKE 1 TABLET BY MOUTH EVERY DAY   potassium chloride SA (KLOR-CON M) 20 MEQ tablet Take 1 tablet (20 mEq total) by mouth daily.   simvastatin (ZOCOR) 20 MG tablet TAKE 1 TABLET BY MOUTH EVERYDAY AT BEDTIME    Allergies: Patient has No Known Allergies. Family History: Patient family history includes Brain cancer in her father; Breast cancer in her  paternal aunt; Breast cancer (age of onset: 31) in her cousin; Cancer in her maternal grandmother; Diabetes in her brother, mother, and sister; Hypertension in her mother and sister; Pancreatic cancer (age of onset: 24) in her cousin; Uterine cancer in her cousin and paternal aunt. Social History:  Patient  reports that she has never smoked. She has never used smokeless tobacco. She reports that she does not drink alcohol and does not use drugs.  Review of Systems: Constitutional: Negative for fever malaise or anorexia Cardiovascular: negative for chest pain Respiratory: negative for SOB or persistent cough Gastrointestinal: negative for abdominal pain  Objective  Vitals: BP 138/64   Pulse 79   Temp 98.3 F (36.8 C)   Ht '5\' 1"'$  (1.549 m)   Wt 155 lb 12.8 oz (70.7 kg)   SpO2 99%   BMI 29.44 kg/m  General: no acute distress , A&Ox3 HEENT: PEERL, conjunctiva normal, neck is supple Cardiovascular:  RRR without murmur or gallop.  Respiratory:  Good breath sounds bilaterally, CTAB with normal respiratory effort Abdomen:soft, no cvat, no suprapubic ttp Skin:  Warm, no rashes    Commons side effects, risks, benefits, and alternatives for medications and treatment plan prescribed today were discussed, and the patient expressed understanding of the given instructions. Patient is instructed to call or message via MyChart if he/she has any questions or concerns regarding our treatment plan. No barriers to understanding were identified. We discussed Red Flag symptoms and signs in detail. Patient expressed understanding regarding what to do in case of urgent or emergency type symptoms.  Medication list was reconciled, printed and provided to the patient in AVS. Patient instructions and summary information was reviewed with the patient as documented in the AVS. This note was prepared with assistance of Dragon voice recognition software. Occasional wrong-word or sound-a-like substitutions may have  occurred due to the inherent limitations of voice recognition software  This visit occurred during the SARS-CoV-2 public health emergency.  Safety protocols were in place, including screening questions prior to the visit, additional usage of staff PPE, and extensive cleaning of exam room while observing appropriate contact time as indicated for disinfecting solutions.

## 2022-04-03 LAB — URINE CULTURE
MICRO NUMBER:: 14388475
SPECIMEN QUALITY:: ADEQUATE

## 2022-04-04 MED ORDER — AMOXICILLIN-POT CLAVULANATE 875-125 MG PO TABS: 1.0000 | ORAL_TABLET | Freq: Two times a day (BID) | ORAL | 0 refills | Status: DC

## 2022-04-04 NOTE — Addendum Note (Signed)
Addended by: Billey Chang on: 04/04/2022 08:36 AM   Modules accepted: Orders

## 2022-04-04 NOTE — Progress Notes (Signed)
Please call patient: I have reviewed his/her lab results. Urine shows infection and needs antibiotics. I have ordered augmentin to take.

## 2022-06-10 ENCOUNTER — Encounter: Payer: Self-pay | Admitting: Family Medicine

## 2022-06-10 ENCOUNTER — Ambulatory Visit (INDEPENDENT_AMBULATORY_CARE_PROVIDER_SITE_OTHER): Payer: Medicare HMO | Admitting: Family Medicine

## 2022-06-10 VITALS — BP 138/84 | HR 114 | Temp 99.6°F | Ht 61.0 in | Wt 158.2 lb

## 2022-06-10 DIAGNOSIS — R5081 Fever presenting with conditions classified elsewhere: Secondary | ICD-10-CM

## 2022-06-10 DIAGNOSIS — Z87448 Personal history of other diseases of urinary system: Secondary | ICD-10-CM

## 2022-06-10 DIAGNOSIS — N390 Urinary tract infection, site not specified: Secondary | ICD-10-CM

## 2022-06-10 DIAGNOSIS — U071 COVID-19: Secondary | ICD-10-CM

## 2022-06-10 LAB — URINALYSIS, ROUTINE W REFLEX MICROSCOPIC
Bilirubin Urine: NEGATIVE
Nitrite: POSITIVE — AB
Specific Gravity, Urine: 1.015 (ref 1.000–1.030)
Urine Glucose: NEGATIVE
Urobilinogen, UA: 0.2 (ref 0.0–1.0)
pH: 8 (ref 5.0–8.0)

## 2022-06-10 LAB — POC COVID19 BINAXNOW: SARS Coronavirus 2 Ag: POSITIVE — AB

## 2022-06-10 MED ORDER — NIRMATRELVIR/RITONAVIR (PAXLOVID)TABLET: 3.0000 | ORAL_TABLET | Freq: Two times a day (BID) | ORAL | 0 refills | Status: AC

## 2022-06-10 NOTE — Patient Instructions (Addendum)
Please follow up as scheduled for your next visit with me: 08/10/2022   If you have any questions or concerns, please don't hesitate to send me a message via MyChart or call the office at [phone]. Thank you for visiting with [place] today! It's our pleasure caring for you.   Please do not take simvastatin for the next 10 days.  Take the paxlovid as directed to treat the covid infection.  Take tylenol or advil for your fevers and chills. I will order antibiotics if your urine shows infection.   You may call the urologist to make an appointment due to your recurring Urinary tract infections [facility]  Address: [address] [place] [address] Phone: [phone]

## 2022-06-10 NOTE — Progress Notes (Signed)
Subjective   CC:  Chief Complaint  Patient presents with   Fever    Pt stated that she has been having Fever, Chils ear pain since 06/08/2022 and not getting any better    HPI: Debra Simon is a 71 y.o. female who presents to the office today to address the problems listed above in the chief complaint. Patient complains of 48-hour history of fevers to 100.9, chills, malaise, fatigue, mild sore throat, minimal cough, mild headache.  She denies shortness of breath, nausea or vomiting or diarrhea.  However her appetite is down.  She also has noted dysuria that started yesterday.  Comorbidities include prediabetes, history of recurrent UTI most recently in January, E. coli treated with Augmentin.  She takes Arimidex for breast cancer and is also on a statin for hyperlipidemia.  She denies chest pain  Assessment  1. COVID   2. Fever in other diseases   3. Recurrent UTI (urinary tract infection)   4. History of pyelonephritis      Plan  COVID infection: Respiratory status is stable.  However she feels unwell.  Discussed supportive treatments.  Treat fevers.  Hydrate.  Rest.  Monitor respiratory status.  Start Paxlovid.  Hold simvastatin for 10 days.  Treat supportively with antihistamines, decongestants, and/or cough meds. See AVS for care instructions.  Possible recurrent UTI: Check urine and culture.  She has been referred to urology for recurrent UTIs however she did not make the appointment.  Phone number was given to her to call.  [facility].  She does not have CVA tenderness currently.  Will monitor for infection and start antibiotics if positive  Follow up: Follow-up as scheduled  Orders Placed This Encounter  Procedures   Urine Culture   Urinalysis, Routine w reflex microscopic   POC COVID-19   Meds ordered this encounter  Medications   nirmatrelvir/ritonavir (PAXLOVID) 20 x 150 MG & 10 x 100MG  TABS    Sig: Take 3 tablets by mouth 2 (two) times daily for 5 days. (Take  nirmatrelvir 150 mg two tablets twice daily for 5 days and ritonavir 100 mg one tablet twice daily for 5 days) Patient GFR is 92    Dispense:  30 tablet    Refill:  0      I reviewed the patients updated PMH, FH, and SocHx.    Patient Active Problem List   Diagnosis Date Noted   Prediabetes 11/11/2020    Priority: High   Malignant neoplasm of lower-outer quadrant of left breast of female, estrogen receptor positive ([place]) 09/07/2020    Priority: High   Serrated adenoma of colon 09/27/2019    Priority: High   Mixed hyperlipidemia 06/02/2016    Priority: High   Essential hypertension 07/31/2013    Priority: High   Blindness right eye category 3, blindness left eye category 3 09/09/2020    Priority: Medium    Umbilical hernia without obstruction and without gangrene 07/08/2020    Priority: Medium    History of lacunar cerebrovascular accident (CVA) 09/27/2019    Priority: Medium    Bilateral primary osteoarthritis of knee 06/05/2017    Priority: Medium    History of vaginal dysplasia 08/31/2016    Priority: Medium    Systolic murmur [id number]    Priority: Low   Varicose veins of both lower extremities with pain 07/08/2020    Priority: Low   Diverticulosis     Priority: Low   Nonrheumatic mitral valve regurgitation 07/04/2016    Priority: Low  Sensorineural hearing loss (SNHL), bilateral 07/07/2015    Priority: Low   Rectocele 05/30/2014    Priority: Low   Genetic testing 09/16/2020   Family history of breast cancer 09/10/2020   Family history of brain cancer 09/10/2020   Family history of uterine cancer 09/10/2020   Family history of pancreatic cancer 09/10/2020   Current Meds  Medication Sig   anastrozole (ARIMIDEX) 1 MG tablet Take 1 tablet (1 mg total) by mouth daily. Start December 26, 2020   aspirin EC 81 MG tablet Take 1 tablet (81 mg total) by mouth daily.   losartan-hydrochlorothiazide (HYZAAR) 100-25 MG tablet TAKE 1 TABLET BY MOUTH EVERY DAY    nirmatrelvir/ritonavir (PAXLOVID) 20 x 150 MG & 10 x 100MG  TABS Take 3 tablets by mouth 2 (two) times daily for 5 days. (Take nirmatrelvir 150 mg two tablets twice daily for 5 days and ritonavir 100 mg one tablet twice daily for 5 days) Patient GFR is 92   potassium chloride SA (KLOR-CON M) 20 MEQ tablet Take 1 tablet (20 mEq total) by mouth daily.   simvastatin (ZOCOR) 20 MG tablet TAKE 1 TABLET BY MOUTH EVERYDAY AT BEDTIME    Review of Systems: Constitutional: Negative for fever malaise or anorexia Cardiovascular: negative for chest pain Respiratory: negative for SOB or pleuritic chest pain Gastrointestinal: negative for abdominal pain  Objective  Vitals: BP 138/84   Pulse (!) 114   Temp 99.6 F (37.6 C)   Ht 5\' 1"  (1.549 m)   Wt 158 lb 3.2 oz (71.8 kg)   SpO2 95%   BMI 29.89 kg/m  General: no acute respiratory distress, nontoxic appearing but appears like she does not feel well Psych:  Alert and oriented, normal mood and affect HEENT: Normocephalic, nasal congestion present, TMs w/o erythema,supple neck  Cardiovascular:  RRR without murmur or gallop. no peripheral edema Respiratory:  Good breath sounds bilaterally, CTAB with normal respiratory effort, no CVA tenderness Abdomen is soft, nontender Skin:  Warm, no rashes  Office Visit on 06/10/2022  Component Date Value Ref Range Status   SARS Coronavirus 2 Ag 06/10/2022 Positive (A)  Negative Final    Commons side effects, risks, benefits, and alternatives for medications and treatment plan prescribed today were discussed, and the patient expressed understanding of the given instructions. Patient is instructed to call or message via MyChart if he/she has any questions or concerns regarding our treatment plan. No barriers to understanding were identified. We discussed Red Flag symptoms and signs in detail. Patient expressed understanding regarding what to do in case of urgent or emergency type symptoms.  Medication list was  reconciled, printed and provided to the patient in AVS. Patient instructions and summary information was reviewed with the patient as documented in the AVS. This note was prepared with assistance of Dragon voice recognition software. Occasional wrong-word or sound-a-like substitutions may have occurred due to the inherent limitations of voice recognition software

## 2022-06-12 LAB — URINE CULTURE
MICRO NUMBER:: 14698823
SPECIMEN QUALITY:: ADEQUATE

## 2022-06-13 MED ORDER — AMOXICILLIN 875 MG PO TABS: 875.0000 mg | ORAL_TABLET | Freq: Two times a day (BID) | ORAL | 0 refills | Status: AC

## 2022-06-13 NOTE — Progress Notes (Signed)
Please call patient: I have reviewed his/her lab results. Urine is infected again and I have ordered amoxicillin for her to take.

## 2022-06-13 NOTE — Addendum Note (Signed)
Addended by: Billey Chang on: 06/13/2022 09:42 AM   Modules accepted: Orders

## 2022-06-20 ENCOUNTER — Ambulatory Visit: Payer: Medicare HMO

## 2022-06-29 ENCOUNTER — Ambulatory Visit: Payer: Medicare HMO | Admitting: Obstetrics & Gynecology

## 2022-07-11 ENCOUNTER — Telehealth: Payer: Self-pay | Admitting: Family Medicine

## 2022-07-11 NOTE — Telephone Encounter (Signed)
Called patient to schedule Medicare Annual Wellness Visit (AWV). Left message for patient to call back and schedule Medicare Annual Wellness Visit (AWV).  Last date of AWV: 12/11/2020.  Please schedule an appointment at any time with Inetta Fermo, [facility]. Please schedule AWVS with Inetta Fermo, [facility].  If any questions, please contact me at [phone].  Thank you ,  Gabriel Cirri [facility] AWV TEAM Direct Dial [phone]

## 2022-07-13 ENCOUNTER — Ambulatory Visit: Payer: Medicare HMO | Attending: General Surgery | Admitting: Rehabilitation

## 2022-07-13 DIAGNOSIS — Z170 Estrogen receptor positive status [ER+]: Secondary | ICD-10-CM | POA: Insufficient documentation

## 2022-07-13 DIAGNOSIS — Z483 Aftercare following surgery for neoplasm: Secondary | ICD-10-CM | POA: Insufficient documentation

## 2022-07-13 DIAGNOSIS — C50512 Malignant neoplasm of lower-outer quadrant of left female breast: Secondary | ICD-10-CM | POA: Insufficient documentation

## 2022-07-13 NOTE — Therapy (Signed)
OUTPATIENT PHYSICAL THERAPY SOZO SCREENING NOTE   Patient Name: Debra Simon MRN: [id number] DOB:[birth year], 72 y.o., female Today's Date: 07/13/2022  PCP: Willow Ora, MD REFERRING PROVIDER: Almond Lint, MD   PT End of Session - 07/13/22 1208     Visit Number 1   screen only   PT Start Time 1200    PT Stop Time 1208    PT Time Calculation (min) 8 min    Activity Tolerance Patient tolerated treatment well    Behavior During Therapy [facility] for tasks assessed/performed             Past Medical History:  Diagnosis Date   Anxiety    Arthritis    Breast cancer (HCC)    left breast 2022   Colon polyps 2010   Diastolic dysfunction, left ventricle 06/26/2016   (ECHO 2018): Left ventricle: The cavity size was normal. Systolic function was normal. The estimated ejection fraction was in the range of 60% to 65%. Wall motion was normal; there were no regional wall motion abnormalities. Doppler parameters are consistent with abnormal left ventricular relaxation (grade 1 diastolic dysfunction).   Diverticulosis    Family history of brain cancer    Family history of breast cancer    Family history of pancreatic cancer    Family history of uterine cancer    GERD (gastroesophageal reflux disease)    watches what she eats   Headache    gets headaches when she eats sweets   Heart murmur    dx last yr.   had echo 05/2016   History of radiation therapy    Left breast- 11/18/20-12/17/20- Dr. Antony Blackbird   Hyperlipidemia    Hypertension    Mild mitral regurgitation 07/04/2016   Personal history of radiation therapy    Pneumonia    Pre-diabetes    Prediabetes 11/11/2020   Primary localized osteoarthritis of right knee 05/24/2017   Ureterovaginal fistula    Uterine fibroid 1994   Vaginal dysplasia    Past Surgical History:  Procedure Laterality Date   BREAST CYST ASPIRATION Right    BREAST EXCISIONAL BIOPSY Right    BREAST LUMPECTOMY     BREAST LUMPECTOMY WITH RADIOACTIVE  SEED AND SENTINEL LYMPH NODE BIOPSY Left 10/15/2020   Procedure: LEFT BREAST LUMPECTOMY WITH RADIOACTIVE SEED AND SENTINEL LYMPH NODE BIOPSY;  Surgeon: Almond Lint, MD;  Location: [facility] OR;  Service: General;  Laterality: Left;   COLONOSCOPY  2010   COLONOSCOPY  2012   COLONOSCOPY  2016   CYSTOSCOPY     ESOPHAGOGASTRODUODENOSCOPY  2010   TOTAL ABDOMINAL HYSTERECTOMY  1994   TOTAL KNEE ARTHROPLASTY Right 06/05/2017   TOTAL KNEE ARTHROPLASTY Right 06/05/2017   Procedure: RIGHT TOTAL KNEE ARTHROPLASTY;  Surgeon: Salvatore Marvel, MD;  Location: [facility] OR;  Service: Orthopedics;  Laterality: Right;   Ueterovaginal fistual repair     URETERAL EXPLORATION  04/16/1992   URETEROTOMY ON THE RIGHT, REPAIR LACERATION ON THE LEFT   Patient Active Problem List   Diagnosis Date Noted   Systolic murmur 08/06/2021   Prediabetes 11/11/2020   Genetic testing 09/16/2020   Family history of breast cancer 09/10/2020   Family history of brain cancer 09/10/2020   Family history of uterine cancer 09/10/2020   Family history of pancreatic cancer 09/10/2020   Blindness right eye category 3, blindness left eye category 3 09/09/2020   Malignant neoplasm of lower-outer quadrant of left breast of female, estrogen receptor positive 09/07/2020   Varicose veins of both lower  extremities with pain 07/08/2020   Umbilical hernia without obstruction and without gangrene 07/08/2020   History of lacunar cerebrovascular accident (CVA) 09/27/2019   Serrated adenoma of colon 09/27/2019   Bilateral primary osteoarthritis of knee 06/05/2017   Diverticulosis    History of vaginal dysplasia 08/31/2016   Nonrheumatic mitral valve regurgitation 07/04/2016   Mixed hyperlipidemia 06/02/2016   Sensorineural hearing loss (SNHL), bilateral 07/07/2015   Rectocele 05/30/2014   Essential hypertension 07/31/2013    REFERRING DIAG: left breast cancer at risk for lymphedema  THERAPY DIAG: Aftercare following surgery for  neoplasm  Malignant neoplasm of lower-outer quadrant of left breast of female, estrogen receptor positive  PERTINENT HISTORY: Patient was diagnosed on 08/28/2020 with left grade I-II invasive ductal carcinoma breast cancer. She had a left lumpectomy and sentinel node biopsy on 10/15/2020 with 5 negative axillary nodes removed. It is ER/PR positive and HER2 negative with a Ki67 of 15%.   PRECAUTIONS: left UE Lymphedema risk, None  SUBJECTIVE: Pt returns for her 3 month L-Dex screen.   PAIN:  Are you having pain? No  SOZO SCREENING: Patient was assessed today using the SOZO machine to determine the lymphedema index score. This was compared to her baseline score. It was determined that she is within the recommended range when compared to her baseline and no further action is needed at this time. She will continue SOZO screenings. These are done every 3 months for 2 years post operatively followed by every 6 months for 2 years, and then annually.      Idamae Lusher, PT 07/13/2022, 12:09 PM

## 2022-07-20 ENCOUNTER — Encounter: Payer: Self-pay | Admitting: Obstetrics & Gynecology

## 2022-07-20 ENCOUNTER — Ambulatory Visit (INDEPENDENT_AMBULATORY_CARE_PROVIDER_SITE_OTHER): Payer: Medicare HMO | Admitting: Obstetrics & Gynecology

## 2022-07-20 VITALS — BP 116/74 | HR 78 | Ht 59.75 in | Wt 150.0 lb

## 2022-07-20 DIAGNOSIS — Z9189 Other specified personal risk factors, not elsewhere classified: Secondary | ICD-10-CM | POA: Diagnosis not present

## 2022-07-20 DIAGNOSIS — Z90710 Acquired absence of both cervix and uterus: Secondary | ICD-10-CM | POA: Diagnosis not present

## 2022-07-20 DIAGNOSIS — Z853 Personal history of malignant neoplasm of breast: Secondary | ICD-10-CM

## 2022-07-20 DIAGNOSIS — Z780 Asymptomatic menopausal state: Secondary | ICD-10-CM

## 2022-07-20 DIAGNOSIS — Z01419 Encounter for gynecological examination (general) (routine) without abnormal findings: Secondary | ICD-10-CM

## 2022-07-20 NOTE — Progress Notes (Signed)
Debra Simon December 11, 1950 [id number]   History:    72 y.o.  [id number] Married   RP:  Established patient presenting for annual gyn exam    HPI:  S/P Total Abdominal Hysterectomy in 1994.  Patient uncertain as to whether her Ovaries were preserved or not.  Pelvic US 06/07/2013 Ovaries not seen.  Patient Postmenopausal, well on no hormone replacement therapy.  No pelvic pain.  Abstinent.  Pap 05/2020 Neg.  Urine and bowel movements normal. Left Breast Ca Dx in 07/2020.  On Anastrozole.  Mammo Benign 08/17/21. Body mass index 29.54.  Walking and working in the yard. Health labs with family physician. Colono 01/2020.  BD Normal 08/17/21. COLONOSCOPY: 02-14-20.   Past medical history,surgical history, family history and social history were all reviewed and documented in the EPIC chart.  Gynecologic History No LMP recorded. Patient has had a hysterectomy.  Obstetric History OB History  Gravida Para Term Preterm AB Living  SAB IAB Ectopic Multiple Live Births  3            # Outcome Date GA Lbr Len/2nd Weight Sex Delivery Anes PTL Lv  7 SAB           6 SAB           5 SAB           4 Para           3 Para           2 Para           1 Para              ROS: A ROS was performed and pertinent positives and negatives are included in the history. GENERAL: No fevers or chills. HEENT: No change in vision, no earache, sore throat or sinus congestion. NECK: No pain or stiffness. CARDIOVASCULAR: No chest pain or pressure. No palpitations. PULMONARY: No shortness of breath, cough or wheeze. GASTROINTESTINAL: No abdominal pain, nausea, vomiting or diarrhea, melena or bright red blood per rectum. GENITOURINARY: No urinary frequency, urgency, hesitancy or dysuria. MUSCULOSKELETAL: No joint or muscle pain, no back pain, no recent trauma. DERMATOLOGIC: No rash, no itching, no lesions. ENDOCRINE: No polyuria, polydipsia, no heat or cold intolerance. No recent change in weight. HEMATOLOGICAL: No  anemia or easy bruising or bleeding. NEUROLOGIC: No headache, seizures, numbness, tingling or weakness. PSYCHIATRIC: No depression, no loss of interest in normal activity or change in sleep pattern.     Exam:   BP 116/74   Pulse 78   Ht 4' 11.75" (1.518 m)   Wt 150 lb (68 kg)   SpO2 98%   BMI 29.54 kg/m   Body mass index is 29.54 kg/m.  General appearance : Well developed well nourished female. No acute distress HEENT: Eyes: no retinal hemorrhage or exudates,  Neck supple, trachea midline, no carotid bruits, no thyroidmegaly Lungs: Clear to auscultation, no rhonchi or wheezes, or rib retractions  Heart: Regular rate and rhythm, no murmurs or gallops Breast:Examined in sitting and supine position were symmetrical in appearance, no palpable masses or tenderness,  no skin retraction, no nipple inversion, no nipple discharge, no skin discoloration, no axillary or supraclavicular lymphadenopathy Abdomen: no palpable masses or tenderness, no rebound or guarding Extremities: no edema or skin discoloration or tenderness  Pelvic: Vulva: Normal             Vagina: No gross lesions or discharge  Cervix/Uterus absent  Adnexa  Without masses or tenderness  Anus: Normal   Assessment/Plan:  72 y.o. female for annual exam   1. Well female exam with routine gynecological exam S/P Total Abdominal Hysterectomy in 1994.  Patient uncertain as to whether her Ovaries were preserved or not.  Pelvic US 06/07/2013 Ovaries not seen.  Patient Postmenopausal, well on no hormone replacement therapy.  No pelvic pain.  Abstinent.  Pap 05/2020 Neg.  Urine and bowel movements normal. Left Breast Ca Dx in 07/2020.  On Anastrozole.  Mammo Benign 08/17/21. Body mass index 29.54.  Walking and working in the yard. Health labs with family physician. Colono 01/2020.  BD Normal 08/17/21. COLONOSCOPY: 02-14-20.  2. S/P total hysterectomy  3. Postmenopausal Patient Postmenopausal, well on no hormone replacement therapy.  No  pelvic pain.  Abstinent.   4. Personal history of breast cancer   Genia Del MD, 11:35 AM

## 2022-07-22 DIAGNOSIS — N3020 Other chronic cystitis without hematuria: Secondary | ICD-10-CM | POA: Diagnosis not present

## 2022-08-09 ENCOUNTER — Other Ambulatory Visit: Payer: Self-pay | Admitting: Family Medicine

## 2022-08-10 ENCOUNTER — Encounter: Payer: Self-pay | Admitting: Family Medicine

## 2022-08-10 ENCOUNTER — Ambulatory Visit (INDEPENDENT_AMBULATORY_CARE_PROVIDER_SITE_OTHER): Payer: Medicare HMO | Admitting: Family Medicine

## 2022-08-10 VITALS — BP 130/76 | HR 74 | Temp 98.0°F | Ht 59.75 in | Wt 149.6 lb

## 2022-08-10 DIAGNOSIS — I340 Nonrheumatic mitral (valve) insufficiency: Secondary | ICD-10-CM

## 2022-08-10 DIAGNOSIS — E782 Mixed hyperlipidemia: Secondary | ICD-10-CM

## 2022-08-10 DIAGNOSIS — D126 Benign neoplasm of colon, unspecified: Secondary | ICD-10-CM

## 2022-08-10 DIAGNOSIS — Z8673 Personal history of transient ischemic attack (TIA), and cerebral infarction without residual deficits: Secondary | ICD-10-CM

## 2022-08-10 DIAGNOSIS — Z0000 Encounter for general adult medical examination without abnormal findings: Secondary | ICD-10-CM

## 2022-08-10 DIAGNOSIS — C50512 Malignant neoplasm of lower-outer quadrant of left female breast: Secondary | ICD-10-CM | POA: Diagnosis not present

## 2022-08-10 DIAGNOSIS — Z170 Estrogen receptor positive status [ER+]: Secondary | ICD-10-CM

## 2022-08-10 DIAGNOSIS — R7303 Prediabetes: Secondary | ICD-10-CM

## 2022-08-10 DIAGNOSIS — N390 Urinary tract infection, site not specified: Secondary | ICD-10-CM

## 2022-08-10 DIAGNOSIS — I10 Essential (primary) hypertension: Secondary | ICD-10-CM

## 2022-08-10 LAB — HEMOGLOBIN A1C: Hgb A1c MFr Bld: 5.9 % (ref 4.6–6.5)

## 2022-08-10 LAB — LIPID PANEL
Cholesterol: 130 mg/dL (ref 0–200)
HDL: 51.5 mg/dL (ref >39.00)
LDL Cholesterol: 61 mg/dL (ref 0–99)
NonHDL: 78.19
Total CHOL/HDL Ratio: 3
Triglycerides: 88 mg/dL (ref 0.0–149.0)
VLDL: 17.6 mg/dL (ref 0.0–40.0)

## 2022-08-10 LAB — COMPREHENSIVE METABOLIC PANEL
ALT: 12 U/L (ref 0–35)
AST: 18 U/L (ref 0–37)
Albumin: 4.5 g/dL (ref 3.5–5.2)
Alkaline Phosphatase: 71 U/L (ref 39–117)
BUN: 12 mg/dL (ref 6–23)
CO2: 28 mEq/L (ref 19–32)
Calcium: 9.7 mg/dL (ref 8.4–10.5)
Chloride: 98 mEq/L (ref 96–112)
Creatinine, Ser: 0.6 mg/dL (ref 0.40–1.20)
GFR: 90.73 mL/min (ref >60.00)
Glucose, Bld: 108 mg/dL — ABNORMAL HIGH (ref 70–99)
Potassium: 3.4 mEq/L — ABNORMAL LOW (ref 3.5–5.1)
Sodium: 135 mEq/L (ref 135–145)
Total Bilirubin: 0.7 mg/dL (ref 0.2–1.2)
Total Protein: 7.2 g/dL (ref 6.0–8.3)

## 2022-08-10 LAB — CBC WITH DIFFERENTIAL/PLATELET
Basophils Absolute: 0 10*3/uL (ref 0.0–0.1)
Basophils Relative: 0.2 % (ref 0.0–3.0)
Eosinophils Absolute: 0.1 10*3/uL (ref 0.0–0.7)
Eosinophils Relative: 2.2 % (ref 0.0–5.0)
HCT: 37.3 % (ref 36.0–46.0)
Hemoglobin: 12.6 g/dL (ref 12.0–15.0)
Lymphocytes Relative: 31.7 % (ref 12.0–46.0)
Lymphs Abs: 1.3 10*3/uL (ref 0.7–4.0)
MCHC: 33.9 g/dL (ref 30.0–36.0)
MCV: 88.5 fl (ref 78.0–100.0)
Monocytes Absolute: 0.4 10*3/uL (ref 0.1–1.0)
Monocytes Relative: 8.6 % (ref 3.0–12.0)
Neutro Abs: 2.4 10*3/uL (ref 1.4–7.7)
Neutrophils Relative %: 57.3 % (ref 43.0–77.0)
Platelets: 226 10*3/uL (ref 150.0–400.0)
RBC: 4.21 Mil/uL (ref 3.87–5.11)
RDW: 13.8 % (ref 11.5–15.5)
WBC: 4.1 10*3/uL (ref 4.0–10.5)

## 2022-08-10 LAB — TSH: TSH: 2.73 u[IU]/mL (ref 0.35–5.50)

## 2022-08-10 NOTE — Patient Instructions (Signed)
Please return in 6 months for hypertension follow up.  ? ?I will release your lab results to you on your MyChart account with further instructions. You may see the results before I do, but when I review them I will send you a message with my report or have my assistant call you if things need to be discussed. Please reply to my message with any questions. Thank you!  ? ?If you have any questions or concerns, please don't hesitate to send me a message via MyChart or call the office at [phone]. Thank you for visiting with us today! It's our pleasure caring for you.  ? ?Cuidados preventivos en las mujeres a partir de los 65 a?os de edad ?Preventive Care 65 Years and Older, Female ?Los cuidados preventivos hacen referencia a las opciones en cuanto al estilo de vida y a las visitas al m?dico, las cuales pueden promover la salud y el bienestar. Las visitas de cuidado preventivo tambi?n se denominan ex?menes de bienestar. ??Qu? puedo esperar para mi visita de cuidado preventivo? ?Asesoramiento ?Su m?dico puede preguntarle acerca de: ?Antecedentes m?dicos, incluidos los siguientes: ?Problemas m?dicos pasados. ?Antecedentes m?dicos familiares. ?Embarazo y antecedentes menstruales. ?Sus antecedentes de ca?das. ?Salud actual, incluido lo siguiente: ?Memoria y capacidad de comprensi?n (capacidad intelectual). ?Su bienestar emocional. ?Bienestar en el hogar y las relaciones personales. ?Actividad sexual y salud sexual. ?Estilo de vida, incluido lo siguiente: ?Consumo de alcohol, nicotina, tabaco o drogas. ?Acceso a armas de fuego. ?H?bitos de alimentaci?n, ejercicio y sue?o. ?Su trabajo y ambiente laboral. ?Uso de pantalla solar. ?Cuestiones de seguridad, como el uso de cintur?n de seguridad y casco de bicicleta. ?Examen f?sico ?El m?dico revisar? lo siguiente: ?Estatura y peso. Estos pueden usarse para calcular el IMC (?ndice de masa corporal). El IMC es una medici?n que indica si tiene un peso saludable. ?Circunferencia  de la cintura. Es una medici?n alrededor de la cintura. Esta medici?n tambi?n indica si tiene un peso saludable y puede ayudar a predecir su riesgo de padecer ciertas enfermedades, como diabetes tipo 2 y presi?n arterial alta. ?Frecuencia card?aca y presi?n arterial. ?Temperatura corporal. ?Piel para detectar manchas anormales. ??Qu? vacunas necesito? ? ?Las vacunas se aplican a varias edades, seg?n un cronograma. El m?dico le recomendar? vacunas seg?n su edad, sus antecedentes m?dicos, su estilo de vida y otros factores, como los viajes o el lugar donde trabaja. ??Qu? pruebas necesito? ?Pruebas de detecci?n ?El m?dico puede recomendar pruebas de detecci?n de ciertas afecciones. Esto puede incluir: ?Niveles de l?pidos y colesterol. ?Prueba de hepatitis C. ?Prueba de hepatitis B. ?Prueba del VIH (virus de inmunodeficiencia humana). ?Pruebas de infecciones de transmisi?n sexual (ITS), si est? en riesgo. ?Pruebas de detecci?n de c?ncer de pulm?n. ?Pruebas de detecci?n de c?ncer colorrectal. ?Pruebas de detecci?n de la diabetes. Esto se realiza mediante un control del az?car en la sangre (glucosa) despu?s de no haber comido durante un periodo de tiempo (ayuno). ?Mamograf?a. Hable con el m?dico sobre la frecuencia con la que debe realizarse mamograf?as. ?Pruebas de detecci?n de c?ncer relacionado con las mutaciones del BRCA. Es posible que se las deba realizar si tiene antecedentes de c?ncer de mama, de ovario, de trompas o peritoneal. ?Densitometr?a ?sea. Esto se realiza para detectar osteoporosis. ?Hable con su m?dico sobre los resultados de las pruebas, las opciones de tratamiento y, si corresponde, la necesidad de realizar m?s pruebas. ?Siga estas instrucciones en su casa: ?Comida y bebida ? ?Siga una dieta que incluya frutas y verduras frescas, cereales integrales, prote?nas magras y productos l?cteos descremados.   Limite el consumo de alimentos con alto contenido de az?car, grasas saturadas y sal. ?Tome los  suplementos vitam?nicos y minerales como se lo haya indicado el m?dico. ?No beba alcohol si el m?dico se lo proh?be. ?Si bebe alcohol: ?Limite la cantidad que consume de 0 a 1 medida por d?a. ?Sepa cu?nta cantidad de alcohol hay en las bebidas que toma. En [place], una medida equivale a una botella de cerveza de 12 oz (355 ml), un vaso de vino de 5 oz (148 ml) o un vaso de una bebida alcoh?lica de alta graduaci?n de 1? oz (44 ml). ?Estilo de vida ?Cep?llese los dientes a la ma?ana y a la noche con pasta dental con fluoruro. Use hilo dental una vez al d?a. ?Haga al menos 30 minutos de ejercicio, 5 o m?s d?as cada semana. ?No consuma ning?n producto que contenga nicotina o tabaco. Estos productos incluyen cigarrillos, tabaco para mascar y aparatos de vapeo, como los cigarrillos electr?nicos. Si necesita ayuda para dejar de fumar, consulte al m?dico. ?No consuma drogas. ?Si es sexualmente activa, practique sexo seguro. Use un cond?n u otra forma de protecci?n a fin de prevenir las infecciones de transmisi?n sexual (ITS). ?Tome aspirina ?nicamente como se lo haya indicado el m?dico. Aseg?rese de que comprende qu? cantidad y cu?l presentaci?n debe tomar. Trabaje con el m?dico para averiguar si es seguro y beneficioso para usted tomar aspirina a diario. ?Preg?ntele al m?dico si necesita tomar un medicamento para reducir el colesterol (estatina). ?Busque maneras saludables de controlar el estr?s, tales como: ?Meditaci?n, yoga o escuchar m?sica. ?Lleve un diario personal. ?Hable con una persona confiable. ?Pase tiempo con amigos y familiares. ?Minimice la exposici?n a la radiaci?n UV para reducir el riesgo de c?ncer de piel. ?Seguridad ?Usa siempre el cintur?n de seguridad al conducir o viajar en un veh?culo. ?No conduzca: ?Si ha estado bebiendo alcohol. No viaje con un conductor que ha estado bebiendo. ?Si est? cansada o distra?da. ?Mientras est? enviando mensajes de texto. ?Si ha estado usando sustancias o  drogas que alteran la funci?n mental. ?Use un casco y otros equipos de protecci?n durante las actividades deportivas. ?Si tiene armas de fuego en su casa, aseg?rese de seguir todos los procedimientos de seguridad correspondientes. ??Cu?ndo volver? ?Visite al m?dico una vez al a?o para una visita anual de control de bienestar. ?Preg?ntele al m?dico con qu? frecuencia debe realizarse un control de la vista y los dientes. ?Mantenga su esquema de vacunaci?n al d?a. ?Esta informaci?n no tiene como fin reemplazar el consejo del m?dico. Aseg?rese de hacerle al m?dico cualquier pregunta que tenga. ?Document Revised: 09/30/2020 Document Reviewed: 09/30/2020 ?Elsevier Patient Education ? 2023 [employer] ? ?

## 2022-08-10 NOTE — Progress Notes (Signed)
Subjective  Chief Complaint  Patient presents with   Annual Exam    Pt here for Annual Exam and is not currently fasting     HPI: Debra Simon is a 72 y.o. female who presents to [facility] today for a Female Wellness Visit. She also has the concerns and/or needs as listed above in the chief complaint. These will be addressed in addition to the Health Maintenance Visit.   Wellness Visit: annual visit with health maintenance review and exam without Pap  Health maintenance: Reviewed GYN female wellness visit from last month.  Status post hysterectomy plus minus oophorectomy.  Mammogram scheduled for later this month.  History of breast cancer on treatment, Arimidex.  Reviewed oncology notes.  Stable.  History of adenomatous polyps for every 5 year colonoscopy surveillance, last 2021.  No melena.  Feels well.  Active.  Likes to garden. Chronic disease f/u and/or acute problem visit: (deemed necessary to be done in addition to the wellness visit): Hypertension on losartan HCT and potassium supplementation.  Home blood pressures are consistently normal, 120 over 60s to 70s typically.  No chest pain or palpitations.  No lightheadedness.  Sometimes she admits that she feels tired and feels heaviness in her legs but no paresis.  No leg swelling.  Does have a painful varicosity on left thigh Hyperlipidemia nonfasting for recheck on statin. History of prediabetes without symptoms of hyperglycemia.  Diet is good. Recurrent UTIs: Had urological evaluation.  Reports was recommended to start cranberry pills and probiotics.  Fortunately has not had any recurrent infections since February.  She has follow-up in May to get an ultrasound at that time.  She feels well today  Assessment  1. Annual physical exam   2. Essential hypertension   3. Mixed hyperlipidemia   4. Serrated adenoma of colon   5. Malignant neoplasm of lower-outer quadrant of left breast of female, estrogen  receptor positive (HCC)   6. Prediabetes   7. Recurrent UTI (urinary tract infection)   8. History of lacunar cerebrovascular accident (CVA)   9. Nonrheumatic mitral valve regurgitation      Plan  Female Wellness Visit: Age appropriate Health Maintenance and Prevention measures were discussed with patient. Included topics are cancer screening recommendations, ways to keep healthy (see AVS) including dietary and exercise recommendations, regular eye and dental care, use of seat belts, and avoidance of moderate alcohol use and tobacco use.  Mammogram scheduled. BMI: discussed patient's BMI and encouraged positive lifestyle modifications to help get to or maintain a target BMI. HM needs and immunizations were addressed and ordered. See below for orders. See HM and immunization section for updates. Routine labs and screening tests ordered including cmp, cbc and lipids where appropriate. Discussed recommendations regarding Vit D and calcium supplementation (see AVS)  Chronic disease management visit and/or acute problem visit: Hypertension is well-controlled on Hyzaar 100/25 and potassium 20 mg once daily.  Recheck renal function electrolytes.  Continue home monitoring. History of CVA on long-term aspirin therapy.  Stable Breast cancer on Arimidex, status posttreatment.  Stable Hyperlipidemia on simvastatin 20 mg nightly, recheck nonfasting labs and LFTs today.  Well-tolerated. Recheck A1c.  Continue exercise and healthy diet  Outpatient Encounter Medications as of 08/10/2022  Medication Sig   anastrozole (ARIMIDEX) 1 MG tablet Take 1 tablet (1 mg total) by mouth daily. Start December 26, 2020   aspirin EC 81 MG tablet Take 1 tablet (81 mg total) by mouth daily.  losartan-hydrochlorothiazide (HYZAAR) 100-25 MG tablet TAKE 1 TABLET BY MOUTH EVERY DAY   potassium chloride SA (KLOR-CON M) 20 MEQ tablet Take 1 tablet (20 mEq total) by mouth daily.   simvastatin (ZOCOR) 20 MG tablet TAKE 1 TABLET BY  MOUTH EVERYDAY AT BEDTIME   No facility-administered encounter medications on file as of 08/10/2022.    Follow up: 6 months to recheck blood pressure Orders Placed This Encounter  Procedures   Urine Culture   CBC with Differential/Platelet   Comprehensive metabolic panel   Lipid panel   TSH   Hemoglobin A1c   No orders of the defined types were placed in this encounter.     Body mass index is 29.46 kg/m. Wt Readings from Last 3 Encounters:  08/10/22 149 lb 9.6 oz (67.9 kg)  07/20/22 150 lb (68 kg)  06/10/22 158 lb 3.2 oz (71.8 kg)     Patient Active Problem List   Diagnosis Date Noted   Prediabetes 11/11/2020    Priority: High   Malignant neoplasm of lower-outer quadrant of left breast of female, estrogen receptor positive (HCC) 09/07/2020    Priority: High    Mammogram due June 2023 at [facility]     Serrated adenoma of colon 09/27/2019    Priority: High    Colonoscopy 2016, performed [place], recommend 5-year follow-up Colonoscopy August 2021, serrated adenoma    Mixed hyperlipidemia 06/02/2016    Priority: High   Essential hypertension 07/31/2013    Priority: High   Blindness right eye category 3, blindness left eye category 3 09/09/2020    Priority: Medium    Umbilical hernia without obstruction and without gangrene 07/08/2020    Priority: Medium    History of lacunar cerebrovascular accident (CVA) 09/27/2019    Priority: Medium     MRI 06/23/15 when working up dizziness, on aspirin since    Bilateral primary osteoarthritis of knee 06/05/2017    Priority: Medium    History of vaginal dysplasia 08/31/2016    Priority: Medium    Systolic murmur 08/06/2021    Priority: Low    Noted 07/2021, mild MR on echocardiogram 2018. Nl Aortic valve. Nl EF    Varicose veins of both lower extremities with pain 07/08/2020    Priority: Low   Diverticulosis     Priority: Low   Nonrheumatic mitral valve regurgitation 07/04/2016    Priority: Low    Noted 07/2021, mild MR on  echocardiogram 2018. Nl Aortic valve. Nl EF    Sensorineural hearing loss (SNHL), bilateral 07/07/2015    Priority: Low   Rectocele 05/30/2014    Priority: Low   Genetic testing 09/16/2020    Negative genetic testing:  No pathogenic variants detected on the [employer] BRCAplus panel (report date 09/15/2020) or the CancerNext-Expanded + RNAinsight panel (report date 09/19/2020).    The BRCAplus panel offered by [employer] and includes sequencing and deletion/duplication analysis for the following 8 genes: ATM, BRCA1, BRCA2, CDH1, CHEK2, PALB2, PTEN, and TP53. The CancerNext-Expanded + RNAinsight gene panel offered by [employer] and includes sequencing and rearrangement analysis for the following 77 genes: AIP, ALK, APC, ATM, AXIN2, BAP1, BARD1, BLM, BMPR1A, BRCA1, BRCA2, BRIP1, CDC73, CDH1, CDK4, CDKN1B, CDKN2A, CHEK2, CTNNA1, DICER1, FANCC, FH, FLCN, GALNT12, KIF1B, LZTR1, MAX, MEN1, MET, MLH1, MSH2, MSH3, MSH6, MUTYH, NBN, NF1, NF2, NTHL1, PALB2, PHOX2B, PMS2, POT1, PRKAR1A, PTCH1, PTEN, RAD51C, RAD51D, RB1, RECQL, RET, SDHA, SDHAF2, SDHB, SDHC, SDHD, SMAD4, SMARCA4, SMARCB1, SMARCE1, STK11, SUFU, TMEM127, TP53, TSC1, TSC2, VHL and XRCC2 (sequencing  and deletion/duplication); EGFR, EGLN1, HOXB13, KIT, MITF, PDGFRA, POLD1 and POLE (sequencing only); EPCAM and GREM1 (deletion/duplication only). RNA data is routinely analyzed for use in variant interpretation for all genes.    Family history of breast cancer 09/10/2020   Family history of brain cancer 09/10/2020   Family history of uterine cancer 09/10/2020   Family history of pancreatic cancer 09/10/2020   Health Maintenance  Topic Date Due   Medicare Annual Wellness (AWV)  12/11/2021   COVID-19 Vaccine (4 - 2023-24 season) 08/26/2022 (Originally 11/26/2021)   MAMMOGRAM  08/18/2022   INFLUENZA VACCINE  10/27/2022   COLONOSCOPY (Pts 45-65yrs Insurance coverage will need to be confirmed)  02/13/2025   DEXA SCAN  08/18/2026   DTaP/Tdap/Td (2 - Td  or Tdap) 01/23/2028   Pneumonia Vaccine 41+ Years old  Completed   Hepatitis C Screening  Completed   Zoster Vaccines- Shingrix  Completed   HPV VACCINES  Aged Out   Immunization History  Administered Date(s) Administered   Fluad Quad(high Dose 65+) 01/08/2020, 01/04/2021, 02/10/2022   Influenza, High Dose Seasonal PF 12/26/2017   Influenza,inj,Quad PF,6+ Mos 01/09/2019   Influenza-Unspecified 01/04/2021   Moderna Sars-Covid-2 Vaccination 07/26/2019, 08/23/2019   PFIZER(Purple Top)SARS-COV-2 Vaccination 03/05/2020   Pneumococcal Conjugate-13 12/26/2017   Pneumococcal Polysaccharide-23 09/27/2019   Tdap 01/22/2018   Zoster Recombinat (Shingrix) 07/22/2016, 11/18/2016   We updated and reviewed the patient's past history in detail and it is documented below. Allergies: Patient has No Known Allergies. Past Medical History Patient  has a past medical history of Anxiety, Arthritis, Breast cancer (HCC), Colon polyps (2010), Diastolic dysfunction, left ventricle (06/26/2016), Diverticulosis, Family history of brain cancer, Family history of breast cancer, Family history of pancreatic cancer, Family history of uterine cancer, GERD (gastroesophageal reflux disease), Headache, Heart murmur, History of radiation therapy, Hyperlipidemia, Hypertension, Mild mitral regurgitation (07/04/2016), Personal history of radiation therapy, Pneumonia, Pre-diabetes, Prediabetes (11/11/2020), Primary localized osteoarthritis of right knee (05/24/2017), Ureterovaginal fistula, Uterine fibroid (1994), and Vaginal dysplasia. Past Surgical History Patient  has a past surgical history that includes Cystoscopy; Ureteral exploration (04/16/1992); Esophagogastroduodenoscopy (2010); Breast cyst aspiration (Right); Breast excisional biopsy (Right); Total abdominal hysterectomy (1994); Total knee arthroplasty (Right, 06/05/2017); Total knee arthroplasty (Right, 06/05/2017); Ueterovaginal fistual repair; Colonoscopy (2010);  Colonoscopy (2012); Colonoscopy (2016); Breast lumpectomy with radioactive seed and sentinel lymph node biopsy (Left, 10/15/2020); and Breast lumpectomy. Family History: Patient family history includes Brain cancer in her father; Breast cancer in her paternal aunt; Breast cancer (age of onset: 65) in her cousin; Cancer in her maternal grandmother; Diabetes in her brother, mother, and sister; Hypertension in her mother and sister; Pancreatic cancer (age of onset: 59) in her cousin; Uterine cancer in her cousin and paternal aunt. Social History:  Patient  reports that she has never smoked. She has never used smokeless tobacco. She reports that she does not drink alcohol and does not use drugs.  Review of Systems: Constitutional: negative for fever or malaise Ophthalmic: negative for photophobia, double vision or loss of vision Cardiovascular: negative for chest pain, dyspnea on exertion, or new LE swelling Respiratory: negative for SOB or persistent cough Gastrointestinal: negative for abdominal pain, change in bowel habits or melena Genitourinary: negative for dysuria or gross hematuria, no abnormal uterine bleeding or disharge Musculoskeletal: negative for new gait disturbance or muscular weakness Integumentary: negative for new or persistent rashes, no breast lumps Neurological: negative for TIA or stroke symptoms Psychiatric: negative for SI or delusions Allergic/Immunologic: negative for hives  Patient Care Team  Relationship Specialty Notifications Start End  Willow Ora, MD PCP - General Family Medicine  09/27/19   Salvatore Marvel, MD Consulting Physician Orthopedic Surgery  08/09/18   Genia Del, MD Consulting Physician Obstetrics and Gynecology  09/27/19   Almond Lint, MD Consulting Physician General Surgery  09/07/20   Antony Blackbird, MD Consulting Physician Radiation Oncology  09/07/20   Loa Socks, NP Nurse Practitioner Hematology and Oncology  12/25/20    Maryclare Labrador, RN Registered Nurse   12/25/20   Rachel Moulds, MD Consulting Physician Hematology and Oncology  04/07/21     Objective  Vitals: BP 130/76   Pulse 74   Temp 98 F (36.7 C)   Ht 4' 11.75" (1.518 m)   Wt 149 lb 9.6 oz (67.9 kg)   SpO2 98%   BMI 29.46 kg/m  General:  Well developed, well nourished, no acute distress  Psych:  Alert and orientedx3,normal mood and affect. HEENT:  Normocephalic, atraumatic, non-icteric sclera,  supple neck without adenopathy, mass or thyromegaly Cardiovascular:  Normal S1, S2, RRR without gallop, rub + murmur Respiratory:  Good breath sounds bilaterally, CTAB with normal respiratory effort Gastrointestinal: normal bowel sounds, soft, non-tender, no noted masses. No HSM MSK: extremities without edema, joints without erythema or swelling Neurologic:    Mental status is normal.  Gross motor and sensory exams are normal.  No tremor  Commons side effects, risks, benefits, and alternatives for medications and treatment plan prescribed today were discussed, and the patient expressed understanding of the given instructions. Patient is instructed to call or message via MyChart if he/she has any questions or concerns regarding our treatment plan. No barriers to understanding were identified. We discussed Red Flag symptoms and signs in detail. Patient expressed understanding regarding what to do in case of urgent or emergency type symptoms.  Medication list was reconciled, printed and provided to the patient in AVS. Patient instructions and summary information was reviewed with the patient as documented in the AVS. This note was prepared with assistance of Dragon voice recognition software. Occasional wrong-word or sound-a-like substitutions may have occurred due to the inherent limitations of voice recognition software

## 2022-08-11 LAB — URINE CULTURE
MICRO NUMBER:: 14960103
SPECIMEN QUALITY:: ADEQUATE

## 2022-08-14 MED ORDER — LOSARTAN POTASSIUM-HCTZ 100-12.5 MG PO TABS: 1.0000 | ORAL_TABLET | Freq: Every day | ORAL | 3 refills | Status: DC

## 2022-08-14 NOTE — Progress Notes (Signed)
Please call patient: labs show that her potassium remains a little low: therefore, I am changing her blood pressure pill: please stop the losartan hctz 100/25: I am changing it back to the losartan hctz 100/12.5; please continue the potassium pill daily. I would like to see her back in the office in 3 months to recheck these levels. Please have her schedule an appointment. Her cholesterol, sugar, urine and thyroid are fine. No other changes are needed at this time.

## 2022-08-14 NOTE — Addendum Note (Signed)
Addended by: Asencion Partridge on: 08/14/2022 08:34 AM   Modules accepted: Orders

## 2022-08-19 ENCOUNTER — Ambulatory Visit
Admission: RE | Admit: 2022-08-19 | Discharge: 2022-08-19 | Disposition: A | Discharge status: Home / Self Care | Payer: Medicare HMO | Source: Ambulatory Visit | Attending: Hematology and Oncology | Admitting: Hematology and Oncology

## 2022-08-19 DIAGNOSIS — Z170 Estrogen receptor positive status [ER+]: Secondary | ICD-10-CM

## 2022-08-19 DIAGNOSIS — R92323 Mammographic fibroglandular density, bilateral breasts: Secondary | ICD-10-CM | POA: Diagnosis not present

## 2022-09-19 ENCOUNTER — Ambulatory Visit (INDEPENDENT_AMBULATORY_CARE_PROVIDER_SITE_OTHER): Payer: Medicare HMO | Admitting: Family Medicine

## 2022-09-19 ENCOUNTER — Encounter: Payer: Self-pay | Admitting: Family Medicine

## 2022-09-19 ENCOUNTER — Telehealth: Payer: Self-pay | Admitting: Family Medicine

## 2022-09-19 VITALS — BP 125/73 | HR 73 | Temp 98.2°F | Ht 59.0 in | Wt 150.8 lb

## 2022-09-19 DIAGNOSIS — N390 Urinary tract infection, site not specified: Secondary | ICD-10-CM | POA: Diagnosis not present

## 2022-09-19 LAB — POCT URINALYSIS DIPSTICK
Bilirubin, UA: NEGATIVE
Blood, UA: POSITIVE
Glucose, UA: NEGATIVE
Ketones, UA: POSITIVE
Leukocytes, UA: NEGATIVE
Nitrite, UA: NEGATIVE
Protein, UA: NEGATIVE
Spec Grav, UA: <=1.005 — AB (ref 1.010–1.025)
Urobilinogen, UA: 0.2 E.U./dL
pH, UA: 6.5 (ref 5.0–8.0)

## 2022-09-19 MED ORDER — CIPROFLOXACIN HCL 500 MG PO TABS: 500.0000 mg | ORAL_TABLET | Freq: Two times a day (BID) | ORAL | 0 refills | Status: DC

## 2022-09-19 NOTE — Telephone Encounter (Signed)
FYI: This call has been transferred to Access Nurse. Once the result note has been entered staff can address the message at that time.  Patient called in with the following symptoms:  Red Word:dizziness , fever 101.2, chills, vomiting   Please advise at [phone]   Message is routed to Provider Pool and [facility] Triage

## 2022-09-19 NOTE — Patient Instructions (Addendum)
It was very nice to see you today!  You had a urinary tract infection.  I am concerned that it may be getting into your kidneys.  Please start the ciprofloxacin.  Make sure that you are getting plenty of fluids.  Let us know if not proving.  Return if symptoms worsen or fail to improve.   Take care, Dr Jimmey Ralph  PLEASE NOTE:  If you had any lab tests, please let us know if you have not heard back within a few days. You may see your results on mychart before we have a chance to review them but we will give you a call once they are reviewed by [place].   If we ordered any referrals today, please let us know if you have not heard from their office within the next week.   If you had any urgent prescriptions sent in today, please check with the pharmacy within an hour of our visit to make sure the prescription was transmitted appropriately.   Please try these tips to maintain a healthy lifestyle:  Eat at least 3 REAL meals and 1-2 snacks per day.  Aim for no more than 5 hours between eating.  If you eat breakfast, please do so within one hour of getting up.   Each meal should contain half fruits/vegetables, one quarter protein, and one quarter carbs (no bigger than a computer mouse)  Cut down on sweet beverages. This includes juice, soda, and sweet tea.   Drink at least 1 glass of water with each meal and aim for at least 8 glasses per day  Exercise at least 150 minutes every week.

## 2022-09-19 NOTE — Telephone Encounter (Signed)
Government social research officer at [facility] - Administrator, sports at [facility] Provider Asencion Partridge- MD Contact Type Call Who Is Calling Patient / Member / Family / Caregiver Caller Name Declined to provide Caller Phone Number Declined to provide Call Type Message Only Information Provided Reason for Call Request for General Office Information Initial Comment Caller states his wife has dizziness, fever of 101.2, vomiting and chills Additional Comment Caller Marcello Moores Hiemstra Pt Debra Simon Caller was transferred to me by office, says they want an appointment and to speak back with office, not a afterhours nurse. Declined to leave message. Disp. Time Disposition Final User 09/19/2022 9:53:07 AM General Information Provided Yes Enid Cutter Call Closed By: Enid Cutter Transaction Date/Time: 09/19/2022 9:46:29 AM (ET)

## 2022-09-19 NOTE — Progress Notes (Signed)
   Debra Simon is a 72 y.o. female who presents today for an office visit.  Assessment/Plan:  Urinary tract infection POC UA with positive ketones and RBCs.  Concern for possible early pyelonephritis given her systemic symptoms in addition to her dysuria.  Will empirically start cipro 500 mg twice daily x 7 days while we await culture results.  It is also possible that her nausea vomiting and fever could have been due to viral gastroenteritis which has since resolved.  Her exam today is reassuring and currently appears well.  We encouraged hydration.  We discussed reasons to return to care and seek emergent.  Follow-up as needed.    Subjective:  HPI:  Patient here with fever.  Symptoms started 3 days ago.  Associated symptoms include nausea, vomiting and dysuria.  Had a high fever of 101 F.  Tried taking Tylenol with improvement in her fever.  Symptoms are better today.  She did have some decreased appetite but this is better today as well.  She has been dealing with recurrent UTIs for the last several months and does not feel like it was fully eradicated.  She has not had a recurrence with nausea or vomiting.  No respiratory symptoms.  No rhinorrhea.  No cough or sore throat.  No constipation or diarrhea.  No hematochezia.  No melena.         Objective:  Physical Exam: BP 125/73   Pulse 73   Temp 98.2 F (36.8 C) (Temporal)   Ht 4\' 11"  (1.499 m)   Wt 150 lb 12.8 oz (68.4 kg)   SpO2 98%   BMI 30.46 kg/m   Gen: No acute distress, resting comfortably CV: Regular rate and rhythm with no murmurs appreciated Pulm: Normal work of breathing, clear to auscultation bilaterally with no crackles, wheezes, or rhonchi Neuro: Grossly normal, moves all extremities Psych: Normal affect and thought content      Debra Simon M. Jimmey Ralph, MD 09/19/2022 2:59 PM

## 2022-09-20 ENCOUNTER — Telehealth: Payer: Self-pay | Admitting: Family Medicine

## 2022-09-20 NOTE — Telephone Encounter (Signed)
Pts son called and states he wants a call back. He has questions on RX  ciprofloxacin (CIPRO) 500 MG tablet  Please advise.

## 2022-09-20 NOTE — Telephone Encounter (Signed)
Debra Simon has talked to the pt.

## 2022-09-20 NOTE — Telephone Encounter (Signed)
Return patient call stated, was unable to sleep, finger pain possible due to medication Rx Cipro  No symptoms with second dose Advise if sx continue give [place] a call

## 2022-09-21 LAB — URINE CULTURE
MICRO NUMBER:: 15119008
SPECIMEN QUALITY:: ADEQUATE

## 2022-09-22 NOTE — Progress Notes (Signed)
Urine culture confirms UTI.  The antibiotic we have her on should treat this.  She should let us know if symptoms do not improve.

## 2022-10-07 DIAGNOSIS — N3020 Other chronic cystitis without hematuria: Secondary | ICD-10-CM | POA: Diagnosis not present

## 2022-10-07 DIAGNOSIS — N390 Urinary tract infection, site not specified: Secondary | ICD-10-CM | POA: Diagnosis not present

## 2022-10-26 ENCOUNTER — Encounter (INDEPENDENT_AMBULATORY_CARE_PROVIDER_SITE_OTHER): Payer: Self-pay

## 2022-11-21 ENCOUNTER — Ambulatory Visit: Payer: Medicare HMO | Admitting: Family Medicine

## 2022-11-24 ENCOUNTER — Encounter: Payer: Self-pay | Admitting: Family Medicine

## 2022-11-24 ENCOUNTER — Ambulatory Visit (INDEPENDENT_AMBULATORY_CARE_PROVIDER_SITE_OTHER): Payer: Medicare HMO | Admitting: Family Medicine

## 2022-11-24 VITALS — BP 124/62 | HR 70 | Temp 98.3°F | Ht 59.0 in | Wt 149.2 lb

## 2022-11-24 DIAGNOSIS — H04123 Dry eye syndrome of bilateral lacrimal glands: Secondary | ICD-10-CM | POA: Diagnosis not present

## 2022-11-24 DIAGNOSIS — T502X5A Adverse effect of carbonic-anhydrase inhibitors, benzothiadiazides and other diuretics, initial encounter: Secondary | ICD-10-CM | POA: Diagnosis not present

## 2022-11-24 DIAGNOSIS — R682 Dry mouth, unspecified: Secondary | ICD-10-CM

## 2022-11-24 DIAGNOSIS — R7303 Prediabetes: Secondary | ICD-10-CM

## 2022-11-24 DIAGNOSIS — E876 Hypokalemia: Secondary | ICD-10-CM | POA: Diagnosis not present

## 2022-11-24 DIAGNOSIS — I10 Essential (primary) hypertension: Secondary | ICD-10-CM | POA: Diagnosis not present

## 2022-11-24 DIAGNOSIS — M542 Cervicalgia: Secondary | ICD-10-CM | POA: Diagnosis not present

## 2022-11-24 DIAGNOSIS — I739 Peripheral vascular disease, unspecified: Secondary | ICD-10-CM | POA: Diagnosis not present

## 2022-11-24 LAB — BASIC METABOLIC PANEL
BUN: 12 mg/dL (ref 6–23)
CO2: 29 mEq/L (ref 19–32)
Calcium: 10 mg/dL (ref 8.4–10.5)
Chloride: 97 mEq/L (ref 96–112)
Creatinine, Ser: 0.57 mg/dL (ref 0.40–1.20)
GFR: 91.68 mL/min (ref >60.00)
Glucose, Bld: 97 mg/dL (ref 70–99)
Potassium: 3.5 mEq/L (ref 3.5–5.1)
Sodium: 135 mEq/L (ref 135–145)

## 2022-11-24 NOTE — Patient Instructions (Signed)
Please return in 6 months for hypertension follow up.   If you have any questions or concerns, please don't hesitate to send me a message via MyChart or call the office at [phone]. Thank you for visiting with [place] today! It's our pleasure caring for you.

## 2022-11-24 NOTE — Progress Notes (Signed)
Subjective  CC:  Chief Complaint  Patient presents with   Hypertension    HPI: Debra Simon is a 72 y.o. female who presents to the office today to address the problems listed above in the chief complaint. Hypertension and hypokalemia follow-up: Patient was taking losartan HCTZ 100/25 but even with potassium supplements was hypokalemic.  We changes 3 months ago to losartan/HCTZ 100/12.5 and 20 mg potassium daily.  She is here for follow-up.  Home daily blood pressures remain very well-controlled, this morning it was 162/65.  These are consistent.  She feels fine. Complains of dry mouth: She does have dry eye syndrome.  She was started on methenamine for her preventative for recurrent urinary tract infection..  Dry mouth started about 2 months ago however the new medication was started just after that.  No other new medications. Still complaining of heaviness in the legs after walking for 10 to 15 minutes.  She reports she has to lie down to feel better.  She also notices that if she is very active at home, after walking around the house or doing chores she will feel heaviness in the lower extremities.  She denies specific calf pain.  No lower extremity edema.  No upper extremity symptoms.  No shortness of breath or chest pain Complains of left neck pain intermittently.  Assessment  1. Essential hypertension   2. Diuretic-induced hypokalemia   3. Intermittent claudication (HCC)   4. Neck pain   5. Prediabetes   6. Dry mouth and eyes      Plan   Hypertension f/u: BP control is well controlled.  Likely has mild whitecoat component.  Continue losartan/HCTZ 100/12.5 daily.  Recheck potassium levels on potassium supplements.  Can adjust down if needed. Possible claudication: Will order ABIs.  Dorsal pedal pulse is present but posterior tibial is faint Neck pain: Likely osteoarthritis given limited rotation and history.  Start Tylenol as needed.  Reassured.  Patient defers x-rays at this  time.  This is reasonable.  No radicular symptoms Prediabetes continue diabetic diet.  No symptoms of hyperglycemia are present.  Will check at next visit.  Check nonfasting glucose today Dry mouth and eyes: Suspect related to new medication but will rule out Sjogren's with antibody testing  Education regarding management of these chronic disease states was given. Management strategies discussed on successive visits include dietary and exercise recommendations, goals of achieving and maintaining IBW, and lifestyle modifications aiming for adequate sleep and minimizing stressors.   Follow up: 6 months to recheck  Orders Placed This Encounter  Procedures   US ARTERIAL ABI (SCREENING LOWER EXTREMITY)   Basic metabolic panel   Sjogrens syndrome-A extractable nuclear antibody   Sjogrens syndrome-B extractable nuclear antibody   Sedimentation rate   No orders of the defined types were placed in this encounter.     BP Readings from Last 3 Encounters:  11/24/22 (!) 152/62  09/19/22 125/73  08/10/22 130/76   Wt Readings from Last 3 Encounters:  11/24/22 149 lb 3.2 oz (67.7 kg)  09/19/22 150 lb 12.8 oz (68.4 kg)  08/10/22 149 lb 9.6 oz (67.9 kg)    Lab Results  Component Value Date   CHOL 130 08/10/2022   CHOL 126 08/06/2021   CHOL 102 07/08/2020   Lab Results  Component Value Date   HDL 51.50 08/10/2022   HDL 47.90 08/06/2021   HDL 44.50 07/08/2020   Lab Results  Component Value Date   LDLCALC 61 08/10/2022   LDLCALC 61  08/06/2021   LDLCALC 42 07/08/2020   Lab Results  Component Value Date   TRIG 88.0 08/10/2022   TRIG 85.0 08/06/2021   TRIG 80.0 07/08/2020   Lab Results  Component Value Date   CHOLHDL 3 08/10/2022   CHOLHDL 3 08/06/2021   CHOLHDL 2 07/08/2020   No results found for: "LDLDIRECT" Lab Results  Component Value Date   CREATININE 0.60 08/10/2022   BUN 12 08/10/2022   NA 135 08/10/2022   K 3.4 (L) 08/10/2022   CL 98 08/10/2022   CO2 28 08/10/2022     The 10-year ASCVD risk score (Arnett DK, et al., 2019) is: 17.5%   Values used to calculate the score:     Age: 7 years     Sex: Female     Is Non-Hispanic African American: No     Diabetic: No     Tobacco smoker: No     Systolic Blood Pressure: 152 mmHg     Is BP treated: Yes     HDL Cholesterol: 51.5 mg/dL     Total Cholesterol: 130 mg/dL  I reviewed the patients updated PMH, FH, and SocHx.    Patient Active Problem List   Diagnosis Date Noted   Prediabetes 11/11/2020    Priority: High   Malignant neoplasm of lower-outer quadrant of left breast of female, estrogen receptor positive (HCC) 09/07/2020    Priority: High   Serrated adenoma of colon 09/27/2019    Priority: High   Mixed hyperlipidemia 06/02/2016    Priority: High   Essential hypertension 07/31/2013    Priority: High   Blindness right eye category 3, blindness left eye category 3 09/09/2020    Priority: Medium    Umbilical hernia without obstruction and without gangrene 07/08/2020    Priority: Medium    History of lacunar cerebrovascular accident (CVA) 09/27/2019    Priority: Medium    Bilateral primary osteoarthritis of knee 06/05/2017    Priority: Medium    History of vaginal dysplasia 08/31/2016    Priority: Medium    Systolic murmur 08/06/2021    Priority: Low   Varicose veins of both lower extremities with pain 07/08/2020    Priority: Low   Diverticulosis     Priority: Low   Nonrheumatic mitral valve regurgitation 07/04/2016    Priority: Low   Sensorineural hearing loss (SNHL), bilateral 07/07/2015    Priority: Low   Rectocele 05/30/2014    Priority: Low   Genetic testing 09/16/2020   Family history of breast cancer 09/10/2020   Family history of brain cancer 09/10/2020   Family history of uterine cancer 09/10/2020   Family history of pancreatic cancer 09/10/2020    Allergies: Patient has no known allergies.  Social History: Patient  reports that she has never smoked. She has never used  smokeless tobacco. She reports that she does not drink alcohol and does not use drugs.  Current Meds  Medication Sig   anastrozole (ARIMIDEX) 1 MG tablet Take 1 tablet (1 mg total) by mouth daily. Start December 26, 2020   aspirin EC 81 MG tablet Take 1 tablet (81 mg total) by mouth daily.   Cranberry 50 MG CHEW Chew by mouth.   Lactobacillus (PROBIOTIC ACIDOPHILUS PO) Take by mouth.   losartan-hydrochlorothiazide (HYZAAR) 100-12.5 MG tablet Take 1 tablet by mouth daily.   methenamine (HIPREX) 1 g tablet Take 1 g by mouth 2 (two) times daily with a meal.   potassium chloride SA (KLOR-CON M) 20 MEQ tablet Take 1  tablet (20 mEq total) by mouth daily.   simvastatin (ZOCOR) 20 MG tablet TAKE 1 TABLET BY MOUTH EVERYDAY AT BEDTIME    Review of Systems: Cardiovascular: negative for chest pain, palpitations, leg swelling, orthopnea Respiratory: negative for SOB, wheezing or persistent cough Gastrointestinal: negative for abdominal pain Genitourinary: negative for dysuria or gross hematuria  Objective  Vitals: BP (!) 152/62   Pulse 70   Temp 98.3 F (36.8 C)   Ht 4\' 11"  (1.499 m)   Wt 149 lb 3.2 oz (67.7 kg)   SpO2 98%   BMI 30.13 kg/m  General: no acute distress  Psych:  Alert and oriented, normal mood and affect HEENT:  Normocephalic, atraumatic, supple neck, dry oral mucous membranes Cardiovascular:  RRR with systolic murmur. no edema Respiratory:  Good breath sounds bilaterally, CTAB with normal respiratory effort Skin:  Warm, no rashes Extremities: No calf tenderness or cords Neurologic:   Mental status is normal Commons side effects, risks, benefits, and alternatives for medications and treatment plan prescribed today were discussed, and the patient expressed understanding of the given instructions. Patient is instructed to call or message via MyChart if he/she has any questions or concerns regarding our treatment plan. No barriers to understanding were identified. We discussed Red  Flag symptoms and signs in detail. Patient expressed understanding regarding what to do in case of urgent or emergency type symptoms.  Medication list was reconciled, printed and provided to the patient in AVS. Patient instructions and summary information was reviewed with the patient as documented in the AVS. This note was prepared with assistance of Dragon voice recognition software. Occasional wrong-word or sound-a-like substitutions may have occurred due to the inherent limitation

## 2022-11-30 NOTE — Progress Notes (Signed)
Please call patient:potassium level is low normal now. Please continue taking the potassium tablet daily with the blood pressure pill.  thanks

## 2022-12-04 ENCOUNTER — Other Ambulatory Visit: Payer: Self-pay | Admitting: Hematology and Oncology

## 2022-12-07 ENCOUNTER — Ambulatory Visit
Admission: RE | Admit: 2022-12-07 | Discharge: 2022-12-07 | Disposition: A | Discharge status: Home / Self Care | Payer: Medicare HMO | Source: Ambulatory Visit | Attending: Family Medicine | Admitting: Family Medicine

## 2022-12-07 DIAGNOSIS — I739 Peripheral vascular disease, unspecified: Secondary | ICD-10-CM | POA: Diagnosis not present

## 2022-12-07 DIAGNOSIS — I10 Essential (primary) hypertension: Secondary | ICD-10-CM | POA: Diagnosis not present

## 2022-12-07 NOTE — Progress Notes (Signed)
Please call patient: Please let patient know that her vascular studies were normal on both sides

## 2022-12-12 ENCOUNTER — Ambulatory Visit (INDEPENDENT_AMBULATORY_CARE_PROVIDER_SITE_OTHER): Payer: Medicare HMO

## 2022-12-12 VITALS — Wt 149.0 lb

## 2022-12-12 DIAGNOSIS — Z0000 Encounter for general adult medical examination without abnormal findings: Secondary | ICD-10-CM | POA: Diagnosis not present

## 2022-12-12 NOTE — Patient Instructions (Signed)
Ms. Gutermuth , Thank you for taking time to come for your Medicare Wellness Visit. I appreciate your ongoing commitment to your health goals. Please review the following plan we discussed and let me know if I can assist you in the future.   Referrals/Orders/Follow-Ups/Clinician Recommendations: continue to exercise   This is a list of the screening recommended for you and due dates:  Health Maintenance  Topic Date Due   Flu Shot  10/27/2022   COVID-19 Vaccine (4 - 2023-24 season) 11/27/2022   Mammogram  08/19/2023   Medicare Annual Wellness Visit  12/12/2023   Colon Cancer Screening  02/13/2025   DEXA scan (bone density measurement)  08/18/2026   DTaP/Tdap/Td vaccine (2 - Td or Tdap) 01/23/2028   Pneumonia Vaccine  Completed   Hepatitis C Screening  Completed   Zoster (Shingles) Vaccine  Completed   HPV Vaccine  Aged Out    Advanced directives: (Provided) Advance directive discussed with you today. I have provided a copy for you to complete at home and have notarized. Once this is complete, please bring a copy in to our office so we can scan it into your chart.   Next Medicare Annual Wellness Visit scheduled for next year: Yes

## 2022-12-12 NOTE — Progress Notes (Signed)
Subjective:   Debra Simon is a 72 y.o. female who presents for Medicare Annual (Subsequent) preventive examination.  Visit Complete: Virtual  I connected with  Debra Simon on 12/12/22 by a audio enabled telemedicine application and verified that I am speaking with the correct person using two identifiers.  Patient Location: Home  Provider Location: Office/Clinic  I discussed the limitations of evaluation and management by telemedicine. The patient expressed understanding and agreed to proceed.   Vital Signs: Unable to obtain new vitals due to this being a telehealth visit.   Cardiac Risk Factors include: advanced age (>75men, >57 women);dyslipidemia;hypertension;obesity (BMI >30kg/m2)     Objective:    Today's Vitals   12/12/22 1508  Weight: 149 lb (67.6 kg)   Body mass index is 30.09 kg/m.     12/12/2022    3:16 PM 01/18/2021    3:04 PM 12/11/2020   10:23 AM 11/11/2020    1:29 PM 10/12/2020   11:35 AM 09/09/2020   11:58 AM 11/21/2019   10:23 AM  Advanced Directives  Does Patient Have a Medical Advance Directive? Yes No Yes No No No No  Type of Estate agent of [place];Living will        Does patient want to make changes to medical advance directive?   Yes (MAU/Ambulatory/Procedural Areas - Information given)      Copy of Healthcare Power of Attorney in Chart? No - copy requested        Would patient like information on creating a medical advance directive?  No - Patient declined  Yes (MAU/Ambulatory/Procedural Areas - Information given) Yes (MAU/Ambulatory/Procedural Areas - Information given) No - Patient declined Yes (MAU/Ambulatory/Procedural Areas - Information given)    Current Medications (verified) Outpatient Encounter Medications as of 12/12/2022  Medication Sig   anastrozole (ARIMIDEX) 1 MG tablet TAKE 1 TABLET (1 MG TOTAL) BY MOUTH DAILY. START December 26, 2020   aspirin EC 81 MG tablet Take 1 tablet (81 mg total) by mouth daily.    Cranberry 50 MG CHEW Chew by mouth.   Lactobacillus (PROBIOTIC ACIDOPHILUS PO) Take by mouth.   losartan-hydrochlorothiazide (HYZAAR) 100-12.5 MG tablet Take 1 tablet by mouth daily.   methenamine (HIPREX) 1 g tablet Take 1 g by mouth 2 (two) times daily with a meal.   potassium chloride SA (KLOR-CON M) 20 MEQ tablet Take 1 tablet (20 mEq total) by mouth daily.   simvastatin (ZOCOR) 20 MG tablet TAKE 1 TABLET BY MOUTH EVERYDAY AT BEDTIME   No facility-administered encounter medications on file as of 12/12/2022.    Allergies (verified) Patient has no known allergies.   History: Past Medical History:  Diagnosis Date   Anxiety    Arthritis    Breast cancer (HCC)    left breast 2022   Colon polyps 2010   Diastolic dysfunction, left ventricle 06/26/2016   (ECHO 2018): Left ventricle: The cavity size was normal. Systolic function was normal. The estimated ejection fraction was in the range of 60% to 65%. Wall motion was normal; there were no regional wall motion abnormalities. Doppler parameters are consistent with abnormal left ventricular relaxation (grade 1 diastolic dysfunction).   Diverticulosis    Family history of brain cancer    Family history of breast cancer    Family history of pancreatic cancer    Family history of uterine cancer    GERD (gastroesophageal reflux disease)    watches what she eats   Headache    gets headaches when she eats  sweets   Heart murmur    dx last yr.   had echo 05/2016   History of radiation therapy    Left breast- 11/18/20-12/17/20- Dr. Antony Blackbird   Hyperlipidemia    Hypertension    Mild mitral regurgitation 07/04/2016   Personal history of radiation therapy    Pneumonia    Pre-diabetes    Prediabetes 11/11/2020   Primary localized osteoarthritis of right knee 05/24/2017   Ureterovaginal fistula    Uterine fibroid 1994   Vaginal dysplasia    Past Surgical History:  Procedure Laterality Date   BREAST CYST ASPIRATION Right    BREAST  EXCISIONAL BIOPSY Right    BREAST LUMPECTOMY     BREAST LUMPECTOMY WITH RADIOACTIVE SEED AND SENTINEL LYMPH NODE BIOPSY Left 10/15/2020   Procedure: LEFT BREAST LUMPECTOMY WITH RADIOACTIVE SEED AND SENTINEL LYMPH NODE BIOPSY;  Surgeon: Almond Lint, MD;  Location: [facility] OR;  Service: General;  Laterality: Left;   COLONOSCOPY  2010   COLONOSCOPY  2012   COLONOSCOPY  2016   CYSTOSCOPY     ESOPHAGOGASTRODUODENOSCOPY  2010   TOTAL ABDOMINAL HYSTERECTOMY  1994   TOTAL KNEE ARTHROPLASTY Right 06/05/2017   TOTAL KNEE ARTHROPLASTY Right 06/05/2017   Procedure: RIGHT TOTAL KNEE ARTHROPLASTY;  Surgeon: Salvatore Marvel, MD;  Location: [facility] OR;  Service: Orthopedics;  Laterality: Right;   Ueterovaginal fistual repair     URETERAL EXPLORATION  04/16/1992   URETEROTOMY ON THE RIGHT, REPAIR LACERATION ON THE LEFT   Family History  Problem Relation Age of Onset   Diabetes Mother    Hypertension Mother    Brain cancer Father        d. 18   Hypertension Sister    Diabetes Sister    Diabetes Brother    Breast cancer Paternal Aunt        dx >50   Uterine cancer Paternal Aunt        dx >50   Cancer Maternal Grandmother        unknown type, died in her 27s   Pancreatic cancer Cousin 22       maternal first cousin   Uterine cancer Cousin        dx 55s, maternal first cousin   Breast cancer Cousin 67       paternal first cousin   Social History   Socioeconomic History   Marital status: Married    Spouse name: Not on file   Number of children: Not on file   Years of education: Not on file   Highest education level: GED or equivalent  Occupational History   Occupation: Housewife  Tobacco Use   Smoking status: Never   Smokeless tobacco: Never  Vaping Use   Vaping status: Never Used  Substance and Sexual Activity   Alcohol use: No   Drug use: No   Sexual activity: Not Currently    Partners: Male    Birth control/protection: Surgical    Comment: hysterectomy, older than 16, less than 5   Other Topics Concern   Not on file  Social History Narrative   Not on file   Social Determinants of Health   Financial Resource Strain: Low Risk  (12/12/2022)   Overall Financial Resource Strain (CARDIA)    Difficulty of Paying Living Expenses: Not hard at all  Food Insecurity: No Food Insecurity (12/12/2022)   Hunger Vital Sign    Worried About Running Out of Food in the Last Year: Never true    Ran Out  of Food in the Last Year: Never true  Transportation Needs: No Transportation Needs (12/12/2022)   PRAPARE - Administrator, Civil Service (Medical): No    Lack of Transportation (Non-Medical): No  Physical Activity: Sufficiently Active (12/12/2022)   Exercise Vital Sign    Days of Exercise per Week: 5 days    Minutes of Exercise per Session: 30 min  Stress: No Stress Concern Present (12/12/2022)   [employer] of Occupational Health - Occupational Stress Questionnaire    Feeling of Stress : Not at all  Social Connections: Moderately Integrated (12/12/2022)   Social Connection and Isolation Panel [NHANES]    Frequency of Communication with Friends and Family: More than three times a week    Frequency of Social Gatherings with Friends and Family: More than three times a week    Attends Religious Services: More than 4 times per year    Active Member of [employer] or Organizations: No    Attends Engineer, structural: Never    Marital Status: Married    Tobacco Counseling Counseling given: Not Answered   Clinical Intake:  Pre-visit preparation completed: Yes  Pain : No/denies pain     BMI - recorded: 30.09 Nutritional Status: BMI > 30  Obese Nutritional Risks: None Diabetes: No  How often do you need to have someone help you when you read instructions, pamphlets, or other written materials from your doctor or pharmacy?: 1 - Never  Interpreter Needed?: No  Information entered by :: Lanier Ensign, LPN   Activities of Daily Living    12/12/2022     3:09 PM  In your present state of health, do you have any difficulty performing the following activities:  Hearing? 1  Comment HOH  Vision? 0  Difficulty concentrating or making decisions? 0  Walking or climbing stairs? 0  Dressing or bathing? 0  Doing errands, shopping? 0  Preparing Food and eating ? N  Using the Toilet? N  In the past six months, have you accidently leaked urine? Y  Comment at times wears a iner  Do you have problems with loss of bowel control? N  Managing your Medications? N  Managing your Finances? N  Housekeeping or managing your Housekeeping? N    Patient Care Team: Willow Ora, MD as PCP - General (Family Medicine) Salvatore Marvel, MD as Consulting Physician (Orthopedic Surgery) Genia Del, MD as Consulting Physician (Obstetrics and Gynecology) Almond Lint, MD as Consulting Physician (General Surgery) Antony Blackbird, MD as Consulting Physician (Radiation Oncology) Axel Filler, Larna Daughters, NP as Nurse Practitioner (Hematology and Oncology) Maryclare Labrador, RN as Registered Nurse Rachel Moulds, MD as Consulting Physician (Hematology and Oncology)  Indicate any recent Medical Services you may have received from other than [facility] providers in the past year (date may be approximate).     Assessment:   This is a routine wellness examination for Debra Simon.  Hearing/Vision screen Hearing Screening - Comments:: Pt stated HOH  Vision Screening - Comments:: Pt follows up with [facility] for annual eye exams    Goals Addressed             This Visit's Progress    Patient Stated       Continue to exercise        Depression Screen    12/12/2022    3:14 PM 11/24/2022   11:18 AM 08/10/2022   10:26 AM 06/10/2022   10:55 AM 03/31/2022   11:27 AM 02/10/2022   11:10 AM  01/21/2021    9:00 AM  PHQ 2/9 Scores  PHQ - 2 Score 0 0 0 0 0 0 0    Fall Risk    12/12/2022    3:17 PM 11/24/2022   11:18 AM 08/10/2022   10:25 AM 07/20/2022   11:25 AM  06/10/2022   10:55 AM  Fall Risk   Falls in the past year? 0 0 0 0 0  Number falls in past yr: 0 0 0 0 0  Injury with Fall? 0 0 0 0 0  Risk for fall due to : Impaired balance/gait;Impaired vision No Fall Risks No Fall Risks No Fall Risks No Fall Risks  Follow up Falls prevention discussed Falls evaluation completed Falls evaluation completed Falls evaluation completed Falls evaluation completed    MEDICARE RISK AT HOME: Medicare Risk at Home Any stairs in or around the home?: Yes If so, are there any without handrails?: No Home free of loose throw rugs in walkways, pet beds, electrical cords, etc?: Yes Adequate lighting in your home to reduce risk of falls?: Yes Life alert?: No Use of a cane, walker or w/c?: No Grab bars in the bathroom?: Yes Shower chair or bench in shower?: No Elevated toilet seat or a handicapped toilet?: No  TIMED UP AND GO:  Was the test performed?  No    Cognitive Function:        12/11/2020   10:37 AM 11/21/2019   10:47 AM  6CIT Screen  What Year? 0 points 0 points  What month? 0 points 0 points  What time? 0 points   Count back from 20 0 points 0 points  Months in reverse 0 points 2 points  Repeat phrase 0 points 0 points  Total Score 0 points     Immunizations Immunization History  Administered Date(s) Administered   Fluad Quad(high Dose 65+) 01/08/2020, 01/04/2021, 02/10/2022   Influenza, High Dose Seasonal PF 12/26/2017   Influenza,inj,Quad PF,6+ Mos 01/09/2019   Influenza-Unspecified 01/04/2021   Moderna Sars-Covid-2 Vaccination 07/26/2019, 08/23/2019   PFIZER(Purple Top)SARS-COV-2 Vaccination 03/05/2020   Pneumococcal Conjugate-13 12/26/2017   Pneumococcal Polysaccharide-23 09/27/2019   Tdap 01/22/2018   Zoster Recombinant(Shingrix) 07/22/2016, 11/18/2016    TDAP status: Up to date  Flu Vaccine status: Due, Education has been provided regarding the importance of this vaccine. Advised may receive this vaccine at local pharmacy or  Health Dept. Aware to provide a copy of the vaccination record if obtained from local pharmacy or Health Dept. Verbalized acceptance and understanding.  Pneumococcal vaccine status: Up to date  Covid-19 vaccine status: Information provided on how to obtain vaccines.   Qualifies for Shingles Vaccine? Yes   Zostavax completed Yes   Shingrix Completed?: Yes  Screening Tests Health Maintenance  Topic Date Due   COVID-19 Vaccine (4 - 2023-24 season) 11/27/2022   INFLUENZA VACCINE  06/26/2023 (Originally 10/27/2022)   MAMMOGRAM  08/19/2023   Medicare Annual Wellness (AWV)  12/12/2023   Colonoscopy  02/13/2025   DEXA SCAN  08/18/2026   DTaP/Tdap/Td (2 - Td or Tdap) 01/23/2028   Pneumonia Vaccine 74+ Years old  Completed   Hepatitis C Screening  Completed   Zoster Vaccines- Shingrix  Completed   HPV VACCINES  Aged Out    Health Maintenance  Health Maintenance Due  Topic Date Due   COVID-19 Vaccine (4 - 2023-24 season) 11/27/2022    Colorectal cancer screening: Type of screening: Colonoscopy. Completed 02/14/20. Repeat every 5 years  Mammogram status: Completed 08/19/22. Repeat every year  Bone Density status: Completed 08/17/21. Results reflect: Bone density results: NORMAL. Repeat every 5 years.    Additional Screening:  Hepatitis C Screening:  Completed 01/20/17  Vision Screening: Recommended annual ophthalmology exams for early detection of glaucoma and other disorders of the eye. Is the patient up to date with their annual eye exam?  Yes  Who is the provider or what is the name of the office in which the patient attends annual eye exams? [facility]  If pt is not established with a provider, would they like to be referred to a provider to establish care? No .   Dental Screening: Recommended annual dental exams for proper oral hygiene   Community Resource Referral / Chronic Care Management: CRR required this visit?  No   CCM required this visit?  No     Plan:      I have personally reviewed and noted the following in the patient's chart:   Medical and social history Use of alcohol, tobacco or illicit drugs  Current medications and supplements including opioid prescriptions. Patient is not currently taking opioid prescriptions. Functional ability and status Nutritional status Physical activity Advanced directives List of other physicians Hospitalizations, surgeries, and ER visits in previous 12 months Vitals Screenings to include cognitive, depression, and falls Referrals and appointments  In addition, I have reviewed and discussed with patient certain preventive protocols, quality metrics, and best practice recommendations. A written personalized care plan for preventive services as well as general preventive health recommendations were provided to patient.     Marzella Schlein, LPN   [id number]   After Visit Summary: (MyChart) Due to this being a telephonic visit, the after visit summary with patients personalized plan was offered to patient via MyChart   Nurse Notes: none

## 2022-12-26 ENCOUNTER — Other Ambulatory Visit: Payer: Self-pay | Admitting: Family Medicine

## 2022-12-26 DIAGNOSIS — E785 Hyperlipidemia, unspecified: Secondary | ICD-10-CM

## 2023-01-02 ENCOUNTER — Ambulatory Visit: Payer: Medicare HMO | Attending: General Surgery

## 2023-01-02 VITALS — Wt 151.5 lb

## 2023-01-02 DIAGNOSIS — Z483 Aftercare following surgery for neoplasm: Secondary | ICD-10-CM | POA: Insufficient documentation

## 2023-01-02 NOTE — Therapy (Signed)
OUTPATIENT PHYSICAL THERAPY SOZO SCREENING NOTE   Patient Name: Debra Simon MRN: [id number] DOB:March 18, 1951, 72 y.o., female Today's Date: 01/02/2023  PCP: Willow Ora, MD REFERRING PROVIDER: Willow Ora, MD   PT End of Session - 01/02/23 [phone]     Visit Number 1   # unchanged due to screen only   PT Start Time 0954    PT Stop Time 0958    PT Time Calculation (min) 4 min    Activity Tolerance Patient tolerated treatment well    Behavior During Therapy [facility] for tasks assessed/performed             Past Medical History:  Diagnosis Date   Anxiety    Arthritis    Breast cancer (HCC)    left breast 2022   Colon polyps 2010   Diastolic dysfunction, left ventricle 06/26/2016   (ECHO 2018): Left ventricle: The cavity size was normal. Systolic function was normal. The estimated ejection fraction was in the range of 60% to 65%. Wall motion was normal; there were no regional wall motion abnormalities. Doppler parameters are consistent with abnormal left ventricular relaxation (grade 1 diastolic dysfunction).   Diverticulosis    Family history of brain cancer    Family history of breast cancer    Family history of pancreatic cancer    Family history of uterine cancer    GERD (gastroesophageal reflux disease)    watches what she eats   Headache    gets headaches when she eats sweets   Heart murmur    dx last yr.   had echo 05/2016   History of radiation therapy    Left breast- 11/18/20-12/17/20- Dr. Antony Blackbird   Hyperlipidemia    Hypertension    Mild mitral regurgitation 07/04/2016   Personal history of radiation therapy    Pneumonia    Pre-diabetes    Prediabetes 11/11/2020   Primary localized osteoarthritis of right knee 05/24/2017   Ureterovaginal fistula    Uterine fibroid 1994   Vaginal dysplasia    Past Surgical History:  Procedure Laterality Date   BREAST CYST ASPIRATION Right    BREAST EXCISIONAL BIOPSY Right    BREAST LUMPECTOMY     BREAST  LUMPECTOMY WITH RADIOACTIVE SEED AND SENTINEL LYMPH NODE BIOPSY Left 10/15/2020   Procedure: LEFT BREAST LUMPECTOMY WITH RADIOACTIVE SEED AND SENTINEL LYMPH NODE BIOPSY;  Surgeon: Almond Lint, MD;  Location: [facility] OR;  Service: General;  Laterality: Left;   COLONOSCOPY  2010   COLONOSCOPY  2012   COLONOSCOPY  2016   CYSTOSCOPY     ESOPHAGOGASTRODUODENOSCOPY  2010   TOTAL ABDOMINAL HYSTERECTOMY  1994   TOTAL KNEE ARTHROPLASTY Right 06/05/2017   TOTAL KNEE ARTHROPLASTY Right 06/05/2017   Procedure: RIGHT TOTAL KNEE ARTHROPLASTY;  Surgeon: Salvatore Marvel, MD;  Location: [facility] OR;  Service: Orthopedics;  Laterality: Right;   Ueterovaginal fistual repair     URETERAL EXPLORATION  04/16/1992   URETEROTOMY ON THE RIGHT, REPAIR LACERATION ON THE LEFT   Patient Active Problem List   Diagnosis Date Noted   Systolic murmur 08/06/2021   Prediabetes 11/11/2020   Genetic testing 09/16/2020   Family history of breast cancer 09/10/2020   Family history of brain cancer 09/10/2020   Family history of uterine cancer 09/10/2020   Family history of pancreatic cancer 09/10/2020   Blindness right eye category 3, blindness left eye category 3 09/09/2020   Malignant neoplasm of lower-outer quadrant of left breast of female, estrogen receptor positive (HCC) 09/07/2020  Varicose veins of both lower extremities with pain 07/08/2020   Umbilical hernia without obstruction and without gangrene 07/08/2020   History of lacunar cerebrovascular accident (CVA) 09/27/2019   Serrated adenoma of colon 09/27/2019   Bilateral primary osteoarthritis of knee 06/05/2017   Diverticulosis    History of vaginal dysplasia 08/31/2016   Nonrheumatic mitral valve regurgitation 07/04/2016   Mixed hyperlipidemia 06/02/2016   Sensorineural hearing loss (SNHL), bilateral 07/07/2015   Rectocele 05/30/2014   Essential hypertension 07/31/2013    REFERRING DIAG: left breast cancer at risk for lymphedema  THERAPY DIAG: Aftercare  following surgery for neoplasm  PERTINENT HISTORY: Patient was diagnosed on 08/28/2020 with left grade I-II invasive ductal carcinoma breast cancer. She had a left lumpectomy and sentinel node biopsy on 10/15/2020 with 5 negative axillary nodes removed. It is ER/PR positive and HER2 negative with a Ki67 of 15%.   PRECAUTIONS: left UE Lymphedema risk, None  SUBJECTIVE: Pt returns for her first 6 month L-Dex screen.   PAIN:  Are you having pain? No  SOZO SCREENING: Patient was assessed today using the SOZO machine to determine the lymphedema index score. This was compared to her baseline score. It was determined that she is within the recommended range when compared to her baseline and no further action is needed at this time. She will continue SOZO screenings. These are done every 3 months for 2 years post operatively followed by every 6 months for 2 years, and then annually.    L-DEX FLOWSHEETS - 01/02/23 0900       L-DEX LYMPHEDEMA SCREENING   Measurement Type Unilateral    L-DEX MEASUREMENT EXTREMITY Upper Extremity    POSITION  Standing    DOMINANT SIDE Right    At Risk Side Left    BASELINE SCORE (UNILATERAL) 2.3    L-DEX SCORE (UNILATERAL) 2    VALUE CHANGE (UNILAT) -0.3               Hermenia Bers, PTA 01/02/2023, 9:57 AM

## 2023-01-13 DIAGNOSIS — N3020 Other chronic cystitis without hematuria: Secondary | ICD-10-CM | POA: Diagnosis not present

## 2023-02-04 ENCOUNTER — Other Ambulatory Visit: Payer: Self-pay | Admitting: Family Medicine

## 2023-02-06 ENCOUNTER — Ambulatory Visit: Payer: Medicare HMO | Admitting: Family Medicine

## 2023-02-06 ENCOUNTER — Encounter: Payer: Self-pay | Admitting: Family Medicine

## 2023-02-06 VITALS — BP 138/76 | HR 116 | Temp 100.0°F | Ht 59.0 in | Wt 149.4 lb

## 2023-02-06 DIAGNOSIS — R5383 Other fatigue: Secondary | ICD-10-CM

## 2023-02-06 DIAGNOSIS — R350 Frequency of micturition: Secondary | ICD-10-CM | POA: Diagnosis not present

## 2023-02-06 DIAGNOSIS — R509 Fever, unspecified: Secondary | ICD-10-CM

## 2023-02-06 LAB — POCT URINALYSIS DIPSTICK
Bilirubin, UA: NEGATIVE
Blood, UA: POSITIVE — AB
Glucose, UA: NEGATIVE
Ketones, UA: 5 — ABNORMAL HIGH
Nitrite, UA: NEGATIVE
Protein, UA: POSITIVE — AB
Spec Grav, UA: 1.01 (ref 1.010–1.025)
Urobilinogen, UA: 0.2 U/dL
pH, UA: 7 (ref 5.0–8.0)

## 2023-02-06 LAB — POC COVID19 BINAXNOW: SARS Coronavirus 2 Ag: POSITIVE — AB

## 2023-02-06 MED ORDER — CEPHALEXIN 500 MG PO CAPS: 500.0000 mg | ORAL_CAPSULE | Freq: Two times a day (BID) | ORAL | 0 refills | Status: AC

## 2023-02-06 NOTE — Patient Instructions (Addendum)
Follow up as needed or as scheduled START the Cephalexin twice daily SCHEDULE a follow up w/ urology regarding the infections Drink LOTS of fluids Continue Tylenol as needed for fever REST! I'm not convinced that the COVID test is accurate as this seems to be a faulty test Call with any questions or concerns Hang in there!!!

## 2023-02-06 NOTE — Progress Notes (Signed)
   Subjective:    Patient ID: Debra Simon, female    DOB: Jan 24, 1951, 72 y.o.   MRN: [id number]  HPI Fever- sxs started Friday night.  Sxs started w/ increased urinary frequency, increased pelvic pressure, chills.  + nausea.  No HA.  Denies cough or congestion.  No body aches.  + fatigue.  Fever started Friday night- will improve w/ Tylenol but then return.  Pt reports this feels similar to previous UTIs.  Has a urologist- was given Methenamine 1gm BID which initially helped prevent infxns but sxs have returned.     Review of Systems For ROS see HPI     Objective:   Physical Exam Vitals reviewed.  Constitutional:      General: She is not in acute distress.    Appearance: Normal appearance. She is not ill-appearing.  HENT:     Head: Normocephalic and atraumatic.     Nose: Nose normal. No congestion or rhinorrhea.  Eyes:     Extraocular Movements: Extraocular movements intact.     Conjunctiva/sclera: Conjunctivae normal.  Cardiovascular:     Rate and Rhythm: Regular rhythm. Tachycardia present.  Pulmonary:     Effort: Pulmonary effort is normal. No respiratory distress.     Breath sounds: No wheezing or rhonchi.     Comments: No cough Abdominal:     General: There is no distension.     Palpations: Abdomen is soft.     Tenderness: There is no abdominal tenderness. There is no right CVA tenderness, left CVA tenderness, guarding or rebound.  Musculoskeletal:     Cervical back: Neck supple.  Lymphadenopathy:     Cervical: No cervical adenopathy.  Skin:    General: Skin is warm and dry.  Neurological:     General: No focal deficit present.     Mental Status: She is alert and oriented to person, place, and time.  Psychiatric:        Mood and Affect: Mood normal.        Behavior: Behavior normal.        Thought Content: Thought content normal.           Assessment & Plan:  Frequent Urination- new to provider, recurrent problem for pt.  She has a urologist and is on UTI  prophylaxis.  Per family member (who is translating) the medication initially worked but now this is similar to how previous infxns have felt.  UA is suspicious for infxn.  Reviewed last 3 cultures and all have been pan sensitive.  Will start Keflex 500mg  BID x7 days and await culture results.  In the meantime, encouraged them to schedule f/u w/ Urology.  Pt expressed understanding and is in agreement w/ plan.   Fever/fatigue- new.  Suspect this is related to UTI above.  There was some question as to COVID but she is not having any respiratory sxs or headache.  No body aches.  The test showed a line at the sample level almost immediately but then seemingly disappeared and the control line is extremely faint.  Clinically this is not COVID.  Discussed this w/ pt and family member and they agree that this feels like previous UTIs and not previous COVID infxns.  Will hold on antivirals at this time.

## 2023-02-08 ENCOUNTER — Inpatient Hospital Stay: Payer: Medicare HMO | Attending: Hematology and Oncology | Admitting: Hematology and Oncology

## 2023-02-08 ENCOUNTER — Other Ambulatory Visit: Payer: Self-pay

## 2023-02-08 VITALS — BP 137/75 | HR 83 | Temp 97.7°F | Resp 16 | Wt 148.2 lb

## 2023-02-08 DIAGNOSIS — Z170 Estrogen receptor positive status [ER+]: Secondary | ICD-10-CM | POA: Insufficient documentation

## 2023-02-08 DIAGNOSIS — Z79899 Other long term (current) drug therapy: Secondary | ICD-10-CM | POA: Diagnosis not present

## 2023-02-08 DIAGNOSIS — C50512 Malignant neoplasm of lower-outer quadrant of left female breast: Secondary | ICD-10-CM | POA: Diagnosis not present

## 2023-02-08 DIAGNOSIS — Z79811 Long term (current) use of aromatase inhibitors: Secondary | ICD-10-CM | POA: Diagnosis not present

## 2023-02-08 DIAGNOSIS — Z923 Personal history of irradiation: Secondary | ICD-10-CM | POA: Diagnosis not present

## 2023-02-08 LAB — URINE CULTURE
MICRO NUMBER:: 15713267
SPECIMEN QUALITY:: ADEQUATE

## 2023-02-08 NOTE — Progress Notes (Signed)
[facility]  Telephone:([phone]) [phone] Fax:([phone]) [phone]     ID: Debra Simon DOB: [birth year]  MR#: [id number]  JYN#:[id number]  Patient Care Team: Willow Ora, MD as PCP - General (Family Medicine) Salvatore Marvel, MD as Consulting Physician (Orthopedic Surgery) Genia Del, MD as Consulting Physician (Obstetrics and Gynecology) Almond Lint, MD as Consulting Physician (General Surgery) Antony Blackbird, MD as Consulting Physician (Radiation Oncology) Axel Filler, Larna Daughters, NP as Nurse Practitioner (Hematology and Oncology) Maryclare Labrador, RN as Registered Nurse Rachel Moulds, MD as Consulting Physician (Hematology and Oncology) Rachel Moulds, MD  CHIEF COMPLAINT: estrogen receptor positive breast cancer  CURRENT TREATMENT: Anastrozole  Discussed the use of AI scribe software for clinical note transcription with the patient, who gave verbal consent to proceed.  History of Present Illness    Debra Simon returns today for follow up of her estrogen receptor positive breast cancer.  The patient, with a history of breast cancer, presents with dry skin and recurrent urinary tract infections. She has been on anastrozole for her breast cancer. Initially, she experienced fatigue and nausea, which resolved over time. However, she noticed that her skin and hair have become dry, which she suspects might be a side effect of the medication. Over the past year, she has had five urinary tract infections, each requiring antibiotic treatment. She is currently taking Hiprex twice daily as a preventive measure against urinary infections. Despite this, she recently had to visit the ER due to fever. She also reports that her urinary infections seem to occur after she travels. She is reluctant to switch to a different anti estrogen therapy. Rest of the pertinent 10 point ROS reviewed and neg    COVID 19 VACCINATION STATUS: fully vaccinated, Moderna x2 with Pfizer booster 02/2020 as  of August 2022   HISTORY OF CURRENT ILLNESS: From the original intake note:  Debra Simon had routine screening mammography on 08/06/2020 showing a possible abnormality in the left breast. She underwent left diagnostic mammography with tomography and left breast ultrasonography at [facility] on 08/28/2020 showing: breast density category C; 11 mm left breast mass at 6 o'clock, retroareolar; no left axillary lymphadenopathy.  Accordingly on 09/03/2020 she proceeded to biopsy of the left breast area in question. The pathology from this procedure ([id number]) showed: invasive ductal carcinoma, grade 1-2; ductal carcinoma in situ. Prognostic indicators significant for: estrogen receptor, >95% positive and progesterone receptor, >95% positive, both with strong staining intensity. Proliferation marker Ki67 at 15%. HER2 negative by immunohistochemistry (1+).   Cancer Staging  Malignant neoplasm of lower-outer quadrant of left breast of female, estrogen receptor positive (HCC) Staging form: Breast, AJCC 8th Edition - Clinical: Stage IA (cT1c, cN0, cM0, G2, ER+, PR+, HER2-) - Signed by Lowella Dell, MD on 09/08/2020 Histologic grading system: 3 grade system   The patient's subsequent history is as detailed below.   PAST MEDICAL HISTORY: Past Medical History:  Diagnosis Date   Anxiety    Arthritis    Breast cancer (HCC)    left breast 2022   Colon polyps 2010   Diastolic dysfunction, left ventricle 06/26/2016   (ECHO 2018): Left ventricle: The cavity size was normal. Systolic function was normal. The estimated ejection fraction was in the range of 60% to 65%. Wall motion was normal; there were no regional wall motion abnormalities. Doppler parameters are consistent with abnormal left ventricular relaxation (grade 1 diastolic dysfunction).   Diverticulosis    Family history of brain cancer    Family history of  breast cancer    Family history of pancreatic cancer    Family history of  uterine cancer    GERD (gastroesophageal reflux disease)    watches what she eats   Headache    gets headaches when she eats sweets   Heart murmur    dx last yr.   had echo 05/2016   History of radiation therapy    Left breast- 11/18/20-12/17/20- Dr. Antony Blackbird   Hyperlipidemia    Hypertension    Mild mitral regurgitation 07/04/2016   Personal history of radiation therapy    Pneumonia    Pre-diabetes    Prediabetes 11/11/2020   Primary localized osteoarthritis of right knee 05/24/2017   Ureterovaginal fistula    Uterine fibroid 1994   Vaginal dysplasia     PAST SURGICAL HISTORY: Past Surgical History:  Procedure Laterality Date   BREAST CYST ASPIRATION Right    BREAST EXCISIONAL BIOPSY Right    BREAST LUMPECTOMY     BREAST LUMPECTOMY WITH RADIOACTIVE SEED AND SENTINEL LYMPH NODE BIOPSY Left 10/15/2020   Procedure: LEFT BREAST LUMPECTOMY WITH RADIOACTIVE SEED AND SENTINEL LYMPH NODE BIOPSY;  Surgeon: Almond Lint, MD;  Location: [facility] OR;  Service: General;  Laterality: Left;   COLONOSCOPY  2010   COLONOSCOPY  2012   COLONOSCOPY  2016   CYSTOSCOPY     ESOPHAGOGASTRODUODENOSCOPY  2010   TOTAL ABDOMINAL HYSTERECTOMY  1994   TOTAL KNEE ARTHROPLASTY Right 06/05/2017   TOTAL KNEE ARTHROPLASTY Right 06/05/2017   Procedure: RIGHT TOTAL KNEE ARTHROPLASTY;  Surgeon: Salvatore Marvel, MD;  Location: [facility] OR;  Service: Orthopedics;  Laterality: Right;   Ueterovaginal fistual repair     URETERAL EXPLORATION  04/16/1992   URETEROTOMY ON THE RIGHT, REPAIR LACERATION ON THE LEFT    FAMILY HISTORY: Family History  Problem Relation Age of Onset   Diabetes Mother    Hypertension Mother    Brain cancer Father        d. 45   Hypertension Sister    Diabetes Sister    Diabetes Brother    Breast cancer Paternal Aunt        dx >50   Uterine cancer Paternal Aunt        dx >50   Cancer Maternal Grandmother        unknown type, died in her 1s   Pancreatic cancer Cousin 48       maternal  first cousin   Uterine cancer Cousin        dx 37s, maternal first cousin   Breast cancer Cousin 27       paternal first cousin   Her father died at age 40 from brain cancer. Her mother is 11 years old as of 08/2020. Tishauna has two brothers and two sisters. She reports breast cancer in a paternal aunt (over age 60), uterine cancer in a paternal aunt (>50) and a maternal cousin (11's), and pancreatic cancer in a maternal cousin (age 75).   GYNECOLOGIC HISTORY:  No LMP recorded. Patient has had a hysterectomy. Menarche: 72 years old Age at first live birth: 72 years old GX P 4 LMP 02/1992, with hysterectomy Contraceptive: never used HRT never used  Hysterectomy? Yes, 02/1992 BSO? Yes?   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 Husband Marcello Moores works in [employer], now part time. She lives at home with her husband. Their daughter Jayme Cloud, age 69, is a Engineer, site at [employer] here  in [place]. Son Valentina Lucks, age 57, works in transit at a train station in [place]. Son Margurite Auerbach, age 21, is a Paramedic here in [place]. Son Domingo Cocking, age 49, is a Paramedic in [place]. She attends a [employer].    ADVANCED DIRECTIVES: not in place. In the absence of any documentation to the contrary, the patient's spouse is their HCPOA. She intends to name her son Margurite Auerbach as her HCPOA.   HEALTH MAINTENANCE: Social History   Tobacco Use   Smoking status: Never   Smokeless tobacco: Never  Vaping Use   Vaping status: Never Used  Substance Use Topics   Alcohol use: No   Drug use: No     Colonoscopy: 01/2020 (Dr. Myrtie Neither), recall 2026  PAP: 05/2020, negative  Bone density: 03/2017, -0.1   No Known Allergies  Current Outpatient Medications  Medication Sig Dispense Refill   anastrozole (ARIMIDEX) 1 MG tablet TAKE 1 TABLET (1 MG TOTAL) BY MOUTH DAILY. START December 26, 2020 90 tablet 4   aspirin EC  81 MG tablet Take 1 tablet (81 mg total) by mouth daily.     cephALEXin (KEFLEX) 500 MG capsule Take 1 capsule (500 mg total) by mouth 2 (two) times daily for 14 doses. 14 capsule 0   Cranberry 50 MG CHEW Chew by mouth.     KLOR-CON M20 20 MEQ tablet TAKE 1 TABLET BY MOUTH EVERY DAY 90 tablet 3   Lactobacillus (PROBIOTIC ACIDOPHILUS PO) Take by mouth.     losartan-hydrochlorothiazide (HYZAAR) 100-12.5 MG tablet Take 1 tablet by mouth daily. 90 tablet 3   methenamine (HIPREX) 1 g tablet Take 1 g by mouth 2 (two) times daily with a meal.     simvastatin (ZOCOR) 20 MG tablet TAKE 1 TABLET BY MOUTH EVERYDAY AT BEDTIME 90 tablet 3   No current facility-administered medications for this visit.    OBJECTIVE: Spanish speaker who appears stated age  Vitals:   02/08/23 1345  BP: 137/75  Pulse: 83  Resp: 16  Temp: 97.7 F (36.5 C)  SpO2: 99%       Body mass index is 29.93 kg/m.   Wt Readings from Last 3 Encounters:  02/08/23 148 lb 3.2 oz (67.2 kg)  02/06/23 149 lb 6.4 oz (67.8 kg)  01/02/23 151 lb 8 oz (68.7 kg)      ECOG FS:1 - Symptomatic but completely ambulatory  Physical Exam Chest:     Comments: Bilateral breasts inspected. No palpable masses or regional adenopathy changes.  Left axilla with postsurgical changes. Musculoskeletal:     Cervical back: Normal range of motion and neck supple.  Lymphadenopathy:     Cervical: No cervical adenopathy.  Neurological:     Mental Status: She is alert.       LAB RESULTS:  CMP     Component Value Date/Time   NA 135 11/24/2022 1145   K 3.5 11/24/2022 1145   CL 97 11/24/2022 1145   CO2 29 11/24/2022 1145   GLUCOSE 97 11/24/2022 1145   BUN 12 11/24/2022 1145   CREATININE 0.57 11/24/2022 1145   CREATININE 0.61 02/07/2022 1259   CREATININE 0.57 06/01/2016 1018   CALCIUM 10.0 11/24/2022 1145   CALCIUM 9.0 09/11/2012 0000   PROT 7.2 08/10/2022 1048   ALBUMIN 4.5 08/10/2022 1048   AST 18 08/10/2022 1048   AST 17 02/07/2022  1259   ALT 12 08/10/2022 1048   ALT 14 02/07/2022 1259   ALKPHOS 71 08/10/2022 1048  ALKPHOS 78 09/11/2012 0000   BILITOT 0.7 08/10/2022 1048   BILITOT 0.6 02/07/2022 1259   GFRNONAA >60 02/07/2022 1259   GFRAA >60 06/06/2017 0404    No results found for: "TOTALPROTELP", "ALBUMINELP", "A1GS", "A2GS", "BETS", "BETA2SER", "GAMS", "MSPIKE", "SPEI"  Lab Results  Component Value Date   WBC 4.1 08/10/2022   NEUTROABS 2.4 08/10/2022   HGB 12.6 08/10/2022   HCT 37.3 08/10/2022   MCV 88.5 08/10/2022   PLT 226.0 08/10/2022    No results found for: "LABCA2"  No components found for: "ZOXWRU045"  No results for input(s): "INR" in the last 168 hours.  No results found for: "LABCA2"  No results found for: "WUJ811"  No results found for: "CAN125"  No results found for: "CAN153"  No results found for: "CA2729"  No components found for: "HGQUANT"  No results found for: "CEA1", "CEA" / No results found for: "CEA1", "CEA"   No results found for: "AFPTUMOR"  No results found for: "CHROMOGRNA"  No results found for: "KPAFRELGTCHN", "LAMBDASER", "KAPLAMBRATIO" (kappa/lambda light chains)  No results found for: "HGBA", "HGBA2QUANT", "HGBFQUANT", "HGBSQUAN" (Hemoglobinopathy evaluation)   No results found for: "LDH"  No results found for: "IRON", "TIBC", "IRONPCTSAT" (Iron and TIBC)  No results found for: "FERRITIN"  Urinalysis    Component Value Date/Time   COLORURINE YELLOW 06/10/2022 1123   APPEARANCEUR CLEAR 06/10/2022 1123   LABSPEC 1.015 06/10/2022 1123   PHURINE 8.0 06/10/2022 1123   GLUCOSEU NEGATIVE 06/10/2022 1123   HGBUR SMALL (A) 06/10/2022 1123   BILIRUBINUR negative 02/06/2023 1501   KETONESUR TRACE (A) 06/10/2022 1123   PROTEINUR Positive (A) 02/06/2023 1501   PROTEINUR NEGATIVE 01/09/2019 1117   UROBILINOGEN 0.2 02/06/2023 1501   UROBILINOGEN 0.2 06/10/2022 1123   NITRITE negative 02/06/2023 1501   NITRITE POSITIVE (A) 06/10/2022 1123    LEUKOCYTESUR Large (3+) (A) 02/06/2023 1501   LEUKOCYTESUR TRACE (A) 06/10/2022 1123    STUDIES: No results found.   ELIGIBLE FOR AVAILABLE RESEARCH PROTOCOL: No  ASSESSMENT: 72 y.o. [place] woman s/p Left breast lower outer quadrant biopsy 09/03/2020 for a clinical T1c N0, stage IA invasive ductal carcinoma, grade I-II, estrogen and progesterone receptor positive, HER2 not amplified, with an Mib-1 of 15%  (1) genetics testing through the [facility] BRCAplus panel (report date 09/15/2020) or the CancerNext-Expanded + RNAinsight panel (report date 09/19/2020) found no deleterious mutations in ATM, BRCA1, BRCA2, CDH1, CHEK2, PALB2, PTEN, and TP53. The CancerNext-Expanded + RNAinsight gene panel offered by [employer] and includes sequencing and rearrangement analysis for the following 77 genes: AIP, ALK, APC, ATM, AXIN2, BAP1, BARD1, BLM, BMPR1A, BRCA1, BRCA2, BRIP1, CDC73, CDH1, CDK4, CDKN1B, CDKN2A, CHEK2, CTNNA1, DICER1, FANCC, FH, FLCN, GALNT12, KIF1B, LZTR1, MAX, MEN1, MET, MLH1, MSH2, MSH3, MSH6, MUTYH, NBN, NF1, NF2, NTHL1, PALB2, PHOX2B, PMS2, POT1, PRKAR1A, PTCH1, PTEN, RAD51C, RAD51D, RB1, RECQL, RET, SDHA, SDHAF2, SDHB, SDHC, SDHD, SMAD4, SMARCA4, SMARCB1, SMARCE1, STK11, SUFU, TMEM127, TP53, TSC1, TSC2, VHL and XRCC2 (sequencing and deletion/duplication); EGFR, EGLN1, HOXB13, KIT, MITF, PDGFRA, POLD1 and POLE (sequencing only); EPCAM and GREM1 (deletion/duplication only). RNA data is routinely analyzed for use in variant interpretation for all genes.  (2) status post left lumpectomy and sentinel lymph node sampling 10/15/2020 for a pT1c pN0, stage IA invasive ductal carcinoma, grade 2, with negative margins (A) a total of 5 left axillary lymph nodes were removed  (3) Oncotype score of 15 predicts a risk of recurrence outside the breast in the next 9 years of 4% if the patient's only systemic  therapy is antiestrogens for 5 years.  It also predicts no benefit from chemotherapy  (4) adjuvant  radiation 11/18/2020 through 12/17/2020 Site Technique Total Dose (Gy) Dose per Fx (Gy) Completed Fx Beam Energies  Breast, Left: Breast_Lt 3D 40.05/40.05 2.67 15/15 6XFFF  Breast, Left: Breast_Lt_Bst 3D 12/12 2 6/6 6X    (5) started anastrozole 12/26/2020 (A) [facility] at [facility] 04/18/2017 was normal   PLAN: Breast Cancer on Anastrozole Reports dry skin and hair, and recurrent urinary tract infections. Discussed potential indirect relationship between Anastrozole and urinary tract infections due to vaginal dryness. Also discussed potential switch to Tamoxifen however patient reluctant. -Continue Anastrozole and monitor symptoms. Continue Hiprex, urinary antiseptic, according to pt, this has been helping -Consider switch to Tamoxifen if urinary tract infections persist.  Urinary Tract Infections Recurrent infections, currently on Hiprex 500mg  twice daily for prevention. Recent infection treated with Keflex. -Continue Hiprex for prevention. -Consider daily antibiotic for prevention if infections persist. Pt reluctant. -Advise on hygiene measures during travel to prevent infections.  General Health Maintenance -Annual blood work completed in May 2024 at Dr. Lavinia Sharps office. Next blood work due in May 2025. -Next follow-up visit in one year (November 2025).  Total time spent: 30 min  *Total Encounter Time as defined by the Centers for Medicare and Medicaid Services includes, in addition to the face-to-face time of a patient visit (documented in the note above) non-face-to-face time: obtaining and reviewing outside history, ordering and reviewing medications, tests or procedures, care coordination (communications with other health care professionals or caregivers) and documentation in the medical record.

## 2023-02-10 ENCOUNTER — Ambulatory Visit (INDEPENDENT_AMBULATORY_CARE_PROVIDER_SITE_OTHER): Payer: Medicare HMO | Admitting: Family Medicine

## 2023-02-10 ENCOUNTER — Encounter: Payer: Self-pay | Admitting: Family Medicine

## 2023-02-10 VITALS — BP 120/70 | HR 71 | Temp 98.0°F | Ht 59.0 in | Wt 147.2 lb

## 2023-02-10 DIAGNOSIS — N390 Urinary tract infection, site not specified: Secondary | ICD-10-CM

## 2023-02-10 DIAGNOSIS — E876 Hypokalemia: Secondary | ICD-10-CM | POA: Diagnosis not present

## 2023-02-10 DIAGNOSIS — L818 Other specified disorders of pigmentation: Secondary | ICD-10-CM | POA: Diagnosis not present

## 2023-02-10 DIAGNOSIS — T502X5A Adverse effect of carbonic-anhydrase inhibitors, benzothiadiazides and other diuretics, initial encounter: Secondary | ICD-10-CM | POA: Diagnosis not present

## 2023-02-10 DIAGNOSIS — Z23 Encounter for immunization: Secondary | ICD-10-CM

## 2023-02-10 DIAGNOSIS — I10 Essential (primary) hypertension: Secondary | ICD-10-CM

## 2023-02-10 NOTE — Patient Instructions (Addendum)
Please return in 6 months for your annual complete physical; please come fasting.    If you have any questions or concerns, please don't hesitate to send me a message via MyChart or call the office at [phone]. Thank you for visiting with [place] today! It's our pleasure caring for you.   VISIT SUMMARY:  During today's visit, we discussed your recent recurrence of urinary tract infection (UTI) symptoms, which appeared after a five-month symptom-free period. We also addressed your constipation issues during travel and a skin condition with white spots. Your general health, including recent lab results, was reviewed and found to be normal.  YOUR PLAN:  -RECURRENT URINARY TRACT INFECTIONS (UTIS): Recurrent UTIs are repeated infections of the urinary tract. Your recent UTI occurred after a five-month break, likely due to changes in diet and fluid intake during travel. You should complete your current course of antibiotics and continue with the preventive medication as prescribed. Monitor for any frequent infections and report them if they occur.  -CONSTIPATION: Constipation is difficulty in passing stools, which can contribute to UTIs. This issue seems to be related to changes in diet, fluid intake, and stress during travel. To manage this, increase your fluid intake, maintain a high-fiber diet, and consider using a mild laxative if constipation persists during travel.  -GENERAL HEALTH MAINTENANCE: Your overall health is good, with well-controlled blood pressure and normal results from recent vascular studies, potassium levels, and kidney/liver function tests. Continue your current potassium supplementation and schedule your next physical examination in May. Please call if you have any new symptoms or concerns.  INSTRUCTIONS:  Complete your current course of antibiotics for the UTI. Continue taking your preventive medication as prescribed. Increase your fluid intake and maintain a high-fiber diet during  travel to manage constipation. Consider using a mild laxative if needed. Schedule your next physical examination in May and call if you experience any new symptoms or have concerns.

## 2023-02-10 NOTE — Progress Notes (Signed)
Subjective  CC:  Chief Complaint  Patient presents with   Hypertension    HPI: Debra Simon is a 72 y.o. female who presents to the office today to address the problems listed above in the chief complaint. Hypertension f/u: Control is good . Pt reports she is doing well. taking medications as instructed, no medication side effects noted, no TIAs, no chest pain on exertion, no dyspnea on exertion, no swelling of ankles. She denies adverse effects from his BP medications. Compliance with medication is good.  Discussed the use of AI scribe software for clinical note transcription with the patient, who gave verbal consent to proceed. Discussed recent pansensitive e.coli uti on keflex and improving, HTN f/u, nl ABI's, repleted hypokalemia and skin condition: History of Present Illness   The patient, with a history of urinary tract infections (UTIs), presents with a recurrence of UTI symptoms. She reports that the infection returned after a period of five months without symptoms, during which she was taking preventive medication prescribed by a urologist. The patient notes that she often gets sick when she travels, first experiencing constipation followed by a UTI. She recently traveled for three weeks to [place], during which she changed her diet from primarily vegetables to other foods. The patient also reports a skin condition characterized by white spots all over her body, including her legs. She has noticed this condition in other people as well. The patient is scheduled to travel to [place] in December. She has been taking potassium supplements and has had normal results on recent vascular studies. The patient's potassium levels are within the desired range, and her liver and kidney function are reported as normal.     Assessment  1. Essential hypertension   2. Need for immunization against influenza   3. Recurrent UTI (urinary tract infection)   4. Idiopathic guttate hypomelanosis    5. Diuretic-induced hypokalemia      Plan  Assessment and Plan    Recurrent Urinary Tract Infections (UTIs) Recurrent UTIs, most recent episode after a five-month interval. Preventive medication effective. UTIs associated with travel, likely due to constipation and dietary/fluid intake changes. Recently traveled to [place] with dietary changes. Informed consent provided for continuation of preventive medication and completion of antibiotics. Further evaluation if infections become frequent. - Complete current course of antibiotics, keflex for pansensitive e.coli uti, first in 5 months - Continue preventive methamine as prescribed - Monitor for recurrent infections and report if frequent  Constipation Constipation during travel, contributing to UTIs. Likely due to dietary, fluid intake changes, and stress. Informed consent provided for increasing fluid intake, maintaining high-fiber diet, and considering mild laxatives during travel. - Increase fluid intake during travel - Maintain high-fiber diet during travel - Consider mild laxative if constipation persists during travel  HTN - well controlled w/ arb/hydrochlorothiazide and potassium supplements  IGH -benign skin condition, reassured  Flu shot updated General Health Maintenance Overall health good. Blood pressure well-controlled. Recent vascular studies, potassium levels, and kidney/liver function tests normal. - Continue current potassium supplementation - Schedule next physical examination in May - Call if new symptoms or concerns arise.       Orders Placed This Encounter  Procedures   Flu Vaccine Trivalent High Dose (Fluad)   No orders of the defined types were placed in this encounter.     BP Readings from Last 3 Encounters:  02/10/23 120/70  02/08/23 137/75  02/06/23 138/76   Wt Readings from Last 3 Encounters:  02/10/23 147  lb 4 oz (66.8 kg)  02/08/23 148 lb 3.2 oz (67.2 kg)  02/06/23 149 lb 6.4 oz (67.8  kg)    Lab Results  Component Value Date   CHOL 130 08/10/2022   CHOL 126 08/06/2021   CHOL 102 07/08/2020   Lab Results  Component Value Date   HDL 51.50 08/10/2022   HDL 47.90 08/06/2021   HDL 44.50 07/08/2020   Lab Results  Component Value Date   LDLCALC 61 08/10/2022   LDLCALC 61 08/06/2021   LDLCALC 42 07/08/2020   Lab Results  Component Value Date   TRIG 88.0 08/10/2022   TRIG 85.0 08/06/2021   TRIG 80.0 07/08/2020   Lab Results  Component Value Date   CHOLHDL 3 08/10/2022   CHOLHDL 3 08/06/2021   CHOLHDL 2 07/08/2020   No results found for: "LDLDIRECT" Lab Results  Component Value Date   CREATININE 0.57 11/24/2022   BUN 12 11/24/2022   NA 135 11/24/2022   K 3.5 11/24/2022   CL 97 11/24/2022   CO2 29 11/24/2022    The 10-year ASCVD risk score (Arnett DK, et al., 2019) is: 11.2%   Values used to calculate the score:     Age: 66 years     Sex: Female     Is Non-Hispanic African American: No     Diabetic: No     Tobacco smoker: No     Systolic Blood Pressure: 120 mmHg     Is BP treated: Yes     HDL Cholesterol: 51.5 mg/dL     Total Cholesterol: 130 mg/dL  I reviewed the patients updated PMH, FH, and SocHx.    Patient Active Problem List   Diagnosis Date Noted   Prediabetes 11/11/2020    Priority: High   Malignant neoplasm of lower-outer quadrant of left breast of female, estrogen receptor positive (HCC) 09/07/2020    Priority: High   Serrated adenoma of colon 09/27/2019    Priority: High   Mixed hyperlipidemia 06/02/2016    Priority: High   Essential hypertension 07/31/2013    Priority: High   Blindness right eye category 3, blindness left eye category 3 09/09/2020    Priority: Medium    Umbilical hernia without obstruction and without gangrene 07/08/2020    Priority: Medium    History of lacunar cerebrovascular accident (CVA) 09/27/2019    Priority: Medium    Bilateral primary osteoarthritis of knee 06/05/2017    Priority: Medium     History of vaginal dysplasia 08/31/2016    Priority: Medium    Systolic murmur 08/06/2021    Priority: Low   Varicose veins of both lower extremities with pain 07/08/2020    Priority: Low   Diverticulosis     Priority: Low   Nonrheumatic mitral valve regurgitation 07/04/2016    Priority: Low   Sensorineural hearing loss (SNHL), bilateral 07/07/2015    Priority: Low   Rectocele 05/30/2014    Priority: Low   Recurrent UTI (urinary tract infection) 02/10/2023   Diuretic-induced hypokalemia 02/10/2023   Idiopathic guttate hypomelanosis 02/10/2023   Genetic testing 09/16/2020   Family history of breast cancer 09/10/2020   Family history of brain cancer 09/10/2020   Family history of uterine cancer 09/10/2020   Family history of pancreatic cancer 09/10/2020    Allergies: Patient has no known allergies.  Social History: Patient  reports that she has never smoked. She has never used smokeless tobacco. She reports that she does not drink alcohol and does not use drugs.  Current Meds  Medication Sig   anastrozole (ARIMIDEX) 1 MG tablet TAKE 1 TABLET (1 MG TOTAL) BY MOUTH DAILY. START December 26, 2020   aspirin EC 81 MG tablet Take 1 tablet (81 mg total) by mouth daily.   cephALEXin (KEFLEX) 500 MG capsule Take 1 capsule (500 mg total) by mouth 2 (two) times daily for 14 doses.   Cranberry 50 MG CHEW Chew by mouth.   KLOR-CON M20 20 MEQ tablet TAKE 1 TABLET BY MOUTH EVERY DAY   Lactobacillus (PROBIOTIC ACIDOPHILUS PO) Take by mouth.   losartan-hydrochlorothiazide (HYZAAR) 100-12.5 MG tablet Take 1 tablet by mouth daily.   methenamine (HIPREX) 1 g tablet Take 1 g by mouth 2 (two) times daily with a meal.   simvastatin (ZOCOR) 20 MG tablet TAKE 1 TABLET BY MOUTH EVERYDAY AT BEDTIME    Review of Systems: Cardiovascular: negative for chest pain, palpitations, leg swelling, orthopnea Respiratory: negative for SOB, wheezing or persistent cough Gastrointestinal: negative for abdominal  pain Genitourinary: negative for dysuria or gross hematuria  Objective  Vitals: BP 120/70 (BP Location: Right Arm, Patient Position: Sitting, Cuff Size: Normal)   Pulse 71   Temp 98 F (36.7 C) (Temporal)   Ht 4\' 11"  (1.499 m)   Wt 147 lb 4 oz (66.8 kg)   SpO2 98%   BMI 29.74 kg/m  General: no acute distress  Psych:  Alert and oriented, normal mood and affect HEENT:  Normocephalic, atraumatic, supple neck  Cardiovascular:  RRR without murmur. no edema Respiratory:  Good breath sounds bilaterally, CTAB with normal respiratory effort Skin:  Warm, diffuse small hypopigmented macules on arms and legs Neurologic:   Mental status is normal Commons side effects, risks, benefits, and alternatives for medications and treatment plan prescribed today were discussed, and the patient expressed understanding of the given instructions. Patient is instructed to call or message via MyChart if he/she has any questions or concerns regarding our treatment plan. No barriers to understanding were identified. We discussed Red Flag symptoms and signs in detail. Patient expressed understanding regarding what to do in case of urgent or emergency type symptoms.  Medication list was reconciled, printed and provided to the patient in AVS. Patient instructions and summary information was reviewed with the patient as documented in the AVS. This note was prepared with assistance of Dragon voice recognition software. Occasional wrong-word or sound-a-like substitutions may have occurred due to the inherent limitation

## 2023-03-16 DIAGNOSIS — N3020 Other chronic cystitis without hematuria: Secondary | ICD-10-CM | POA: Diagnosis not present

## 2023-05-30 ENCOUNTER — Ambulatory Visit: Payer: Medicare HMO | Admitting: Family Medicine

## 2023-07-07 DIAGNOSIS — N3020 Other chronic cystitis without hematuria: Secondary | ICD-10-CM | POA: Diagnosis not present

## 2023-07-10 ENCOUNTER — Ambulatory Visit: Payer: Medicare HMO | Attending: General Surgery

## 2023-07-10 VITALS — Wt 147.0 lb

## 2023-07-10 DIAGNOSIS — Z483 Aftercare following surgery for neoplasm: Secondary | ICD-10-CM | POA: Insufficient documentation

## 2023-07-10 NOTE — Therapy (Signed)
 OUTPATIENT PHYSICAL THERAPY SOZO SCREENING NOTE   Patient Name: Debra Simon MRN: [id number] DOB:1951-01-11, 73 y.o., female Today's Date: 07/10/2023  PCP: Luevenia Saha, MD REFERRING PROVIDER: Lockie Rima, MD   PT End of Session - 07/10/23 1005     Visit Number 1   # unchnaged due to screen only   PT Start Time 1003    PT Stop Time 1007    PT Time Calculation (min) 4 min    Activity Tolerance Patient tolerated treatment well    Behavior During Therapy [facility] for tasks assessed/performed             Past Medical History:  Diagnosis Date   Anxiety    Arthritis    Breast cancer (HCC)    left breast 2022   Colon polyps 2010   Diastolic dysfunction, left ventricle 06/26/2016   (ECHO 2018): Left ventricle: The cavity size was normal. Systolic function was normal. The estimated ejection fraction was in the range of 60% to 65%. Wall motion was normal; there were no regional wall motion abnormalities. Doppler parameters are consistent with abnormal left ventricular relaxation (grade 1 diastolic dysfunction).   Diverticulosis    Family history of brain cancer    Family history of breast cancer    Family history of pancreatic cancer    Family history of uterine cancer    GERD (gastroesophageal reflux disease)    watches what she eats   Headache    gets headaches when she eats sweets   Heart murmur    dx last yr.   had echo 05/2016   History of radiation therapy    Left breast- 11/18/20-12/17/20- Dr. Retta Caster   Hyperlipidemia    Hypertension    Mild mitral regurgitation 07/04/2016   Personal history of radiation therapy    Pneumonia    Pre-diabetes    Prediabetes 11/11/2020   Primary localized osteoarthritis of right knee 05/24/2017   Ureterovaginal fistula    Uterine fibroid 1994   Vaginal dysplasia    Past Surgical History:  Procedure Laterality Date   BREAST CYST ASPIRATION Right    BREAST EXCISIONAL BIOPSY Right    BREAST LUMPECTOMY     BREAST LUMPECTOMY  WITH RADIOACTIVE SEED AND SENTINEL LYMPH NODE BIOPSY Left 10/15/2020   Procedure: LEFT BREAST LUMPECTOMY WITH RADIOACTIVE SEED AND SENTINEL LYMPH NODE BIOPSY;  Surgeon: Lockie Rima, MD;  Location: [facility] OR;  Service: General;  Laterality: Left;   COLONOSCOPY  2010   COLONOSCOPY  2012   COLONOSCOPY  2016   CYSTOSCOPY     ESOPHAGOGASTRODUODENOSCOPY  2010   TOTAL ABDOMINAL HYSTERECTOMY  1994   TOTAL KNEE ARTHROPLASTY Right 06/05/2017   TOTAL KNEE ARTHROPLASTY Right 06/05/2017   Procedure: RIGHT TOTAL KNEE ARTHROPLASTY;  Surgeon: Elly Habermann, MD;  Location: [facility] OR;  Service: Orthopedics;  Laterality: Right;   Ueterovaginal fistual repair     URETERAL EXPLORATION  04/16/1992   URETEROTOMY ON THE RIGHT, REPAIR LACERATION ON THE LEFT   Patient Active Problem List   Diagnosis Date Noted   Recurrent UTI (urinary tract infection) 02/10/2023   Diuretic-induced hypokalemia 02/10/2023   Idiopathic guttate hypomelanosis 02/10/2023   Systolic murmur 08/06/2021   Prediabetes 11/11/2020   Genetic testing 09/16/2020   Family history of breast cancer 09/10/2020   Family history of brain cancer 09/10/2020   Family history of uterine cancer 09/10/2020   Family history of pancreatic cancer 09/10/2020   Blindness right eye category 3, blindness left eye category 3 09/09/2020  Malignant neoplasm of lower-outer quadrant of left breast of female, estrogen receptor positive (HCC) 09/07/2020   Varicose veins of both lower extremities with pain 07/08/2020   Umbilical hernia without obstruction and without gangrene 07/08/2020   History of lacunar cerebrovascular accident (CVA) 09/27/2019   Serrated adenoma of colon 09/27/2019   Bilateral primary osteoarthritis of knee 06/05/2017   Diverticulosis    History of vaginal dysplasia 08/31/2016   Nonrheumatic mitral valve regurgitation 07/04/2016   Mixed hyperlipidemia 06/02/2016   Sensorineural hearing loss (SNHL), bilateral 07/07/2015   Rectocele 05/30/2014    Essential hypertension 07/31/2013    REFERRING DIAG: left breast cancer at risk for lymphedema  THERAPY DIAG: Aftercare following surgery for neoplasm  PERTINENT HISTORY: Patient was diagnosed on 08/28/2020 with left grade I-II invasive ductal carcinoma breast cancer. She had a left lumpectomy and sentinel node biopsy on 10/15/2020 with 5 negative axillary nodes removed. It is ER/PR positive and HER2 negative with a Ki67 of 15%.   PRECAUTIONS: left UE Lymphedema risk, None  SUBJECTIVE: Pt returns for her 6 month L-Dex screen.   PAIN:  Are you having pain? No  SOZO SCREENING: Patient was assessed today using the SOZO machine to determine the lymphedema index score. This was compared to her baseline score. It was determined that she is within the recommended range when compared to her baseline and no further action is needed at this time. She will continue SOZO screenings. These are done every 3 months for 2 years post operatively followed by every 6 months for 2 years, and then annually.    L-DEX FLOWSHEETS - 07/10/23 1000       L-DEX LYMPHEDEMA SCREENING   Measurement Type Unilateral    L-DEX MEASUREMENT EXTREMITY Upper Extremity    POSITION  Standing    DOMINANT SIDE Right    At Risk Side Left    BASELINE SCORE (UNILATERAL) 2.3    L-DEX SCORE (UNILATERAL) 1.3    VALUE CHANGE (UNILAT) -1               Denyce Flank, PTA 07/10/2023, 10:06 AM

## 2023-07-25 ENCOUNTER — Ambulatory Visit (INDEPENDENT_AMBULATORY_CARE_PROVIDER_SITE_OTHER): Admitting: Obstetrics and Gynecology

## 2023-07-25 ENCOUNTER — Encounter: Payer: Self-pay | Admitting: Obstetrics and Gynecology

## 2023-07-25 ENCOUNTER — Other Ambulatory Visit (HOSPITAL_COMMUNITY)
Admission: RE | Admit: 2023-07-25 | Discharge: 2023-07-25 | Disposition: A | Discharge status: Home / Self Care | Source: Ambulatory Visit | Attending: Obstetrics and Gynecology | Admitting: Obstetrics and Gynecology

## 2023-07-25 VITALS — BP 118/80 | HR 72 | Temp 98.4°F | Ht 59.75 in | Wt 148.0 lb

## 2023-07-25 DIAGNOSIS — C50512 Malignant neoplasm of lower-outer quadrant of left female breast: Secondary | ICD-10-CM | POA: Diagnosis not present

## 2023-07-25 DIAGNOSIS — Z853 Personal history of malignant neoplasm of breast: Secondary | ICD-10-CM | POA: Insufficient documentation

## 2023-07-25 DIAGNOSIS — Z90710 Acquired absence of both cervix and uterus: Secondary | ICD-10-CM | POA: Diagnosis not present

## 2023-07-25 DIAGNOSIS — Z01419 Encounter for gynecological examination (general) (routine) without abnormal findings: Secondary | ICD-10-CM | POA: Insufficient documentation

## 2023-07-25 DIAGNOSIS — Z1272 Encounter for screening for malignant neoplasm of vagina: Secondary | ICD-10-CM | POA: Insufficient documentation

## 2023-07-25 DIAGNOSIS — Z9189 Other specified personal risk factors, not elsewhere classified: Secondary | ICD-10-CM

## 2023-07-25 DIAGNOSIS — Z87411 Personal history of vaginal dysplasia: Secondary | ICD-10-CM | POA: Diagnosis not present

## 2023-07-25 DIAGNOSIS — Z1151 Encounter for screening for human papillomavirus (HPV): Secondary | ICD-10-CM | POA: Insufficient documentation

## 2023-07-25 DIAGNOSIS — Z170 Estrogen receptor positive status [ER+]: Secondary | ICD-10-CM | POA: Diagnosis not present

## 2023-07-25 DIAGNOSIS — N890 Mild vaginal dysplasia: Secondary | ICD-10-CM | POA: Diagnosis not present

## 2023-07-25 DIAGNOSIS — Z9289 Personal history of other medical treatment: Secondary | ICD-10-CM | POA: Diagnosis not present

## 2023-07-25 NOTE — Patient Instructions (Signed)

## 2023-07-25 NOTE — Assessment & Plan Note (Signed)
 Cervical cancer screening performed according to ASCCP guidelines. Encouraged annual mammogram screening Colonoscopy UTD DXA UTD Labs and immunizations with her primary Encouraged safe sexual practices as indicated Encouraged healthy lifestyle practices with diet and exercise For patients over 72yo, I recommend 1200mg  calcium daily and 800IU of vitamin D daily.

## 2023-07-25 NOTE — Progress Notes (Signed)
 73 y.o. [id number] postmenopausal female with Left Breast Ca Dx in 07/2020, on Anastrozole , s/p hysterectomy, H/O VAIN 1, last Colpo 06/2018 here for annual exam-high risk Medicare. Married, separated. Due to language barrier, an interpreter was present during the history-taking and subsequent discussion (and for part of the physical exam) with this patient.  Seeing urology for recurrent UTI.  Took preventative medication for few months with improvement.  Is no longer taking.  Denies bladder symptoms today level of  Postmenopausal bleeding: none Pelvic discharge or pain: none Breast mass, nipple discharge or skin changes : none Last PAP: 2022 NIL Last mammogram: 08/19/2022 BI-RADS 2, density B Last DXA: 08/17/2021 normal Last colonoscopy: 02/14/2020 Sexually active: no  Exercising: Yes. Walking, weights  Smoker:no  Flowsheet Row Office Visit from 02/06/2023 in [facility] Total Score 0       Flowsheet Row Office Visit from 09/27/2019 in [facility]  PHQ-9 Total Score 2       GYN HISTORY: Prior hysterectomy  OB History  Gravida Para Term Preterm AB Living  7 4   3 4   SAB IAB Ectopic Multiple Live Births  3        # Outcome Date GA Lbr Len/2nd Weight Sex Type Anes PTL Lv  7 SAB           6 SAB           5 SAB           4 Para           3 Para           2 Para           1 Para             Past Medical History:  Diagnosis Date   Anxiety    Arthritis    Breast cancer (HCC)    left breast 2022   Colon polyps 2010   Diastolic dysfunction, left ventricle 06/26/2016   (ECHO 2018): Left ventricle: The cavity size was normal. Systolic function was normal. The estimated ejection fraction was in the range of 60% to 65%. Wall motion was normal; there were no regional wall motion abnormalities. Doppler parameters are consistent with abnormal left ventricular relaxation (grade 1 diastolic  dysfunction).   Diverticulosis    Family history of brain cancer    Family history of breast cancer    Family history of pancreatic cancer    Family history of uterine cancer    GERD (gastroesophageal reflux disease)    watches what she eats   Headache    gets headaches when she eats sweets   Heart murmur    dx last yr.   had echo 05/2016   History of radiation therapy    Left breast- 11/18/20-12/17/20- Dr. Retta Caster   Hyperlipidemia    Hypertension    Mild mitral regurgitation 07/04/2016   Personal history of radiation therapy    Pneumonia    Pre-diabetes    Prediabetes 11/11/2020   Primary localized osteoarthritis of right knee 05/24/2017   Ureterovaginal fistula    Uterine fibroid 1994   Vaginal dysplasia     Past Surgical History:  Procedure Laterality Date   BREAST CYST ASPIRATION Right    BREAST EXCISIONAL BIOPSY Right    BREAST LUMPECTOMY     BREAST LUMPECTOMY WITH RADIOACTIVE SEED AND SENTINEL LYMPH NODE BIOPSY Left 10/15/2020  Procedure: LEFT BREAST LUMPECTOMY WITH RADIOACTIVE SEED AND SENTINEL LYMPH NODE BIOPSY;  Surgeon: Lockie Rima, MD;  Location: [facility] OR;  Service: General;  Laterality: Left;   COLONOSCOPY  2010   COLONOSCOPY  2012   COLONOSCOPY  2016   CYSTOSCOPY     ESOPHAGOGASTRODUODENOSCOPY  2010   TOTAL ABDOMINAL HYSTERECTOMY  1994   TOTAL KNEE ARTHROPLASTY Right 06/05/2017   TOTAL KNEE ARTHROPLASTY Right 06/05/2017   Procedure: RIGHT TOTAL KNEE ARTHROPLASTY;  Surgeon: Elly Habermann, MD;  Location: [facility] OR;  Service: Orthopedics;  Laterality: Right;   Ueterovaginal fistual repair     URETERAL EXPLORATION  04/16/1992   URETEROTOMY ON THE RIGHT, REPAIR LACERATION ON THE LEFT    Current Outpatient Medications on File Prior to Visit  Medication Sig Dispense Refill   anastrozole  (ARIMIDEX ) 1 MG tablet TAKE 1 TABLET (1 MG TOTAL) BY MOUTH DAILY. START December 26, 2020 90 tablet 4   aspirin  EC 81 MG tablet Take 1 tablet (81 mg total) by mouth daily.  (Patient taking differently: Take 81 mg by mouth.)     Cranberry 50 MG CHEW Chew by mouth.     KLOR-CON  M20 20 MEQ tablet TAKE 1 TABLET BY MOUTH EVERY DAY 90 tablet 3   Lactobacillus (PROBIOTIC ACIDOPHILUS PO) Take by mouth.     losartan -hydrochlorothiazide  (HYZAAR) 100-12.5 MG tablet Take 1 tablet by mouth daily. 90 tablet 3   simvastatin  (ZOCOR ) 20 MG tablet TAKE 1 TABLET BY MOUTH EVERYDAY AT BEDTIME 90 tablet 3   No current facility-administered medications on file prior to visit.    Social History   Socioeconomic History   Marital status: Married    Spouse name: Not on file   Number of children: Not on file   Years of education: Not on file   Highest education level: GED or equivalent  Occupational History   Occupation: Housewife  Tobacco Use   Smoking status: Never   Smokeless tobacco: Never  Vaping Use   Vaping status: Never Used  Substance and Sexual Activity   Alcohol use: No   Drug use: No   Sexual activity: Not Currently    Partners: Male    Birth control/protection: Surgical    Comment: hysterectomy, older than 16, less than 5  Other Topics Concern   Not on file  Social History Narrative   Not on file   Social Drivers of Health   Financial Resource Strain: Low Risk  (12/12/2022)   Overall Financial Resource Strain (CARDIA)    Difficulty of Paying Living Expenses: Not hard at all  Food Insecurity: No Food Insecurity (12/12/2022)   Hunger Vital Sign    Worried About Running Out of Food in the Last Year: Never true    Ran Out of Food in the Last Year: Never true  Transportation Needs: No Transportation Needs (12/12/2022)   PRAPARE - Administrator, Civil Service (Medical): No    Lack of Transportation (Non-Medical): No  Physical Activity: Sufficiently Active (12/12/2022)   Exercise Vital Sign    Days of Exercise per Week: 5 days    Minutes of Exercise per Session: 30 min  Stress: No Stress Concern Present (12/12/2022)   [employer] of  Occupational Health - Occupational Stress Questionnaire    Feeling of Stress : Not at all  Social Connections: Moderately Integrated (12/12/2022)   Social Connection and Isolation Panel [NHANES]    Frequency of Communication with Friends and Family: More than three times a week  Frequency of Social Gatherings with Friends and Family: More than three times a week    Attends Religious Services: More than 4 times per year    Active Member of [employer] or Organizations: No    Attends Banker Meetings: Never    Marital Status: Married  Catering manager Violence: Not At Risk (12/12/2022)   Humiliation, Afraid, Rape, and Kick questionnaire    Fear of Current or Ex-Partner: No    Emotionally Abused: No    Physically Abused: No    Sexually Abused: No    Family History  Problem Relation Age of Onset   Diabetes Mother    Hypertension Mother    Brain cancer Father        d. 18   Hypertension Sister    Diabetes Sister    Diabetes Brother    Breast cancer Paternal Aunt        dx >50   Uterine cancer Paternal Aunt        dx >50   Cancer Maternal Grandmother        unknown type, died in her 27s   Pancreatic cancer Cousin 56       maternal first cousin   Uterine cancer Cousin        dx 43s, maternal first cousin   Breast cancer Cousin 14       paternal first cousin    No Known Allergies    PE Today's Vitals   07/25/23 1331  BP: 118/80  Pulse: 72  Temp: 98.4 F (36.9 C)  TempSrc: Oral  SpO2: 99%  Weight: 148 lb (67.1 kg)  Height: 4' 11.75" (1.518 m)   Body mass index is 29.15 kg/m.  Physical Exam Vitals reviewed. Exam conducted with a chaperone present.  Constitutional:      General: She is not in acute distress.    Appearance: Normal appearance.  HENT:     Head: Normocephalic and atraumatic.     Nose: Nose normal.  Eyes:     Extraocular Movements: Extraocular movements intact.     Conjunctiva/sclera: Conjunctivae normal.  Neck:     Thyroid: No thyroid  mass, thyromegaly or thyroid tenderness.  Pulmonary:     Effort: Pulmonary effort is normal.  Chest:     Chest wall: No mass or tenderness.  Breasts:    Right: Normal. No swelling, mass, nipple discharge or tenderness.     Left: Normal. No swelling, mass, nipple discharge or tenderness.       Comments: Left lumpectomy scar Abdominal:     General: There is no distension.     Palpations: Abdomen is soft.     Tenderness: There is no abdominal tenderness.  Genitourinary:    General: Normal vulva.     Exam position: Lithotomy position.     Urethra: No prolapse.     Vagina: Normal. No vaginal discharge or bleeding.     Cervix: No lesion.     Adnexa: Right adnexa normal and left adnexa normal.     Comments: Cervix and uterus absent Musculoskeletal:        General: Normal range of motion.     Cervical back: Normal range of motion.  Lymphadenopathy:     Upper Body:     Right upper body: No axillary adenopathy.     Left upper body: No axillary adenopathy.     Lower Body: No right inguinal adenopathy. No left inguinal adenopathy.  Skin:    General: Skin is warm and dry.  Neurological:     General: No focal deficit present.     Mental Status: She is alert.  Psychiatric:        Mood and Affect: Mood normal.        Behavior: Behavior normal.     Assessment and Plan:        Encounter for breast and pelvic examination Assessment & Plan: Cervical cancer screening performed according to ASCCP guidelines. Encouraged annual mammogram screening Colonoscopy UTD DXA UTD Labs and immunizations with her primary Encouraged safe sexual practices as indicated Encouraged healthy lifestyle practices with diet and exercise For patients over 70yo, I recommend 1200mg  calcium daily and 800IU of vitamin D daily.    S/P total hysterectomy  VAIN I (vaginal intraepithelial neoplasia grade I) -     Cytology - PAP  Personal history of other medical treatment  Malignant neoplasm of lower-outer  quadrant of left breast of female, estrogen receptor positive (HCC)  Continue annual MMG, anastrozole  recs per oncology Normal exam today   Romaine Closs, MD

## 2023-07-30 ENCOUNTER — Other Ambulatory Visit: Payer: Self-pay | Admitting: Family Medicine

## 2023-07-30 LAB — CYTOLOGY - PAP
Comment: NEGATIVE
Diagnosis: NEGATIVE
High risk HPV: NEGATIVE

## 2023-07-31 ENCOUNTER — Encounter: Payer: Self-pay | Admitting: Obstetrics and Gynecology

## 2023-08-16 ENCOUNTER — Ambulatory Visit: Payer: Self-pay | Admitting: Family Medicine

## 2023-08-16 ENCOUNTER — Encounter: Payer: Self-pay | Admitting: Family Medicine

## 2023-08-16 ENCOUNTER — Ambulatory Visit (INDEPENDENT_AMBULATORY_CARE_PROVIDER_SITE_OTHER): Payer: Medicare HMO | Admitting: Family Medicine

## 2023-08-16 VITALS — BP 128/74 | HR 67 | Ht 59.75 in | Wt 146.0 lb

## 2023-08-16 DIAGNOSIS — I10 Essential (primary) hypertension: Secondary | ICD-10-CM

## 2023-08-16 DIAGNOSIS — R7303 Prediabetes: Secondary | ICD-10-CM | POA: Diagnosis not present

## 2023-08-16 DIAGNOSIS — M5431 Sciatica, right side: Secondary | ICD-10-CM | POA: Diagnosis not present

## 2023-08-16 DIAGNOSIS — E782 Mixed hyperlipidemia: Secondary | ICD-10-CM

## 2023-08-16 DIAGNOSIS — Z0001 Encounter for general adult medical examination with abnormal findings: Secondary | ICD-10-CM

## 2023-08-16 DIAGNOSIS — N390 Urinary tract infection, site not specified: Secondary | ICD-10-CM | POA: Diagnosis not present

## 2023-08-16 DIAGNOSIS — I340 Nonrheumatic mitral (valve) insufficiency: Secondary | ICD-10-CM

## 2023-08-16 DIAGNOSIS — C50512 Malignant neoplasm of lower-outer quadrant of left female breast: Secondary | ICD-10-CM

## 2023-08-16 DIAGNOSIS — Z8673 Personal history of transient ischemic attack (TIA), and cerebral infarction without residual deficits: Secondary | ICD-10-CM

## 2023-08-16 DIAGNOSIS — E876 Hypokalemia: Secondary | ICD-10-CM

## 2023-08-16 DIAGNOSIS — D126 Benign neoplasm of colon, unspecified: Secondary | ICD-10-CM

## 2023-08-16 LAB — CBC WITH DIFFERENTIAL/PLATELET
Basophils Absolute: 0 10*3/uL (ref 0.0–0.1)
Basophils Relative: 0.2 % (ref 0.0–3.0)
Eosinophils Absolute: 0.1 10*3/uL (ref 0.0–0.7)
Eosinophils Relative: 1.9 % (ref 0.0–5.0)
HCT: 38.1 % (ref 36.0–46.0)
Hemoglobin: 13.1 g/dL (ref 12.0–15.0)
Lymphocytes Relative: 26.6 % (ref 12.0–46.0)
Lymphs Abs: 1.1 10*3/uL (ref 0.7–4.0)
MCHC: 34.3 g/dL (ref 30.0–36.0)
MCV: 87.2 fl (ref 78.0–100.0)
Monocytes Absolute: 0.3 10*3/uL (ref 0.1–1.0)
Monocytes Relative: 7 % (ref 3.0–12.0)
Neutro Abs: 2.6 10*3/uL (ref 1.4–7.7)
Neutrophils Relative %: 64.3 % (ref 43.0–77.0)
Platelets: 252 10*3/uL (ref 150.0–400.0)
RBC: 4.37 Mil/uL (ref 3.87–5.11)
RDW: 12.7 % (ref 11.5–15.5)
WBC: 4.1 10*3/uL (ref 4.0–10.5)

## 2023-08-16 LAB — URINALYSIS, ROUTINE W REFLEX MICROSCOPIC
Bilirubin Urine: NEGATIVE
Ketones, ur: NEGATIVE
Leukocytes,Ua: NEGATIVE
Nitrite: NEGATIVE
Specific Gravity, Urine: 1.015 (ref 1.000–1.030)
Total Protein, Urine: NEGATIVE
Urine Glucose: NEGATIVE
Urobilinogen, UA: 0.2 (ref 0.0–1.0)
pH: 6.5 (ref 5.0–8.0)

## 2023-08-16 LAB — COMPREHENSIVE METABOLIC PANEL WITH GFR
ALT: 14 U/L (ref 0–35)
AST: 19 U/L (ref 0–37)
Albumin: 4.7 g/dL (ref 3.5–5.2)
Alkaline Phosphatase: 83 U/L (ref 39–117)
BUN: 12 mg/dL (ref 6–23)
CO2: 27 meq/L (ref 19–32)
Calcium: 9.6 mg/dL (ref 8.4–10.5)
Chloride: 96 meq/L (ref 96–112)
Creatinine, Ser: 0.51 mg/dL (ref 0.40–1.20)
GFR: 93.69 mL/min (ref >60.00)
Glucose, Bld: 107 mg/dL — ABNORMAL HIGH (ref 70–99)
Potassium: 3.6 meq/L (ref 3.5–5.1)
Sodium: 132 meq/L — ABNORMAL LOW (ref 135–145)
Total Bilirubin: 0.7 mg/dL (ref 0.2–1.2)
Total Protein: 7.5 g/dL (ref 6.0–8.3)

## 2023-08-16 LAB — LIPID PANEL
Cholesterol: 155 mg/dL (ref 0–200)
HDL: 48.8 mg/dL (ref >39.00)
LDL Cholesterol: 92 mg/dL (ref 0–99)
NonHDL: 106.48
Total CHOL/HDL Ratio: 3
Triglycerides: 72 mg/dL (ref 0.0–149.0)
VLDL: 14.4 mg/dL (ref 0.0–40.0)

## 2023-08-16 LAB — HEMOGLOBIN A1C: Hgb A1c MFr Bld: 5.7 % (ref 4.6–6.5)

## 2023-08-16 LAB — TSH: TSH: 3.06 u[IU]/mL (ref 0.35–5.50)

## 2023-08-16 MED ORDER — PREDNISONE 10 MG PO TABS: ORAL_TABLET | ORAL | 0 refills | Status: DC

## 2023-08-16 NOTE — Patient Instructions (Signed)
 Please return in 6 months for hypertension follow up.   I will release your lab results to you on your MyChart account with further instructions. You may see the results before I do, but when I review them I will send you a message with my report or have my assistant call you if things need to be discussed. Please reply to my message with any questions. Thank you!   If you have any questions or concerns, please don't hesitate to send me a message via MyChart or call the office at [phone]. Thank you for visiting with us  today! It's our pleasure caring for you.    VISIT SUMMARY: Today, you came in with sudden onset low back pain radiating to your leg, a sore throat, and concerns about a possible urinary tract infection. We discussed your symptoms and created a plan to address each issue.  YOUR PLAN: -SCIATICA: Sciatica is pain that radiates along the path of the sciatic nerve, which runs from your lower back through your hips and down each leg. We believe your pain is due to nerve impingement. You have been prescribed prednisone to help reduce inflammation and relieve pain. Please let us  know if your symptoms persist.  -SORE THROAT: Your sore throat, which has been persistent without fever or drainage, may be due to allergies. We recommend taking Zyrtec at night to help with allergy symptoms. The prednisone prescribed for your back pain may also help alleviate your sore throat.  -URINARY TRACT INFECTION: A urinary tract infection (UTI) is an infection in any part of your urinary system. You have reported some pressure and are concerned about a possible UTI. We have ordered a urinalysis to evaluate for a UTI.  INSTRUCTIONS: Please follow up with us  if your symptoms persist or worsen. Make sure to take the prescribed medications as directed. We will contact you with the results of your urinalysis.                      Contains text generated by Abridge.                                  Contains text generated by Abridge.

## 2023-08-16 NOTE — Progress Notes (Signed)
 See mychart note Dear Ms. Rodak, Your urine does not appear infected at this time. I will await the culture results to be certain.  Sincerely, Dr. Jonelle Neri

## 2023-08-16 NOTE — Progress Notes (Signed)
 Subjective  Chief Complaint  Patient presents with   Annual Exam    Pt here for Annual Exam and is currently fasting    Back Pain    Lower back pain and it hurt while she is sitting. She will have to lean/sit sideways     HPI: Debra Simon is a 73 y.o. female who presents to [facility] today for a Female Wellness Visit. She also has the concerns and/or needs as listed above in the chief complaint. These will be addressed in addition to the Health Maintenance Visit.   Wellness Visit: annual visit with health maintenance review and exam  HM: mammo scheduled, h/o breast ca on arimedex and stable. Pap done and nl (h/o VAIN). CRC screen due again next year for h/o serrated tubular adenoma. Active lifestyle  Chronic disease f/u and/or acute problem visit: (deemed necessary to be done in addition to the wellness visit): Discussed the use of AI scribe software for clinical note transcription with the patient, who gave verbal consent to proceed.  History of Present Illness Debra Simon is a 74 year old female who presents with sudden onset low back pain radiating to the leg.  She experienced a sudden onset of severe low back pain approximately one week ago. The pain initially started in her back and has since radiated down her leg, past the knee. The pain is described as pinching when sitting and is relieved when lying down. She has been taking Tylenol , which provides some relief. No previous episodes of similar pain, although she has experienced hip pain in the past when gardening. No bowel or bladder incontinence, but she has a history of constipation and urinary tract infections. She feels some pressure and is concerned about a possible returning urinary infection, although she has not experienced burning or incontinence.  Additionally, she reports a sore throat and feeling hot internally for about a week and a half, with no fever. No drainage or sneezing.  No cough.   No significant history of allergies but she has been outside in the garden much over the last several weeks.  History of recurrent UTIs with some urinary symptoms as noted above.  No dysuria or gross hematuria at this time.  F/u chronic problems: HTN: remains well-controlled on HCTZ/ACE combo.  Takes potassium supplements.  No chest pain shortness of breath.  No edema.  Hyperlipidemia on statin fasting for recheck  History of prediabetes: Tries to eat a healthy diet.  She remains very active.  No symptoms of hyperglycemia.  Assessment  1. Encounter for well adult exam with abnormal findings   2. Essential hypertension   3. Malignant neoplasm of lower-outer quadrant of left breast of female, estrogen receptor positive (HCC)   4. Mixed hyperlipidemia   5. Prediabetes   6. Serrated adenoma of colon   7. History of lacunar cerebrovascular accident (CVA)   8. Nonrheumatic mitral valve regurgitation   9. Diuretic-induced hypokalemia   10. Recurrent UTI (urinary tract infection)   11. Right sided sciatica      Plan  Female Wellness Visit: Age appropriate Health Maintenance and Prevention measures were discussed with patient. Included topics are cancer screening recommendations, ways to keep healthy (see AVS) including dietary and exercise recommendations, regular eye and dental care, use of seat belts, and avoidance of moderate alcohol use and tobacco use.  Mammogram scheduled.  Other screens current.  Colonoscopy due next year BMI: discussed patient's BMI and encouraged positive lifestyle modifications to  help get to or maintain a target BMI. HM needs and immunizations were addressed and ordered. See below for orders. See HM and immunization section for updates.  Up-to-date Routine labs and screening tests ordered including cmp, cbc and lipids where appropriate. Discussed recommendations regarding Vit D and calcium supplementation (see AVS)  Chronic disease management visit and/or acute  problem visit: Assessment and Plan Assessment & Plan Sciatica Acute low back pain radiating down the leg, likely nerve impingement. No bowel or bladder incontinence. - Prescribed prednisone for inflammation and pain relief. - Advised to report if symptoms persist.  Sore throat Persistent sore throat without fever or drainage, possible allergic cause. - Recommended Zyrtec at night for allergy symptoms. - Noted prednisone may alleviate symptoms.  Urinary tract infection Possible recurrence of UTI symptoms without burning or incontinence. - Ordered urinalysis to evaluate for UTI.  HTN: well controlled  Recheck potassium and lytes. Ace/hydrochlorothiazide  and potassium supplements.   HLD on statin fasting for recheck  H/o prediabetes w/o sxs. Check A1c today  Follow up: 6 mo for htn recheck  Orders Placed This Encounter  Procedures   CBC with Differential/Platelet   Comprehensive metabolic panel with GFR   Lipid panel   Hemoglobin A1c   TSH   Urinalysis, Routine w reflex microscopic   Meds ordered this encounter  Medications   predniSONE (DELTASONE) 10 MG tablet    Sig: Take 4 tabs qd x 2 days, 3 qd x 2 days, 2 qd x 2d, 1qd x 3 days    Dispense:  21 tablet    Refill:  0      Body mass index is 28.75 kg/m. Wt Readings from Last 3 Encounters:  08/16/23 146 lb (66.2 kg)  07/25/23 148 lb (67.1 kg)  07/10/23 147 lb (66.7 kg)     Patient Active Problem List   Diagnosis Date Noted Date Diagnosed   Prediabetes 11/11/2020     Priority: High   Malignant neoplasm of lower-outer quadrant of left breast of female, estrogen receptor positive (HCC) 09/07/2020     Priority: High    Mammogram due June 2023 at [facility]     Serrated adenoma of colon 09/27/2019     Priority: High    Colonoscopy 2016, performed [place] , recommend 5-year follow-up Colonoscopy August 2021, serrated adenoma, repeat in 5 years    Mixed hyperlipidemia 06/02/2016     Priority: High   Essential  hypertension 07/31/2013     Priority: High    Hypokalemia with hctz25; limit to 12.5 on oral supplements    Blindness right eye category 3, blindness left eye category 3 09/09/2020     Priority: Medium    Umbilical hernia without obstruction and without gangrene 07/08/2020     Priority: Medium    History of lacunar cerebrovascular accident (CVA) 09/27/2019     Priority: Medium     MRI 06/23/15 when working up dizziness, on aspirin  since    Bilateral primary osteoarthritis of knee 06/05/2017     Priority: Medium    History of vaginal dysplasia 08/31/2016     Priority: Medium    Systolic murmur 08/06/2021     Priority: Low    Noted 07/2021, mild MR on echocardiogram 2018. Nl Aortic valve. Nl EF    Varicose veins of both lower extremities with pain 07/08/2020     Priority: Low   Diverticulosis      Priority: Low   Nonrheumatic mitral valve regurgitation 07/04/2016     Priority: Low  Noted 07/2021, mild MR on echocardiogram 2018. Nl Aortic valve. Nl EF    Sensorineural hearing loss (SNHL), bilateral 07/07/2015     Priority: Low   Rectocele 05/30/2014     Priority: Low   Encounter for breast and pelvic examination 07/25/2023    S/P total hysterectomy 07/25/2023    Personal history of breast cancer 07/25/2023    VAIN I (vaginal intraepithelial neoplasia grade I) 07/25/2023    Recurrent UTI (urinary tract infection) 02/10/2023    Diuretic-induced hypokalemia 02/10/2023    Idiopathic guttate hypomelanosis 02/10/2023    Genetic testing 09/16/2020     Negative genetic testing:  No pathogenic variants detected on the [employer] BRCAplus panel (report date 09/15/2020) or the CancerNext-Expanded + RNAinsight panel (report date 09/19/2020).    The BRCAplus panel offered by [employer] and includes sequencing and deletion/duplication analysis for the following 8 genes: ATM, BRCA1, BRCA2, CDH1, CHEK2, PALB2, PTEN, and TP53. The CancerNext-Expanded + RNAinsight gene panel offered by [employer] and includes sequencing and rearrangement analysis for the following 77 genes: AIP, ALK, APC, ATM, AXIN2, BAP1, BARD1, BLM, BMPR1A, BRCA1, BRCA2, BRIP1, CDC73, CDH1, CDK4, CDKN1B, CDKN2A, CHEK2, CTNNA1, DICER1, FANCC, FH, FLCN, GALNT12, KIF1B, LZTR1, MAX, MEN1, MET, MLH1, MSH2, MSH3, MSH6, MUTYH, NBN, NF1, NF2, NTHL1, PALB2, PHOX2B, PMS2, POT1, PRKAR1A, PTCH1, PTEN, RAD51C, RAD51D, RB1, RECQL, RET, SDHA, SDHAF2, SDHB, SDHC, SDHD, SMAD4, SMARCA4, SMARCB1, SMARCE1, STK11, SUFU, TMEM127, TP53, TSC1, TSC2, VHL and XRCC2 (sequencing and deletion/duplication); EGFR, EGLN1, HOXB13, KIT, MITF, PDGFRA, POLD1 and POLE (sequencing only); EPCAM and GREM1 (deletion/duplication only). RNA data is routinely analyzed for use in variant interpretation for all genes.    Family history of breast cancer 09/10/2020    Family history of brain cancer 09/10/2020    Family history of uterine cancer 09/10/2020    Family history of pancreatic cancer 09/10/2020    Health Maintenance  Topic Date Due   COVID-19 Vaccine (4 - 2024-25 season) 09/01/2023 (Originally 11/27/2022)   MAMMOGRAM  08/19/2023   INFLUENZA VACCINE  10/27/2023   Medicare Annual Wellness (AWV)  12/12/2023   Colonoscopy  02/13/2025   DEXA SCAN  08/18/2026   DTaP/Tdap/Td (2 - Td or Tdap) 01/23/2028   Pneumonia Vaccine 58+ Years old  Completed   Hepatitis C Screening  Completed   Zoster Vaccines- Shingrix  Completed   HPV VACCINES  Aged Out   Meningococcal B Vaccine  Aged Out   Immunization History  Administered Date(s) Administered   Fluad Quad(high Dose 65+) 01/08/2020, 01/04/2021, 02/10/2022   Fluad Trivalent(High Dose 65+) 02/10/2023   Influenza, High Dose Seasonal PF 12/26/2017   Influenza,inj,Quad PF,6+ Mos 01/09/2019   Influenza-Unspecified 01/04/2021   Moderna Sars-Covid-2 Vaccination 07/26/2019, 08/23/2019   PFIZER(Purple Top)SARS-COV-2 Vaccination 03/05/2020   Pneumococcal Conjugate-13 12/26/2017   Pneumococcal Polysaccharide-23  09/27/2019   Tdap 01/22/2018   Zoster Recombinant(Shingrix) 07/22/2016, 11/18/2016   We updated and reviewed the patient's past history in detail and it is documented below. Allergies: Patient has no known allergies. Past Medical History Patient  has a past medical history of Anxiety, Arthritis, Breast cancer (HCC), Colon polyps (2010), Diastolic dysfunction, left ventricle (06/26/2016), Diverticulosis, Family history of brain cancer, Family history of breast cancer, Family history of pancreatic cancer, Family history of uterine cancer, GERD (gastroesophageal reflux disease), Headache, Heart murmur, History of radiation therapy, Hyperlipidemia, Hypertension, Mild mitral regurgitation (07/04/2016), Personal history of radiation therapy, Pneumonia, Pre-diabetes, Prediabetes (11/11/2020), Primary localized osteoarthritis of right knee (05/24/2017), Ureterovaginal fistula, Uterine fibroid (1994), and Vaginal dysplasia.  Past Surgical History Patient  has a past surgical history that includes Cystoscopy; Ureteral exploration (04/16/1992); Esophagogastroduodenoscopy (2010); Breast cyst aspiration (Right); Breast excisional biopsy (Right); Total abdominal hysterectomy (1994); Total knee arthroplasty (Right, 06/05/2017); Total knee arthroplasty (Right, 06/05/2017); Ueterovaginal fistual repair; Colonoscopy (2010); Colonoscopy (2012); Colonoscopy (2016); Breast lumpectomy with radioactive seed and sentinel lymph node biopsy (Left, 10/15/2020); and Breast lumpectomy. Family History: Patient family history includes Brain cancer in her father; Breast cancer in her paternal aunt; Breast cancer (age of onset: 44) in her cousin; Cancer in her maternal grandmother; Diabetes in her brother, mother, and sister; Hypertension in her mother and sister; Pancreatic cancer (age of onset: 37) in her cousin; Uterine cancer in her cousin and paternal aunt. Social History:  Patient  reports that she has never smoked. She has never  used smokeless tobacco. She reports that she does not drink alcohol and does not use drugs.  Review of Systems: Constitutional: negative for fever or malaise Ophthalmic: negative for photophobia, double vision or loss of vision Cardiovascular: negative for chest pain, dyspnea on exertion, or new LE swelling Respiratory: negative for SOB or persistent cough Gastrointestinal: negative for abdominal pain, change in bowel habits or melena Genitourinary: negative for dysuria or gross hematuria, no abnormal uterine bleeding or disharge Musculoskeletal: negative for new gait disturbance or muscular weakness Integumentary: negative for new or persistent rashes, no breast lumps Neurological: negative for TIA or stroke symptoms Psychiatric: negative for SI or delusions Allergic/Immunologic: negative for hives  Patient Care Team    Relationship Specialty Notifications Start End  Luevenia Saha, MD PCP - General Family Medicine  09/27/19   Elly Habermann, MD Consulting Physician Orthopedic Surgery  08/09/18   Lavoie, Marie-Lyne, MD Consulting Physician Obstetrics and Gynecology  09/27/19   Lockie Rima, MD Consulting Physician General Surgery  09/07/20   Retta Caster, MD Consulting Physician Radiation Oncology  09/07/20   Percival Brace, NP Nurse Practitioner Hematology and Oncology  12/25/20   Neda Balk, RN Registered Nurse   12/25/20   Murleen Arms, MD Consulting Physician Hematology and Oncology  04/07/21   Andrez Banker, MD Attending Physician Urology  02/10/23     Objective  Vitals: BP 128/74 (Cuff Size: Normal)   Pulse 67   Ht 4' 11.75" (1.518 m)   Wt 146 lb (66.2 kg)   SpO2 99%   BMI 28.75 kg/m  General:  Well developed, well nourished, no acute distress , uncomfortable getting to exam table, slightly Psych:  Alert and orientedx3,normal mood and affect HEENT:  Normocephalic, atraumatic, non-icteric sclera,  supple neck without adenopathy, mass or  thyromegaly Cardiovascular:  Normal S1, S2, RRR without gallop, rub + murmur Respiratory:  Good breath sounds bilaterally, CTAB with normal respiratory effort Gastrointestinal: normal bowel sounds, soft, non-tender, no noted masses. No HSM MSK: extremities without edema, joints without erythema or swelling Back: left sciatic notch ttp, no other ttp. FROM, neg SLR bilaterally. Nl LE strength bilaterally Nl reflexes both legs Neurologic:    Mental status is normal.  Gross motor and sensory exams are normal.  No tremor  Commons side effects, risks, benefits, and alternatives for medications and treatment plan prescribed today were discussed, and the patient expressed understanding of the given instructions. Patient is instructed to call or message via MyChart if he/she has any questions or concerns regarding our treatment plan. No barriers to understanding were identified. We discussed Red Flag symptoms and signs in detail. Patient expressed understanding regarding what to do in  case of urgent or emergency type symptoms.  Medication list was reconciled, printed and provided to the patient in AVS. Patient instructions and summary information was reviewed with the patient as documented in the AVS. This note was prepared with assistance of Dragon voice recognition software. Occasional wrong-word or sound-a-like substitutions may have occurred due to the inherent limitations of voice recognition software

## 2023-08-17 MED ORDER — ROSUVASTATIN CALCIUM 10 MG PO TABS: 10.0000 mg | ORAL_TABLET | Freq: Every day | ORAL | 3 refills | Status: AC

## 2023-08-17 NOTE — Progress Notes (Signed)
 See mychart note Dear Ms. Wiltgen, Your lab results look stable.  I have ordered a different cholesterol pill for you: please stop the simvastatin  and start the crestor 10mg  nightly. I would like your cholesterol levels to be a little lower and this medication should get you there. No other changes are needed at this time. I hope your back is feeling better.  Sincerely, Dr. Jonelle Neri

## 2023-08-22 ENCOUNTER — Ambulatory Visit
Admission: RE | Admit: 2023-08-22 | Discharge: 2023-08-22 | Disposition: A | Discharge status: Home / Self Care | Payer: Medicare HMO | Source: Ambulatory Visit | Attending: Hematology and Oncology | Admitting: Hematology and Oncology

## 2023-08-22 DIAGNOSIS — Z08 Encounter for follow-up examination after completed treatment for malignant neoplasm: Secondary | ICD-10-CM | POA: Diagnosis not present

## 2023-08-22 DIAGNOSIS — Z853 Personal history of malignant neoplasm of breast: Secondary | ICD-10-CM | POA: Diagnosis not present

## 2023-08-22 DIAGNOSIS — Z170 Estrogen receptor positive status [ER+]: Secondary | ICD-10-CM

## 2023-09-01 ENCOUNTER — Ambulatory Visit (INDEPENDENT_AMBULATORY_CARE_PROVIDER_SITE_OTHER): Admitting: Family Medicine

## 2023-09-01 ENCOUNTER — Encounter: Payer: Self-pay | Admitting: Family Medicine

## 2023-09-01 VITALS — BP 110/70 | HR 83 | Temp 97.9°F | Ht 59.75 in | Wt 143.6 lb

## 2023-09-01 DIAGNOSIS — R300 Dysuria: Secondary | ICD-10-CM | POA: Diagnosis not present

## 2023-09-01 DIAGNOSIS — N390 Urinary tract infection, site not specified: Secondary | ICD-10-CM

## 2023-09-01 DIAGNOSIS — R350 Frequency of micturition: Secondary | ICD-10-CM | POA: Diagnosis not present

## 2023-09-01 DIAGNOSIS — I10 Essential (primary) hypertension: Secondary | ICD-10-CM | POA: Diagnosis not present

## 2023-09-01 LAB — POCT URINALYSIS DIPSTICK
Bilirubin, UA: NEGATIVE
Blood, UA: NEGATIVE
Glucose, UA: NEGATIVE
Ketones, UA: POSITIVE — ABNORMAL HIGH
Nitrite, UA: NEGATIVE
Protein, UA: POSITIVE — AB
Spec Grav, UA: 1.01 (ref 1.010–1.025)
Urobilinogen, UA: 0.2 U/dL
pH, UA: 7.5 (ref 5.0–8.0)

## 2023-09-01 MED ORDER — CEPHALEXIN 500 MG PO CAPS: 500.0000 mg | ORAL_CAPSULE | Freq: Two times a day (BID) | ORAL | 0 refills | Status: DC

## 2023-09-01 MED ORDER — METHENAMINE HIPPURATE 1 G PO TABS: 1.0000 g | ORAL_TABLET | Freq: Two times a day (BID) | ORAL | 3 refills | Status: AC

## 2023-09-01 NOTE — Patient Instructions (Addendum)
 Please follow up as scheduled for your next visit with me: 02/19/2024   If you have any questions or concerns, please don't hesitate to send me a message via MyChart or call the office at [phone]. Thank you for visiting with us  today! It's our pleasure caring for you.    VISIT SUMMARY: Today, we discussed your symptoms of a urinary tract infection (UTI) and your history of recurrent UTIs. We also talked about your chronic constipation and its potential link to your UTIs. Additionally, we reviewed your blood pressure readings and addressed your need for a medication refill.  YOUR PLAN: -URINARY TRACT INFECTION (UTI): A UTI is an infection in any part of your urinary system. You have recurrent UTIs, and this time you are experiencing pain and discomfort. We will treat this with antibiotics to clear the infection.  -CONSTIPATION: Constipation means having fewer than three bowel movements a week or having stools that are hard, dry, or difficult to pass. Your constipation might be contributing to your recurrent UTIs. We recommend you take Miralax  daily to help with this issue.  -HYPERTENSION: Hypertension is high blood pressure. Although your blood pressure was elevated during today's visit, it is usually well-controlled at home. Please continue to monitor your blood pressure regularly at home.  -MEDICATION REFILL: You need a refill for a medication that was discontinued since February 19th. We will refill this medication as requested.  INSTRUCTIONS: Please take the prescribed antibiotics as directed to treat your UTI. Use Miralax  daily to help with constipation. Continue to monitor your blood pressure at home and keep a record of your readings. Follow up with your urologist as planned in October for your medication review.                      Contains text generated by [employer]bridge.                                 Contains text generated by  [employer]bridge.

## 2023-09-01 NOTE — Progress Notes (Signed)
 Subjective  CC:  Chief Complaint  Patient presents with   Dysuria    urination burning,pressure for the past week.    Constipation    HPI: Debra Simon is a 73 y.o. female who presents to the office today to address the problems listed above in the chief complaint. Discussed the use of AI scribe software for clinical note transcription with the patient, who gave verbal consent to proceed.  History of Present Illness Debra Chaney is a 73 year old female with recurrent urinary tract infections who presents with symptoms of a urinary tract infection.  She began experiencing symptoms of a urinary tract infection last Friday, including dysuria and pain localized between the vagina and bladder. She initially took medication prescribed by her urologist to prevent infections, which seemed effective as she did not develop a fever. However, the symptoms persisted, and she noticed the pain also occurred during bowel movements.  She has a history of constipation, which she manages by eating a diet rich in vegetables and avoiding carbohydrates such as bread, rice, and pasta. Despite these dietary changes, she describes her stools as being 'like Play-Doh'.  She has been off prednisone  for about a week, which she had been taking for back pain, and she notes that her back pain has improved. However, she experienced lightheadedness and balance issues, which have since improved.  At home, her blood pressure readings have not exceeded 110/70. She monitors both her blood pressure and blood sugar at home. She admits she was stressed upon arriving here for this appointment today.    Assessment  1. Dysuria   2. Frequent urination   3. Recurrent UTI (urinary tract infection)   4. Essential hypertension      Plan  Assessment and Plan Assessment & Plan Urinary Tract Infection (UTI) Recurrent UTI with dysuria and pelvic pain. No fever. History of recurrent UTIs, previously on prophylactic medication.  Possible link to constipation. - Prescribe antibiotics for UTI treatment. Keflex , check culture. Trigger: constipation - restart methenamine hippurate for prophylaxis  Constipation Chronic constipation exacerbated despite dietary measures. Potential contributor to recurrent UTIs. - Recommend daily use of Miralax .  Hypertension Elevated blood pressure during visit, typically well-controlled at home. Today's elevation likely situational. - Monitor blood pressure regularly at home.   Follow up: as scheduled Orders Placed This Encounter  Procedures   Urine Culture   POCT Urinalysis Dipstick   Meds ordered this encounter  Medications   cephALEXin  (KEFLEX ) 500 MG capsule    Sig: Take 1 capsule (500 mg total) by mouth 2 (two) times daily.    Dispense:  14 capsule    Refill:  0   methenamine (HIPREX) 1 g tablet    Sig: Take 1 tablet (1 g total) by mouth in the morning and at bedtime. Take with vit C    Dispense:  180 tablet    Refill:  3     I reviewed the patients updated PMH, FH, and SocHx.  Patient Active Problem List   Diagnosis Date Noted   Prediabetes 11/11/2020    Priority: High   Malignant neoplasm of lower-outer quadrant of left breast of female, estrogen receptor positive (HCC) 09/07/2020    Priority: High   Serrated adenoma of colon 09/27/2019    Priority: High   Mixed hyperlipidemia 06/02/2016    Priority: High   Essential hypertension 07/31/2013    Priority: High   Diuretic-induced hypokalemia 02/10/2023    Priority: Medium    Blindness right eye category  3, blindness left eye category 3 09/09/2020    Priority: Medium    Umbilical hernia without obstruction and without gangrene 07/08/2020    Priority: Medium    History of lacunar cerebrovascular accident (CVA) 09/27/2019    Priority: Medium    Bilateral primary osteoarthritis of knee 06/05/2017    Priority: Medium    History of vaginal dysplasia 08/31/2016    Priority: Medium    Systolic murmur 08/06/2021     Priority: Low   Varicose veins of both lower extremities with pain 07/08/2020    Priority: Low   Diverticulosis     Priority: Low   Nonrheumatic mitral valve regurgitation 07/04/2016    Priority: Low   Sensorineural hearing loss (SNHL), bilateral 07/07/2015    Priority: Low   Rectocele 05/30/2014    Priority: Low   Encounter for breast and pelvic examination 07/25/2023   S/P total hysterectomy 07/25/2023   Personal history of breast cancer 07/25/2023   VAIN I (vaginal intraepithelial neoplasia grade I) 07/25/2023   Recurrent UTI (urinary tract infection) 02/10/2023   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 START December 26, 2020   aspirin  EC 81 MG tablet Take 1 tablet (81 mg total) by mouth daily. (Patient taking differently: Take 81 mg by mouth.)   Cranberry 50 MG CHEW Chew by mouth.   KLOR-CON  M20 20 MEQ tablet TAKE 1 TABLET BY MOUTH EVERY DAY   Lactobacillus (PROBIOTIC ACIDOPHILUS PO) Take by mouth.   losartan -hydrochlorothiazide  (HYZAAR) 100-12.5 MG tablet TAKE 1 TABLET BY MOUTH EVERY DAY   rosuvastatin  (CRESTOR ) 10 MG tablet Take 1 tablet (10 mg total) by mouth daily.   [DISCONTINUED] predniSONE  (DELTASONE ) 10 MG tablet Take 4 tabs qd x 2 days, 3 qd x 2 days, 2 qd x 2d, 1qd x 3 days   cephALEXin  (KEFLEX ) 500 MG capsule Take 1 capsule (500 mg total) by mouth 2 (two) times daily.   methenamine (HIPREX) 1 g tablet Take 1 tablet (1 g total) by mouth in the morning and at bedtime. Take with vit C   Allergies: Patient has no known allergies. Family History: Patient family history includes Brain cancer in her father; Breast cancer in her paternal aunt;  Breast cancer (age of onset: 58) in her cousin; Cancer in her maternal grandmother; Diabetes in her brother, mother, and sister; Hypertension in her mother and sister; Pancreatic cancer (age of onset: 30) in her cousin; Uterine cancer in her cousin and paternal aunt. Social History:  Patient  reports that she has never smoked. She has never used smokeless tobacco. She reports that she does not drink alcohol and does not use drugs.  Review of Systems: Constitutional: Negative for fever malaise or anorexia Cardiovascular: negative for chest pain Respiratory: negative for SOB or persistent cough Gastrointestinal: negative for abdominal pain  Objective  Vitals: BP 110/70 Comment: by consistent home readings  Pulse 83   Temp 97.9 F (36.6 C)   Ht 4' 11.75" (1.518 m)   Wt 143 lb 9.6 oz (65.1 kg)   SpO2 98%   BMI 28.28 kg/m  General: no acute distress , A&Ox3 HEENT: PEERL, conjunctiva normal, neck is supple Cardiovascular:  RRR without murmur or gallop.  Respiratory:  Good breath sounds bilaterally, CTAB with normal respiratory effort no cvat Benign abdomen Skin:  Warm, no rashes  Office Visit on 09/01/2023  Component Date Value Ref Range Status   Color, UA 09/01/2023 yellow   Final   Clarity, UA 09/01/2023 cloudy   Final   Glucose, UA 09/01/2023 Negative  Negative Final   Bilirubin, UA 09/01/2023 negative   Final   Ketones, UA 09/01/2023 positive (H)   Final   Spec Grav, UA 09/01/2023 1.010  1.010 - 1.025 Final   Blood, UA 09/01/2023 negative   Final   pH, UA 09/01/2023 7.5  5.0 - 8.0 Final   Protein, UA 09/01/2023 Positive (A)  Negative Final   Urobilinogen, UA 09/01/2023 0.2  0.2 or 1.0 E.U./dL Final   Nitrite, UA 40/98/1191 negative   Final   Leukocytes, UA 09/01/2023 Moderate (2+) (A)  Negative Final    Commons side effects, risks, benefits, and alternatives for medications and treatment plan prescribed today were discussed, and the patient expressed understanding of the  given instructions. Patient is instructed to call or message via MyChart if he/she has any questions or concerns regarding our treatment plan. No barriers to understanding were identified. We discussed Red Flag symptoms and signs in detail. Patient expressed understanding regarding what to do in case of urgent or emergency type symptoms.  Medication list was reconciled, printed and provided to the patient in AVS. Patient instructions and summary information was reviewed with the patient as documented in the AVS. This note was prepared with assistance of Dragon voice recognition software. Occasional wrong-word or sound-a-like substitutions may have occurred due to the inherent limitations of voice recognition software

## 2023-09-03 ENCOUNTER — Ambulatory Visit: Payer: Self-pay | Admitting: Family Medicine

## 2023-09-03 LAB — URINE CULTURE
MICRO NUMBER:: 16549132
SPECIMEN QUALITY:: ADEQUATE

## 2023-09-03 NOTE — Progress Notes (Signed)
 See mychart note Treated with keflex 

## 2023-11-07 DIAGNOSIS — M1712 Unilateral primary osteoarthritis, left knee: Secondary | ICD-10-CM | POA: Diagnosis not present

## 2023-12-13 DIAGNOSIS — H31011 Macula scars of posterior pole (postinflammatory) (post-traumatic), right eye: Secondary | ICD-10-CM | POA: Diagnosis not present

## 2023-12-13 DIAGNOSIS — H52223 Regular astigmatism, bilateral: Secondary | ICD-10-CM | POA: Diagnosis not present

## 2023-12-13 DIAGNOSIS — H2513 Age-related nuclear cataract, bilateral: Secondary | ICD-10-CM | POA: Diagnosis not present

## 2023-12-13 DIAGNOSIS — H524 Presbyopia: Secondary | ICD-10-CM | POA: Diagnosis not present

## 2023-12-13 DIAGNOSIS — H5203 Hypermetropia, bilateral: Secondary | ICD-10-CM | POA: Diagnosis not present

## 2023-12-13 DIAGNOSIS — M1712 Unilateral primary osteoarthritis, left knee: Secondary | ICD-10-CM | POA: Diagnosis not present

## 2023-12-13 DIAGNOSIS — H35033 Hypertensive retinopathy, bilateral: Secondary | ICD-10-CM | POA: Diagnosis not present

## 2023-12-18 ENCOUNTER — Ambulatory Visit (INDEPENDENT_AMBULATORY_CARE_PROVIDER_SITE_OTHER): Payer: Medicare HMO

## 2023-12-18 VITALS — Ht 59.0 in | Wt 148.0 lb

## 2023-12-18 DIAGNOSIS — Z0000 Encounter for general adult medical examination without abnormal findings: Secondary | ICD-10-CM

## 2023-12-18 NOTE — Progress Notes (Signed)
 Subjective:   Debra Simon is a 73 y.o. who presents for a Medicare Wellness preventive visit.  As a reminder, Annual Wellness Visits don't include a physical exam, and some assessments may be limited, especially if this visit is performed virtually. We may recommend an in-person follow-up visit with your provider if needed.  Visit Complete: Virtual I connected with  Debra Simon on 12/18/23 by a audio enabled telemedicine application and verified that I am speaking with the correct person using two identifiers.  Patient Location: Home  Provider Location: Office/Clinic  I discussed the limitations of evaluation and management by telemedicine. The patient expressed understanding and agreed to proceed.  Vital Signs: Because this visit was a virtual/telehealth visit, some criteria may be missing or patient reported. Any vitals not documented were not able to be obtained and vitals that have been documented are patient reported.  VideoDeclined- This patient declined Librarian, academic. Therefore the visit was completed with audio only.  Persons Participating in Visit: Patient.  AWV Questionnaire: No: Patient Medicare AWV questionnaire was not completed prior to this visit.  Cardiac Risk Factors include: advanced age (>33men, >80 women);dyslipidemia;hypertension     Objective:    Today's Vitals   12/18/23 1511  Weight: 148 lb (67.1 kg)  Height: 4' 11 (1.499 m)   Body mass index is 29.89 kg/m.     12/18/2023    3:19 PM 12/12/2022    3:16 PM 01/18/2021    3:04 PM 12/11/2020   10:23 AM 11/11/2020    1:29 PM 10/12/2020   11:35 AM 09/09/2020   11:58 AM  Advanced Directives  Does Patient Have a Medical Advance Directive? Yes Yes No Yes No No No  Type of Estate agent of [place];Living will Healthcare Power of [place];Living will       Does patient want to make changes to medical advance directive?    Yes (MAU/Ambulatory/Procedural  Areas - Information given)     Copy of Healthcare Power of Attorney in Chart? No - copy requested No - copy requested       Would patient like information on creating a medical advance directive?   No - Patient declined  Yes (MAU/Ambulatory/Procedural Areas - Information given) Yes (MAU/Ambulatory/Procedural Areas - Information given) No - Patient declined    Current Medications (verified) Outpatient Encounter Medications as of 12/18/2023  Medication Sig   anastrozole  (ARIMIDEX ) 1 MG tablet TAKE 1 TABLET (1 MG TOTAL) BY MOUTH DAILY. START December 26, 2020   aspirin  EC 81 MG tablet Take 1 tablet (81 mg total) by mouth daily.   Cranberry 50 MG CHEW Chew by mouth.   KLOR-CON  M20 20 MEQ tablet TAKE 1 TABLET BY MOUTH EVERY DAY   Lactobacillus (PROBIOTIC ACIDOPHILUS PO) Take by mouth.   losartan -hydrochlorothiazide  (HYZAAR) 100-12.5 MG tablet TAKE 1 TABLET BY MOUTH EVERY DAY   methenamine  (HIPREX ) 1 g tablet Take 1 tablet (1 g total) by mouth in the morning and at bedtime. Take with vit C   rosuvastatin  (CRESTOR ) 10 MG tablet Take 1 tablet (10 mg total) by mouth daily.   [DISCONTINUED] cephALEXin  (KEFLEX ) 500 MG capsule Take 1 capsule (500 mg total) by mouth 2 (two) times daily.   No facility-administered encounter medications on file as of 12/18/2023.    Allergies (verified) Patient has no known allergies.   History: Past Medical History:  Diagnosis Date   Anxiety    Arthritis    Breast cancer (HCC)    left breast  2022   Colon polyps 2010   Diastolic dysfunction, left ventricle 06/26/2016   (ECHO 2018): Left ventricle: The cavity size was normal. Systolic function was normal. The estimated ejection fraction was in the range of 60% to 65%. Wall motion was normal; there were no regional wall motion abnormalities. Doppler parameters are consistent with abnormal left ventricular relaxation (grade 1 diastolic dysfunction).   Diverticulosis    Family history of brain cancer    Family history of  breast cancer    Family history of pancreatic cancer    Family history of uterine cancer    GERD (gastroesophageal reflux disease)    watches what she eats   Headache    gets headaches when she eats sweets   Heart murmur    dx last yr.   had echo 05/2016   History of radiation therapy    Left breast- 11/18/20-12/17/20- Dr. Lynwood Nasuti   Hyperlipidemia    Hypertension    Mild mitral regurgitation 07/04/2016   Personal history of radiation therapy    Pneumonia    Pre-diabetes    Prediabetes 11/11/2020   Primary localized osteoarthritis of right knee 05/24/2017   Ureterovaginal fistula    Uterine fibroid 1994   Vaginal dysplasia    Past Surgical History:  Procedure Laterality Date   BREAST CYST ASPIRATION Right    BREAST EXCISIONAL BIOPSY Right    BREAST LUMPECTOMY     BREAST LUMPECTOMY WITH RADIOACTIVE SEED AND SENTINEL LYMPH NODE BIOPSY Left 10/15/2020   Procedure: LEFT BREAST LUMPECTOMY WITH RADIOACTIVE SEED AND SENTINEL LYMPH NODE BIOPSY;  Surgeon: Aron Shoulders, MD;  Location: [facility] OR;  Service: General;  Laterality: Left;   COLONOSCOPY  2010   COLONOSCOPY  2012   COLONOSCOPY  2016   CYSTOSCOPY     ESOPHAGOGASTRODUODENOSCOPY  2010   TOTAL ABDOMINAL HYSTERECTOMY  1994   TOTAL KNEE ARTHROPLASTY Right 06/05/2017   TOTAL KNEE ARTHROPLASTY Right 06/05/2017   Procedure: RIGHT TOTAL KNEE ARTHROPLASTY;  Surgeon: Jane Charleston, MD;  Location: [facility] OR;  Service: Orthopedics;  Laterality: Right;   Ueterovaginal fistual repair     URETERAL EXPLORATION  04/16/1992   URETEROTOMY ON THE RIGHT, REPAIR LACERATION ON THE LEFT   Family History  Problem Relation Age of Onset   Diabetes Mother    Hypertension Mother    Brain cancer Father        d. 45   Hypertension Sister    Diabetes Sister    Diabetes Brother    Breast cancer Paternal Aunt        dx >50   Uterine cancer Paternal Aunt        dx >50   Cancer Maternal Grandmother        unknown type, died in her 61s   Pancreatic  cancer Cousin 48       maternal first cousin   Uterine cancer Cousin        dx 26s, maternal first cousin   Breast cancer Cousin 39       paternal first cousin   Social History   Socioeconomic History   Marital status: Married    Spouse name: Not on file   Number of children: Not on file   Years of education: Not on file   Highest education level: GED or equivalent  Occupational History   Occupation: Housewife  Tobacco Use   Smoking status: Never   Smokeless tobacco: Never  Vaping Use   Vaping status: Never Used  Substance and  Sexual Activity   Alcohol use: No   Drug use: No   Sexual activity: Not Currently    Partners: Male    Birth control/protection: Surgical    Comment: hysterectomy, older than 16, less than 5  Other Topics Concern   Not on file  Social History Narrative   Not on file   Social Drivers of Health   Financial Resource Strain: Low Risk  (12/18/2023)   Overall Financial Resource Strain (CARDIA)    Difficulty of Paying Living Expenses: Not hard at all  Food Insecurity: No Food Insecurity (12/18/2023)   Hunger Vital Sign    Worried About Running Out of Food in the Last Year: Never true    Ran Out of Food in the Last Year: Never true  Transportation Needs: No Transportation Needs (12/18/2023)   PRAPARE - Administrator, Civil Service (Medical): No    Lack of Transportation (Non-Medical): No  Physical Activity: Insufficiently Active (12/18/2023)   Exercise Vital Sign    Days of Exercise per Week: 4 days    Minutes of Exercise per Session: 30 min  Stress: No Stress Concern Present (12/18/2023)   [employer] of Occupational Health - Occupational Stress Questionnaire    Feeling of Stress: Not at all  Social Connections: Socially Integrated (12/18/2023)   Social Connection and Isolation Panel    Frequency of Communication with Friends and Family: More than three times a week    Frequency of Social Gatherings with Friends and Family: More  than three times a week    Attends Religious Services: More than 4 times per year    Active Member of [employer] or Organizations: Yes    Attends Banker Meetings: 1 to 4 times per year    Marital Status: Married    Tobacco Counseling Counseling given: Not Answered    Clinical Intake:  Pre-visit preparation completed: Yes  Pain : 0-10 Pain Type: Chronic pain Pain Location: Knee Pain Orientation: Left Pain Descriptors / Indicators: Aching Pain Onset: More than a month ago Pain Frequency: Intermittent     BMI - recorded: 29.89 Nutritional Status: BMI 25 -29 Overweight Diabetes: No  Lab Results  Component Value Date   HGBA1C 5.7 08/16/2023   HGBA1C 5.9 08/10/2022   HGBA1C 5.9 08/06/2021     How often do you need to have someone help you when you read instructions, pamphlets, or other written materials from your doctor or pharmacy?: 1 - Never  Interpreter Needed?: No  Information entered by :: Debra Haws, LPN   Activities of Daily Living     12/18/2023    3:15 PM  In your present state of health, do you have any difficulty performing the following activities:  Hearing? 0  Vision? 0  Difficulty concentrating or making decisions? 0  Walking or climbing stairs? 0  Dressing or bathing? 0  Doing errands, shopping? 0  Preparing Food and eating ? N  Using the Toilet? N  In the past six months, have you accidently leaked urine? Y  Comment panty liner  Do you have problems with loss of bowel control? N  Managing your Medications? N  Managing your Finances? N  Housekeeping or managing your Housekeeping? N    Patient Care Team: Jodie Lavern CROME, MD as PCP - General (Family Medicine) Jane Charleston, MD as Consulting Physician (Orthopedic Surgery) Lavoie, Marie-Lyne, MD as Consulting Physician (Obstetrics and Gynecology) Aron Shoulders, MD as Consulting Physician (General Surgery) Shannon Agent, MD as Consulting Physician (  Radiation Oncology) Crawford,  Morna Pickle, NP as Nurse Practitioner (Hematology and Oncology) Starla Wendelyn BIRCH, RN as Registered Nurse Iruku, Praveena, MD as Consulting Physician (Hematology and Oncology) Cam Morene ORN, MD as Attending Physician (Urology)  I have updated your Care Teams any recent Medical Services you may have received from other providers in the past year.     Assessment:   This is a routine wellness examination for Debra Simon.  Hearing/Vision screen Hearing Screening - Comments:: Pt denies any hearing issues  Vision Screening - Comments:: Wears rx glasses - up to date with routine eye exams with [facility]    Goals Addressed   None    Depression Screen     12/18/2023    3:17 PM 09/01/2023   10:11 AM 08/16/2023    8:19 AM 02/06/2023    2:20 PM 12/12/2022    3:14 PM 11/24/2022   11:18 AM 08/10/2022   10:26 AM  PHQ 2/9 Scores  PHQ - 2 Score 0 0 0 0 0 0 0    Fall Risk     12/18/2023    3:19 PM 09/01/2023   10:11 AM 08/16/2023    8:19 AM 07/25/2023    1:42 PM 02/06/2023    2:19 PM  Fall Risk   Falls in the past year? 0 0 0 0 0  Number falls in past yr: 0 0 0 0 0  Injury with Fall? 0 0 0 0 0  Risk for fall due to : No Fall Risks No Fall Risks No Fall Risks No Fall Risks No Fall Risks  Follow up Falls prevention discussed Falls evaluation completed Falls evaluation completed Falls evaluation completed Falls evaluation completed    MEDICARE RISK AT HOME:  Medicare Risk at Home Any stairs in or around the home?: Yes If so, are there any without handrails?: No Home free of loose throw rugs in walkways, pet beds, electrical cords, etc?: Yes Adequate lighting in your home to reduce risk of falls?: Yes Life alert?: No Use of a cane, walker or w/c?: No Grab bars in the bathroom?: Yes Shower chair or bench in shower?: Yes Elevated toilet seat or a handicapped toilet?: No  TIMED UP AND GO:  Was the test performed?  No  Cognitive Function: 6CIT completed        12/18/2023    3:19  PM 12/12/2022    3:17 PM 12/11/2020   10:37 AM 11/21/2019   10:47 AM  6CIT Screen  What Year? 0 points 0 points 0 points 0 points  What month? 0 points 0 points 0 points 0 points  What time? 0 points 0 points 0 points   Count back from 20 0 points 0 points 0 points 0 points  Months in reverse 0 points 0 points 0 points 2 points  Repeat phrase 0 points 0 points 0 points 0 points  Total Score 0 points 0 points 0 points     Immunizations Immunization History  Administered Date(s) Administered   Fluad Quad(high Dose 65+) 01/08/2020, 01/04/2021, 02/10/2022   Fluad Trivalent(High Dose 65+) 02/10/2023   INFLUENZA, HIGH DOSE SEASONAL PF 12/26/2017   Influenza,inj,Quad PF,6+ Mos 01/09/2019   Influenza-Unspecified 01/04/2021   Moderna Sars-Covid-2 Vaccination 07/26/2019, 08/23/2019   PFIZER(Purple Top)SARS-COV-2 Vaccination 03/05/2020   Pneumococcal Conjugate-13 12/26/2017   Pneumococcal Polysaccharide-23 09/27/2019   Tdap 01/22/2018   Zoster Recombinant(Shingrix) 07/22/2016, 11/18/2016    Screening Tests Health Maintenance  Topic Date Due   Influenza Vaccine  10/27/2023  COVID-19 Vaccine (4 - 2025-26 season) 11/27/2023   Mammogram  08/21/2024   Medicare Annual Wellness (AWV)  12/17/2024   Colonoscopy  02/13/2025   DEXA SCAN  08/18/2026   DTaP/Tdap/Td (2 - Td or Tdap) 01/23/2028   Pneumococcal Vaccine: 50+ Years  Completed   Hepatitis C Screening  Completed   Zoster Vaccines- Shingrix  Completed   HPV VACCINES  Aged Out   Meningococcal B Vaccine  Aged Out    Health Maintenance Items Addressed: See Nurse Notes at the end of this note  Additional Screening:  Vision Screening: Recommended annual ophthalmology exams for early detection of glaucoma and other disorders of the eye. Is the patient up to date with their annual eye exam?  Yes  Who is the provider or what is the name of the office in which the patient attends annual eye exams? [facility] [address]   Dental  Screening: Recommended annual dental exams for proper oral hygiene  Community Resource Referral / Chronic Care Management: CRR required this visit?  No   CCM required this visit?  No   Plan:    I have personally reviewed and noted the following in the patient's chart:   Medical and social history Use of alcohol, tobacco or illicit drugs  Current medications and supplements including opioid prescriptions. Patient is not currently taking opioid prescriptions. Functional ability and status Nutritional status Physical activity Advanced directives List of other physicians Hospitalizations, surgeries, and ER visits in previous 12 months Vitals Screenings to include cognitive, depression, and falls Referrals and appointments  In addition, I have reviewed and discussed with patient certain preventive protocols, quality metrics, and best practice recommendations. A written personalized care plan for preventive services as well as general preventive health recommendations were provided to patient.   Debra VEAR Haws, LPN   [id number]   After Visit Summary: (MyChart) Due to this being a telephonic visit, the after visit summary with patients personalized plan was offered to patient via MyChart   Notes: Nothing significant to report at this time.

## 2023-12-18 NOTE — Patient Instructions (Signed)
 Debra Simon,  Thank you for taking the time for your Medicare Wellness Visit. I appreciate your continued commitment to your health goals. Please review the care plan we discussed, and feel free to reach out if I can assist you further.  Medicare recommends these wellness visits once per year to help you and your care team stay ahead of potential health issues. These visits are designed to focus on prevention, allowing your provider to concentrate on managing your acute and chronic conditions during your regular appointments.  Please note that Annual Wellness Visits do not include a physical exam. Some assessments may be limited, especially if the visit was conducted virtually. If needed, we may recommend a separate in-person follow-up with your provider.  Ongoing Care Seeing your primary care provider every 3 to 6 months helps us  monitor your health and provide consistent, personalized care.   Referrals If a referral was made during today's visit and you haven't received any updates within two weeks, please contact the referred provider directly to check on the status.  Recommended Screenings:  Health Maintenance  Topic Date Due   Flu Shot  10/27/2023   COVID-19 Vaccine (4 - 2025-26 season) 11/27/2023   Medicare Annual Wellness Visit  12/12/2023   Breast Cancer Screening  08/21/2024   Colon Cancer Screening  02/13/2025   DEXA scan (bone density measurement)  08/18/2026   DTaP/Tdap/Td vaccine (2 - Td or Tdap) 01/23/2028   Pneumococcal Vaccine for age over 35  Completed   Hepatitis C Screening  Completed   Zoster (Shingles) Vaccine  Completed   HPV Vaccine  Aged Out   Meningitis B Vaccine  Aged Out       12/18/2023    3:19 PM  Advanced Directives  Does Patient Have a Medical Advance Directive? Yes  Type of Estate agent of [place];Living will  Copy of Healthcare Power of Attorney in Chart? No - copy requested   Advance Care Planning is important because  it: Ensures you receive medical care that aligns with your values, goals, and preferences. Provides guidance to your family and loved ones, reducing the emotional burden of decision-making during critical moments.  Vision: Annual vision screenings are recommended for early detection of glaucoma, cataracts, and diabetic retinopathy. These exams can also reveal signs of chronic conditions such as diabetes and high blood pressure.  Dental: Annual dental screenings help detect early signs of oral cancer, gum disease, and other conditions linked to overall health, including heart disease and diabetes.  Please see the attached documents for additional preventive care recommendations.

## 2023-12-21 DIAGNOSIS — M1712 Unilateral primary osteoarthritis, left knee: Secondary | ICD-10-CM | POA: Diagnosis not present

## 2024-01-08 ENCOUNTER — Ambulatory Visit

## 2024-01-23 ENCOUNTER — Other Ambulatory Visit: Payer: Self-pay | Admitting: Family Medicine

## 2024-02-08 ENCOUNTER — Inpatient Hospital Stay: Payer: Medicare HMO | Attending: Hematology and Oncology | Admitting: Hematology and Oncology

## 2024-02-08 VITALS — BP 152/72 | HR 82 | Temp 98.5°F | Resp 18 | Wt 152.1 lb

## 2024-02-08 DIAGNOSIS — Z800 Family history of malignant neoplasm of digestive organs: Secondary | ICD-10-CM | POA: Insufficient documentation

## 2024-02-08 DIAGNOSIS — Z923 Personal history of irradiation: Secondary | ICD-10-CM | POA: Insufficient documentation

## 2024-02-08 DIAGNOSIS — Z170 Estrogen receptor positive status [ER+]: Secondary | ICD-10-CM | POA: Diagnosis not present

## 2024-02-08 DIAGNOSIS — Z79811 Long term (current) use of aromatase inhibitors: Secondary | ICD-10-CM | POA: Insufficient documentation

## 2024-02-08 DIAGNOSIS — Z79899 Other long term (current) drug therapy: Secondary | ICD-10-CM | POA: Diagnosis not present

## 2024-02-08 DIAGNOSIS — C50512 Malignant neoplasm of lower-outer quadrant of left female breast: Secondary | ICD-10-CM | POA: Diagnosis not present

## 2024-02-08 DIAGNOSIS — Z1721 Progesterone receptor positive status: Secondary | ICD-10-CM | POA: Insufficient documentation

## 2024-02-08 DIAGNOSIS — Z803 Family history of malignant neoplasm of breast: Secondary | ICD-10-CM | POA: Insufficient documentation

## 2024-02-08 DIAGNOSIS — R5383 Other fatigue: Secondary | ICD-10-CM | POA: Insufficient documentation

## 2024-02-08 DIAGNOSIS — Z1732 Human epidermal growth factor receptor 2 negative status: Secondary | ICD-10-CM | POA: Diagnosis not present

## 2024-02-08 DIAGNOSIS — R531 Weakness: Secondary | ICD-10-CM | POA: Insufficient documentation

## 2024-02-08 DIAGNOSIS — Z808 Family history of malignant neoplasm of other organs or systems: Secondary | ICD-10-CM | POA: Insufficient documentation

## 2024-02-08 DIAGNOSIS — E559 Vitamin D deficiency, unspecified: Secondary | ICD-10-CM | POA: Diagnosis not present

## 2024-02-08 MED ORDER — ERGOCALCIFEROL 1.25 MG (50000 UT) PO CAPS: 50000.0000 [IU] | ORAL_CAPSULE | ORAL | 0 refills | Status: AC

## 2024-02-08 MED ORDER — ANASTROZOLE 1 MG PO TABS: 1.0000 mg | ORAL_TABLET | Freq: Every day | ORAL | 4 refills | Status: AC

## 2024-02-08 NOTE — Progress Notes (Unsigned)
 [facility]  [phone] [phone]     ID: Debra Simon DOB: 16-Nov-1950  MR#: [id number]  RDW#:[id number]  Patient Care Team: Jodie Lavern CROME, MD as PCP - General (Family Medicine) Jane Charleston, MD as Consulting Physician (Orthopedic Surgery) Aron Shoulders, MD as Consulting Physician (General Surgery) Shannon Agent, MD as Consulting Physician (Radiation Oncology) Crawford, Morna Pickle, NP as Nurse Practitioner (Hematology and Oncology) Starla Wendelyn BIRCH, RN as Registered Nurse Loretha Ash, MD as Consulting Physician (Hematology and Oncology) Cam Morene ORN, MD as Attending Physician (Urology) Ash Loretha, MD  CHIEF COMPLAINT: estrogen receptor positive breast cancer  CURRENT TREATMENT: Anastrozole   Discussed the use of AI scribe software for clinical note transcription with the patient, who gave verbal consent to proceed.  History of Present Illness    Discussed the use of AI scribe software for clinical note transcription with the patient, who gave verbal consent to proceed.  History of Present Illness Debra Simon is a 73 year old female with breast cancer who presents with fatigue and leg weakness.  She experiences significant fatigue and weakness in her legs, which has worsened since her last visit. Her legs feel heavy, making it difficult for her to walk. Previously, she was able to walk for about half an hour, but now she struggles to do so. Despite this, she is trying to stay active, but the fatigue persists.  She has a history of breast cancer diagnosed in 2022 and is currently taking anastrozole  as part of her treatment. Her medication regimen includes anastrozole , aspirin , cranberry supplements, losartan , potassium, probiotics, methenamine  for urinary tract infection prevention, and rosuvastatin . She had a mammogram in May.  She mentions having dry skin, which has been a persistent issue since last year. Additionally, she  experienced a urinary tract infection once this year, which is an improvement from previous years.  She recently had a vitamin D test while visiting [place], which indicated low levels. She is concerned about this as she has not had this test done locally before.  In terms of her social history, she used to walk regularly but has reduced her activity due to the fatigue and leg weakness.   Rest of the pertinent 10 point ROS reviewed and neg    COVID 19 VACCINATION STATUS: fully vaccinated, Moderna x2 with Pfizer booster 02/2020 as of August 2022   HISTORY OF CURRENT ILLNESS: From the original intake note:  Debra Simon had routine screening mammography on 08/06/2020 showing a possible abnormality in the left breast. She underwent left diagnostic mammography with tomography and left breast ultrasonography at [facility] on 08/28/2020 showing: breast density category C; 11 mm left breast mass at 6 o'clock, retroareolar; no left axillary lymphadenopathy.  Accordingly on 09/03/2020 she proceeded to biopsy of the left breast area in question. The pathology from this procedure ([id number]) showed: invasive ductal carcinoma, grade 1-2; ductal carcinoma in situ. Prognostic indicators significant for: estrogen receptor, >95% positive and progesterone receptor, >95% positive, both with strong staining intensity. Proliferation marker Ki67 at 15%. HER2 negative by immunohistochemistry (1+).   Cancer Staging  Malignant neoplasm of lower-outer quadrant of left breast of female, estrogen receptor positive (HCC) Staging form: Breast, AJCC 8th Edition - Clinical: Stage IA (cT1c, cN0, cM0, G2, ER+, PR+, HER2-) - Signed by Layla Sandria BROCKS, MD on 09/08/2020 Histologic grading system: 3 grade system   The patient's subsequent history is as detailed below.   PAST MEDICAL HISTORY: Past Medical History:  Diagnosis Date  .  Anxiety   . Arthritis   . Breast cancer (HCC)    left breast 2022  . Colon  polyps 2010  . Diastolic dysfunction, left ventricle 06/26/2016   (ECHO 2018): Left ventricle: The cavity size was normal. Systolic function was normal. The estimated ejection fraction was in the range of 60% to 65%. Wall motion was normal; there were no regional wall motion abnormalities. Doppler parameters are consistent with abnormal left ventricular relaxation (grade 1 diastolic dysfunction).  . Diverticulosis   . Family history of brain cancer   . Family history of breast cancer   . Family history of pancreatic cancer   . Family history of uterine cancer   . GERD (gastroesophageal reflux disease)    watches what she eats  . Headache    gets headaches when she eats sweets  . Heart murmur    dx last yr.   had echo 05/2016  . History of radiation therapy    Left breast- 11/18/20-12/17/20- Dr. Lynwood Nasuti  . Hyperlipidemia   . Hypertension   . Mild mitral regurgitation 07/04/2016  . Personal history of radiation therapy   . Pneumonia   . Pre-diabetes   . Prediabetes 11/11/2020  . Primary localized osteoarthritis of right knee 05/24/2017  . Ureterovaginal fistula   . Uterine fibroid 1994  . Vaginal dysplasia     PAST SURGICAL HISTORY: Past Surgical History:  Procedure Laterality Date  . BREAST CYST ASPIRATION Right   . BREAST EXCISIONAL BIOPSY Right   . BREAST LUMPECTOMY    . BREAST LUMPECTOMY WITH RADIOACTIVE SEED AND SENTINEL LYMPH NODE BIOPSY Left 10/15/2020   Procedure: LEFT BREAST LUMPECTOMY WITH RADIOACTIVE SEED AND SENTINEL LYMPH NODE BIOPSY;  Surgeon: Aron Shoulders, MD;  Location: [facility] OR;  Service: General;  Laterality: Left;  . COLONOSCOPY  2010  . COLONOSCOPY  2012  . COLONOSCOPY  2016  . CYSTOSCOPY    . ESOPHAGOGASTRODUODENOSCOPY  2010  . TOTAL ABDOMINAL HYSTERECTOMY  1994  . TOTAL KNEE ARTHROPLASTY Right 06/05/2017  . TOTAL KNEE ARTHROPLASTY Right 06/05/2017   Procedure: RIGHT TOTAL KNEE ARTHROPLASTY;  Surgeon: Jane Charleston, MD;  Location: [facility] OR;  Service:  Orthopedics;  Laterality: Right;  . Ueterovaginal fistual repair    . URETERAL EXPLORATION  04/16/1992   URETEROTOMY ON THE RIGHT, REPAIR LACERATION ON THE LEFT    FAMILY HISTORY: Family History  Problem Relation Age of Onset  . Diabetes Mother   . Hypertension Mother   . Brain cancer Father        d. 70  . Hypertension Sister   . Diabetes Sister   . Diabetes Brother   . Breast cancer Paternal Aunt        dx >50  . Uterine cancer Paternal Aunt        dx >50  . Cancer Maternal Grandmother        unknown type, died in her 68s  . Pancreatic cancer Cousin 41       maternal first cousin  . Uterine cancer Cousin        dx 20s, maternal first cousin  . Breast cancer Cousin 72       paternal first cousin   Her father died at age 48 from brain cancer. Her mother is 14 years old as of 08/2020. Copelyn has two brothers and two sisters. She reports breast cancer in a paternal aunt (over age 19), uterine cancer in a paternal aunt (>50) and a maternal cousin (48's), and pancreatic cancer in  a maternal cousin (age 62).   GYNECOLOGIC HISTORY:  No LMP recorded. Patient has had a hysterectomy. Menarche: 73 years old Age at first live birth: 73 years old GX P 4 LMP 02/1992, with hysterectomy Contraceptive: never used HRT never used  Hysterectomy? Yes, 02/1992 BSO? Yes?   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 Husband Debra works in [employer], now part time. She lives at home with her husband. Their daughter Debra Simon, age 86, is a engineer, site at [employer] here in [place]. Son Debra Simon, age 74, works in transit at a train station in [place] . Son Debra Simon, age 71, is a paramedic here in [place]. Son Debra Simon, age 6, is a paramedic in [place] . She attends a [employer].    ADVANCED DIRECTIVES: not in place. In the absence of any documentation to the contrary, the patient's spouse  is their HCPOA. She intends to name her son Debra Simon as her HCPOA.   HEALTH MAINTENANCE: Social History   Tobacco Use  . Smoking status: Never  . Smokeless tobacco: Never  Vaping Use  . Vaping status: Never Used  Substance Use Topics  . Alcohol use: No  . Drug use: No     Colonoscopy: 01/2020 (Dr. Legrand), recall 2026  PAP: 05/2020, negative  Bone density: 03/2017, -0.1   No Known Allergies  Current Outpatient Medications  Medication Sig Dispense Refill  . anastrozole  (ARIMIDEX ) 1 MG tablet TAKE 1 TABLET (1 MG TOTAL) BY MOUTH DAILY. START December 26, 2020 90 tablet 4  . aspirin  EC 81 MG tablet Take 1 tablet (81 mg total) by mouth daily.    . Cranberry 50 MG CHEW Chew by mouth.    . Lactobacillus (PROBIOTIC ACIDOPHILUS PO) Take by mouth.    . losartan -hydrochlorothiazide  (HYZAAR) 100-12.5 MG tablet TAKE 1 TABLET BY MOUTH EVERY DAY 90 tablet 3  . methenamine  (HIPREX ) 1 g tablet Take 1 tablet (1 g total) by mouth in the morning and at bedtime. Take with vit C 180 tablet 3  . potassium chloride  SA (KLOR-CON  M) 20 MEQ tablet TAKE 1 TABLET BY MOUTH EVERY DAY 90 tablet 3  . rosuvastatin  (CRESTOR ) 10 MG tablet Take 1 tablet (10 mg total) by mouth daily. 90 tablet 3   No current facility-administered medications for this visit.    OBJECTIVE: Spanish speaker who appears stated age  There were no vitals filed for this visit.      There is no height or weight on file to calculate BMI.   Wt Readings from Last 3 Encounters:  12/18/23 148 lb (67.1 kg)  09/01/23 143 lb 9.6 oz (65.1 kg)  08/16/23 146 lb (66.2 kg)      ECOG FS:1 - Symptomatic but completely ambulatory  Physical Exam Chest:     Comments: Bilateral breasts inspected. No palpable masses or regional adenopathy changes.  Left axilla with postsurgical changes. Musculoskeletal:     Cervical back: Normal range of motion and neck supple.  Lymphadenopathy:     Cervical: No cervical adenopathy.  Neurological:     Mental  Status: She is alert.     LAB RESULTS:  CMP     Component Value Date/Time   NA 132 (L) 08/16/2023 0857   K 3.6 08/16/2023 0857   CL 96 08/16/2023 0857   CO2 27 08/16/2023 0857   GLUCOSE 107 (H) 08/16/2023 0857   BUN 12 08/16/2023 0857   CREATININE  0.51 08/16/2023 0857   CREATININE 0.61 02/07/2022 1259   CREATININE 0.57 06/01/2016 1018   CALCIUM  9.6 08/16/2023 0857   CALCIUM  9.0 09/11/2012 0000   PROT 7.5 08/16/2023 0857   ALBUMIN 4.7 08/16/2023 0857   AST 19 08/16/2023 0857   AST 17 02/07/2022 1259   ALT 14 08/16/2023 0857   ALT 14 02/07/2022 1259   ALKPHOS 83 08/16/2023 0857   ALKPHOS 78 09/11/2012 0000   BILITOT 0.7 08/16/2023 0857   BILITOT 0.6 02/07/2022 1259   GFRNONAA >60 02/07/2022 1259   GFRAA >60 06/06/2017 0404    No results found for: TOTALPROTELP, ALBUMINELP, A1GS, A2GS, BETS, BETA2SER, GAMS, MSPIKE, SPEI  Lab Results  Component Value Date   WBC 4.1 08/16/2023   NEUTROABS 2.6 08/16/2023   HGB 13.1 08/16/2023   HCT 38.1 08/16/2023   MCV 87.2 08/16/2023   PLT 252.0 08/16/2023    No results found for: LABCA2  No components found for: [id number]  No results for input(s): INR in the last 168 hours.  No results found for: LABCA2  No results found for: RJW800  No results found for: CAN125  No results found for: CAN153  No results found for: CA2729  No components found for: HGQUANT  No results found for: CEA1, CEA / No results found for: CEA1, CEA   No results found for: AFPTUMOR  No results found for: CHROMOGRNA  No results found for: KPAFRELGTCHN, LAMBDASER, KAPLAMBRATIO (kappa/lambda light chains)  No results found for: HGBA, HGBA2QUANT, HGBFQUANT, HGBSQUAN (Hemoglobinopathy evaluation)   No results found for: LDH  No results found for: IRON, TIBC, IRONPCTSAT (Iron and TIBC)  No results found for: FERRITIN  Urinalysis    Component Value Date/Time    COLORURINE YELLOW 08/16/2023 0857   APPEARANCEUR CLEAR 08/16/2023 0857   LABSPEC 1.015 08/16/2023 0857   PHURINE 6.5 08/16/2023 0857   GLUCOSEU NEGATIVE 08/16/2023 0857   HGBUR SMALL (A) 08/16/2023 0857   BILIRUBINUR negative 09/01/2023 1025   KETONESUR NEGATIVE 08/16/2023 0857   PROTEINUR Positive (A) 09/01/2023 1025   PROTEINUR NEGATIVE 01/09/2019 1117   UROBILINOGEN 0.2 09/01/2023 1025   UROBILINOGEN 0.2 08/16/2023 0857   NITRITE negative 09/01/2023 1025   NITRITE NEGATIVE 08/16/2023 0857   LEUKOCYTESUR Moderate (2+) (A) 09/01/2023 1025   LEUKOCYTESUR NEGATIVE 08/16/2023 0857    STUDIES: No results found.   ELIGIBLE FOR AVAILABLE RESEARCH PROTOCOL: No  ASSESSMENT: 73 y.o. [place] woman s/p Left breast lower outer quadrant biopsy 09/03/2020 for a clinical T1c N0, stage IA invasive ductal carcinoma, grade I-II, estrogen and progesterone receptor positive, HER2 not amplified, with an Mib-1 of 15%  (1) genetics testing through the [facility] BRCAplus panel (report date 09/15/2020) or the CancerNext-Expanded + RNAinsight panel (report date 09/19/2020) found no deleterious mutations in ATM, BRCA1, BRCA2, CDH1, CHEK2, PALB2, PTEN, and TP53. The CancerNext-Expanded + RNAinsight gene panel offered by [employer] [facility] and includes sequencing and rearrangement analysis for the following 77 genes: AIP, ALK, APC, ATM, AXIN2, BAP1, BARD1, BLM, BMPR1A, BRCA1, BRCA2, BRIP1, CDC73, CDH1, CDK4, CDKN1B, CDKN2A, CHEK2, CTNNA1, DICER1, FANCC, FH, FLCN, GALNT12, KIF1B, LZTR1, MAX, MEN1, MET, MLH1, MSH2, MSH3, MSH6, MUTYH, NBN, NF1, NF2, NTHL1, PALB2, PHOX2B, PMS2, POT1, PRKAR1A, PTCH1, PTEN, RAD51C, RAD51D, RB1, RECQL, RET, SDHA, SDHAF2, SDHB, SDHC, SDHD, SMAD4, SMARCA4, SMARCB1, SMARCE1, STK11, SUFU, TMEM127, TP53, TSC1, TSC2, VHL and XRCC2 (sequencing and deletion/duplication); EGFR, EGLN1, HOXB13, KIT, MITF, PDGFRA, POLD1 and POLE (sequencing only); EPCAM and GREM1 (deletion/duplication only). RNA data is  routinely analyzed for use  in variant interpretation for all genes.  (2) status post left lumpectomy and sentinel lymph node sampling 10/15/2020 for a pT1c pN0, stage IA invasive ductal carcinoma, grade 2, with negative margins (A) a total of 5 left axillary lymph nodes were removed  (3) Oncotype score of 15 predicts a risk of recurrence outside the breast in the next 9 years of 4% if the patient's only systemic therapy is antiestrogens for 5 years.  It also predicts no benefit from chemotherapy  (4) adjuvant radiation 11/18/2020 through 12/17/2020 Site Technique Total Dose (Gy) Dose per Fx (Gy) Completed Fx Beam Energies  Breast, Left: Breast_Lt 3D 40.05/40.05 2.67 15/15 6XFFF  Breast, Left: Breast_Lt_Bst 3D 12/12 2 6/6 6X    (5) started anastrozole  12/26/2020 (A) [facility] 04/18/2017 was normal   PLAN: Assessment and Plan Assessment & Plan Breast cancer on anastrozole  therapy Breast cancer diagnosed in 2022, on anastrozole . Reports fatigue and leg weakness, likely medication side effects. Mammogram in May normal. Two years of therapy remain. - Continue anastrozole  therapy for two more years. - Order next mammogram for next year. - Send anastrozole  refill to pharmacy.  Fatigue and weakness of lower extremities Significant fatigue and leg weakness affecting mobility, possibly due to anastrozole . - Encourage daily 10-minute walks to improve leg strength and endurance.  Recurrent urinary tract infections on prophylactic therapy Recurrent UTIs with one episode this year. On methenamine  prophylaxis as per urology. - Continue methenamine  for UTI prophylaxis.  Vitamin D deficiency Vitamin D deficiency identified. Supplementation prescribed. - Prescribe vitamin D supplementation once a week for eight weeks. - Advise switch to OTC vitamin D after eight weeks.    Total time spent: 30 min  *Total Encounter Time as defined by the Centers for  Medicare and Medicaid Services includes, in addition to the face-to-face time of a patient visit (documented in the note above) non-face-to-face time: obtaining and reviewing outside history, ordering and reviewing medications, tests or procedures, care coordination (communications with other health care professionals or caregivers) and documentation in the medical record.

## 2024-02-15 DIAGNOSIS — N390 Urinary tract infection, site not specified: Secondary | ICD-10-CM | POA: Diagnosis not present

## 2024-02-19 ENCOUNTER — Encounter: Payer: Self-pay | Admitting: Family Medicine

## 2024-02-19 ENCOUNTER — Ambulatory Visit: Admitting: Family Medicine

## 2024-02-19 VITALS — BP 150/70 | HR 70 | Temp 97.9°F | Ht 59.0 in | Wt 151.0 lb

## 2024-02-19 DIAGNOSIS — R7303 Prediabetes: Secondary | ICD-10-CM | POA: Diagnosis not present

## 2024-02-19 DIAGNOSIS — M791 Myalgia, unspecified site: Secondary | ICD-10-CM

## 2024-02-19 DIAGNOSIS — E876 Hypokalemia: Secondary | ICD-10-CM

## 2024-02-19 DIAGNOSIS — H9193 Unspecified hearing loss, bilateral: Secondary | ICD-10-CM

## 2024-02-19 DIAGNOSIS — E559 Vitamin D deficiency, unspecified: Secondary | ICD-10-CM | POA: Diagnosis not present

## 2024-02-19 DIAGNOSIS — I10 Essential (primary) hypertension: Secondary | ICD-10-CM

## 2024-02-19 DIAGNOSIS — E782 Mixed hyperlipidemia: Secondary | ICD-10-CM | POA: Diagnosis not present

## 2024-02-19 DIAGNOSIS — T502X5A Adverse effect of carbonic-anhydrase inhibitors, benzothiadiazides and other diuretics, initial encounter: Secondary | ICD-10-CM

## 2024-02-19 LAB — LIPID PANEL
Cholesterol: 102 mg/dL (ref 0–200)
HDL: 52.1 mg/dL (ref >39.00)
LDL Cholesterol: 38 mg/dL (ref 0–99)
NonHDL: 50.39
Total CHOL/HDL Ratio: 2
Triglycerides: 64 mg/dL (ref 0.0–149.0)
VLDL: 12.8 mg/dL (ref 0.0–40.0)

## 2024-02-19 LAB — COMPREHENSIVE METABOLIC PANEL WITH GFR
ALT: 19 U/L (ref 0–35)
AST: 19 U/L (ref 0–37)
Albumin: 4.7 g/dL (ref 3.5–5.2)
Alkaline Phosphatase: 83 U/L (ref 39–117)
BUN: 9 mg/dL (ref 6–23)
CO2: 32 meq/L (ref 19–32)
Calcium: 9.5 mg/dL (ref 8.4–10.5)
Chloride: 96 meq/L (ref 96–112)
Creatinine, Ser: 0.49 mg/dL (ref 0.40–1.20)
GFR: 94.26 mL/min (ref >60.00)
Glucose, Bld: 108 mg/dL — ABNORMAL HIGH (ref 70–99)
Potassium: 4.1 meq/L (ref 3.5–5.1)
Sodium: 135 meq/L (ref 135–145)
Total Bilirubin: 0.6 mg/dL (ref 0.2–1.2)
Total Protein: 7 g/dL (ref 6.0–8.3)

## 2024-02-19 LAB — MAGNESIUM: Magnesium: 2 mg/dL (ref 1.5–2.5)

## 2024-02-19 LAB — POCT GLYCOSYLATED HEMOGLOBIN (HGB A1C): Hemoglobin A1C: 5.6 % (ref 4.0–5.6)

## 2024-02-19 LAB — CK: Total CK: 112 U/L (ref 17–177)

## 2024-02-19 NOTE — Progress Notes (Signed)
 Subjective  CC:  Chief Complaint  Patient presents with   Hypertension   Hyperlipidemia   Prediabetes    HPI: Debra Simon is a 73 y.o. female who presents to the office today to address the problems listed above in the chief complaint. Hypertension f/u:  Discussed the use of AI scribe software for clinical note transcription with the patient, who gave verbal consent to proceed.  History of Present Illness Debra Simon is a 73 year old female who presents with leg pain and fatigue.  Lower extremity fatigue and pain - Significant tiredness and heaviness in both legs, making them difficult to lift- ongoing problem for > 1 year. No claudication. Has h/o sciatica. Denies weakness. Bothers her. No chronic low back pain. - she is concerned it is caused by her medications.  She is on statin and arimedex.  - Sensation is distinct from pain due to severe knee arthritis - Received knee injections for pain relief, with improvement noted, especially during a recent trip to [place]  Tinnitus and hearing impairment - Persistent ringing in the ears - Decreased hearing ability, especially when spoken to from a distance - Presence of a substance in the ear resembling dried blood or insect wings, no pain. Uses q tips.    Nocturia and genitourinary symptoms; has h/o recurrent UTI - Frequent urination at night, up to four times - Less frequent urination during the day - Recent urology evaluation with urine testing - Sensation of vaginal pressure, associated with concern for urinary infection - to pick up antibiotics  Mucosal and skin dryness - Dryness of eyes, nose, and skin, especially the heels - Use of eye drops for ocular dryness  Vitamin d  supplementation - Recently initiated vitamin D  supplementation following a consultation in November  - VitD 21 when checked in August in [place]. Started high dose vit D yesterday.   HTN w/ white coat effect: reports nl BP at home   Assessment   1. White coat syndrome with diagnosis of hypertension   2. Prediabetes   3. Mixed hyperlipidemia   4. Myalgia   5. Diuretic-induced hypokalemia   6. Bilateral hearing loss, unspecified hearing loss type   7. Vitamin D  deficiency      Plan  Assessment and Plan Assessment & Plan Severe bilateral knee osteoarthritis with leg pain and fatigue Severe bilateral knee osteoarthritis with bone-on-bone changes, causing significant pain and fatigue. Previous injection provided temporary relief. Surgery recommended but not yet decided. Fatigue in legs may be related to arthritis, medication side effects, or low vitamin D  levels. Differential includes medication side effects and low vitamin D . - Continue working with Dr. Orpha for pain management. - Hold rosuvastatin  for 2-3 weeks to assess impact on leg pain. - Ensure vitamin D  levels are corrected. - Will consider further evaluation of lower back if symptoms persist. - heaviness in legs could be due to statin, arimedex, vit d deficiency or other. Diff dx includes lumbar stenosis.   Vitamin D  deficiency, under treatment Vitamin D  deficiency under treatment with prescription vitamin D . Treatment initiated recently. Low vitamin D  may contribute to leg pain. - Continue current vitamin D  prescription. - Will reassess vitamin D  levels after completion of current treatment.  Mixed hyperlipidemia, statin therapy on hold Mixed hyperlipidemia managed with rosuvastatin . Therapy on hold to assess impact on leg pain and fatigue. - Hold rosuvastatin  for 2-3 weeks. -will recheck lipids today; should be at goal now. Consider q10.   Hearing loss, under evaluation Hearing loss  with difficulty hearing close and distant sounds. Possible hearing aid consideration if hearing test indicates significant loss. - Performed hearing screen and failed. Refer to audiology. Ear drum appears normal.   Dry mouth, eyes, and nose (sicca symptoms) Sicca symptoms including  dry mouth, eyes, and nose. Possible contribution from mouth breathing at night and low humidity. - Use nasal saline drops for dry nose. - Use eye drops for dry eyes. - Use a humidifier in the bedroom to increase humidity.  I personally spent a total of 54 minutes in the care of the patient today including preparing to see the patient, getting/reviewing separately obtained history, counseling and educating, placing orders, documenting clinical information in the EHR, independently interpreting results, and communicating results.    Education regarding management of these chronic disease states was given. Management strategies discussed on successive visits include dietary and exercise recommendations, goals of achieving and maintaining IBW, and lifestyle modifications aiming for adequate sleep and minimizing stressors.  Follow up: 1 mo for recheck  Orders Placed This Encounter  Procedures   Lipid panel   Comprehensive metabolic panel with GFR   Magnesium   CK (Creatine Kinase)   Ambulatory referral to Audiology   POCT HgB A1C   No orders of the defined types were placed in this encounter.     BP Readings from Last 3 Encounters:  02/19/24 (!) 150/70  02/08/24 (!) 152/72  09/01/23 110/70   Wt Readings from Last 3 Encounters:  02/19/24 151 lb (68.5 kg)  02/08/24 152 lb 1.6 oz (69 kg)  12/18/23 148 lb (67.1 kg)    Lab Results  Component Value Date   CHOL 155 08/16/2023   CHOL 130 08/10/2022   CHOL 126 08/06/2021   Lab Results  Component Value Date   HDL 48.80 08/16/2023   HDL 51.50 08/10/2022   HDL 47.90 08/06/2021   Lab Results  Component Value Date   LDLCALC 92 08/16/2023   LDLCALC 61 08/10/2022   LDLCALC 61 08/06/2021   Lab Results  Component Value Date   TRIG 72.0 08/16/2023   TRIG 88.0 08/10/2022   TRIG 85.0 08/06/2021   Lab Results  Component Value Date   CHOLHDL 3 08/16/2023   CHOLHDL 3 08/10/2022   CHOLHDL 3 08/06/2021   No results found for:  LDLDIRECT Lab Results  Component Value Date   CREATININE 0.51 08/16/2023   BUN 12 08/16/2023   NA 132 (L) 08/16/2023   K 3.6 08/16/2023   CL 96 08/16/2023   CO2 27 08/16/2023    The 10-year ASCVD risk score (Arnett DK, et al., 2019) is: 19.9%   Values used to calculate the score:     Age: 71 years     Clincally relevant sex: Female     Is Non-Hispanic African American: No     Diabetic: No     Tobacco smoker: No     Systolic Blood Pressure: 150 mmHg     Is BP treated: Yes     HDL Cholesterol: 48.8 mg/dL     Total Cholesterol: 155 mg/dL  I reviewed the patients updated PMH, FH, and SocHx.    Patient Active Problem List   Diagnosis Date Noted   Prediabetes 11/11/2020    Priority: High   Malignant neoplasm of lower-outer quadrant of left breast of female, estrogen receptor positive (HCC) 09/07/2020    Priority: High   Serrated adenoma of colon 09/27/2019    Priority: High   Mixed hyperlipidemia 06/02/2016    Priority:  High   White coat syndrome with diagnosis of hypertension 07/31/2013    Priority: High   Diuretic-induced hypokalemia 02/10/2023    Priority: Medium    Blindness right eye category 3, blindness left eye category 3 09/09/2020    Priority: Medium    Umbilical hernia without obstruction and without gangrene 07/08/2020    Priority: Medium    History of lacunar cerebrovascular accident (CVA) 09/27/2019    Priority: Medium    Bilateral primary osteoarthritis of knee 06/05/2017    Priority: Medium    History of vaginal dysplasia 08/31/2016    Priority: Medium    Systolic murmur 08/06/2021    Priority: Low   Varicose veins of both lower extremities with pain 07/08/2020    Priority: Low   Diverticulosis     Priority: Low   Nonrheumatic mitral valve regurgitation 07/04/2016    Priority: Low   Sensorineural hearing loss (SNHL), bilateral 07/07/2015    Priority: Low   Rectocele 05/30/2014    Priority: Low   Encounter for breast and pelvic examination  07/25/2023   S/P total hysterectomy 07/25/2023   Personal history of breast cancer 07/25/2023   VAIN I (vaginal intraepithelial neoplasia grade I) 07/25/2023   Recurrent UTI (urinary tract infection) 02/10/2023   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  Social History: Patient  reports that she has never smoked. She has never used smokeless tobacco. She reports that she does not drink alcohol and does not use drugs.  Current Meds  Medication Sig   anastrozole  (ARIMIDEX ) 1 MG tablet Take 1 tablet (1 mg total) by mouth daily. Start December 26, 2020   aspirin  EC 81 MG tablet Take 1 tablet (81 mg total) by mouth daily.   Cranberry 50 MG CHEW Chew by mouth.   ergocalciferol  (VITAMIN D2) 1.25 MG (50000 UT) capsule Take 1 capsule (50,000 Units total) by mouth once a week.   Lactobacillus (PROBIOTIC ACIDOPHILUS PO) Take by mouth.   losartan -hydrochlorothiazide  (HYZAAR) 100-12.5 MG tablet TAKE 1 TABLET BY MOUTH EVERY DAY   methenamine  (HIPREX ) 1 g tablet Take 1 tablet (1 g total) by mouth in the morning and at bedtime. Take with vit C   potassium chloride  SA (KLOR-CON  M) 20 MEQ tablet TAKE 1 TABLET BY MOUTH EVERY DAY   rosuvastatin  (CRESTOR ) 10 MG tablet Take 1 tablet (10 mg total) by mouth daily.    Review of Systems: Cardiovascular: negative for chest pain, palpitations, leg swelling, orthopnea Respiratory: negative for SOB, wheezing or persistent cough Gastrointestinal: negative for abdominal pain Genitourinary: negative for dysuria or gross hematuria  Objective  Vitals: BP (!) 150/70   Pulse 70   Temp 97.9 F (36.6 C)   Ht 4' 11 (1.499 m)   Wt 151 lb (68.5 kg)   SpO2 98%   BMI 30.50  kg/m  General: no acute distress  Psych:  Alert and oriented, normal mood and affect HEENT:  Normocephalic, atraumatic, supple neck , nl TMs Cardiovascular:  RRR without murmur. no edema Respiratory:  Good breath sounds bilaterally, CTAB with normal respiratory effort Skin:  Warm, no rashes Neurologic:   Mental status is normal Commons side effects, risks, benefits, and alternatives for medications and treatment plan prescribed  today were discussed, and the patient expressed understanding of the given instructions. Patient is instructed to call or message via MyChart if he/she has any questions or concerns regarding our treatment plan. No barriers to understanding were identified. We discussed Red Flag symptoms and signs in detail. Patient expressed understanding regarding what to do in case of urgent or emergency type symptoms.  Medication list was reconciled, printed and provided to the patient in AVS. Patient instructions and summary information was reviewed with the patient as documented in the AVS. This note was prepared with assistance of Dragon voice recognition software. Occasional wrong-word or sound-a-like substitutions may have occurred due to the inherent limitation

## 2024-02-19 NOTE — Patient Instructions (Signed)
 Please return in 1 month for recheck  I will release your lab results to you on your MyChart account with further instructions. You may see the results before I do, but when I review them I will send you a message with my report or have my assistant call you if things need to be discussed. Please reply to my message with any questions. Thank you!   If you have any questions or concerns, please don't hesitate to send me a message via MyChart or call the office at [phone]. Thank you for visiting with us  today! It's our pleasure caring for you.    VISIT SUMMARY: Today, we discussed your leg pain and fatigue, tinnitus and hearing impairment, nocturnal headaches and sleep disturbance, frequent urination at night, anxiety symptoms, and dryness of mucosal and skin areas. We reviewed your current treatments and made some adjustments to help manage your symptoms better.  YOUR PLAN: -SEVERE BILATERAL KNEE OSTEOARTHRITIS WITH LEG PAIN AND FATIGUE: Severe bilateral knee osteoarthritis means that the cartilage in your knee joints is significantly worn down, causing pain and fatigue. We will continue working with Dr. Orpha for pain management, hold your rosuvastatin  for 2-3 weeks to see if it affects your leg pain, ensure your vitamin D  levels are corrected, and consider further evaluation of your lower back if symptoms persist.  -VITAMIN D  DEFICIENCY, UNDER TREATMENT: Vitamin D  deficiency means your body has low levels of vitamin D , which can contribute to leg pain. Continue taking your current vitamin D  prescription, and we will reassess your vitamin D  levels after you complete the current treatment.  -MIXED HYPERLIPIDEMIA, STATIN THERAPY ON HOLD: Mixed hyperlipidemia means you have high levels of different types of fats in your blood. We are holding your rosuvastatin  for 2-3 weeks to see if it impacts your leg pain and fatigue.  -HEARING LOSS, UNDER EVALUATION: Hearing loss means you have difficulty  hearing sounds both close and far away. We performed a hearing test today and will consider a hearing aid if the test shows significant hearing loss.  -DRY MOUTH, EYES, AND NOSE (SICCA SYMPTOMS): Sicca symptoms refer to dryness in your mouth, eyes, and nose. Use nasal saline drops for your dry nose, eye drops for your dry eyes, and a humidifier in your bedroom to increase humidity.  INSTRUCTIONS: Please follow up with Dr. Orpha for pain management. Hold your rosuvastatin  for 2-3 weeks and continue your current vitamin D  prescription. We will reassess your vitamin D  levels after you complete the treatment. Use nasal saline drops, eye drops, and a humidifier as recommended. If your hearing test indicates significant loss, we will consider a hearing aid.                      Contains text generated by [employer].                                 Contains text generated by [employer].

## 2024-02-29 ENCOUNTER — Ambulatory Visit: Payer: Self-pay | Admitting: Family Medicine

## 2024-02-29 NOTE — Progress Notes (Signed)
 See mychart note

## 2024-03-18 ENCOUNTER — Ambulatory Visit: Attending: General Surgery

## 2024-03-18 VITALS — Wt 150.0 lb

## 2024-03-18 DIAGNOSIS — Z483 Aftercare following surgery for neoplasm: Secondary | ICD-10-CM | POA: Insufficient documentation

## 2024-03-18 NOTE — Therapy (Signed)
 " OUTPATIENT PHYSICAL THERAPY [facility] SCREENING NOTE   Patient Name: Debra Simon MRN: [id number] DOB:1950/10/25, 73 y.o., female Today's Date: 03/18/2024  PCP: Jodie Lavern CROME, MD REFERRING PROVIDER: Aron Shoulders, MD   PT End of Session - 03/18/24 1544     Visit Number 1   # unchanged due to screen only   PT Start Time 1543    PT Stop Time 1547    PT Time Calculation (min) 4 min    Activity Tolerance Patient tolerated treatment well    Behavior During Therapy [facility] for tasks assessed/performed          Past Medical History:  Diagnosis Date   Anxiety    Arthritis    Breast cancer (HCC)    left breast 2022   Colon polyps 2010   Diastolic dysfunction, left ventricle 06/26/2016   (ECHO 2018): Left ventricle: The cavity size was normal. Systolic function was normal. The estimated ejection fraction was in the range of 60% to 65%. Wall motion was normal; there were no regional wall motion abnormalities. Doppler parameters are consistent with abnormal left ventricular relaxation (grade 1 diastolic dysfunction).   Diverticulosis    Family history of brain cancer    Family history of breast cancer    Family history of pancreatic cancer    Family history of uterine cancer    GERD (gastroesophageal reflux disease)    watches what she eats   Headache    gets headaches when she eats sweets   Heart murmur    dx last yr.   had echo 05/2016   History of radiation therapy    Left breast- 11/18/20-12/17/20- Dr. Lynwood Nasuti   Hyperlipidemia    Hypertension    Mild mitral regurgitation 07/04/2016   Personal history of radiation therapy    Pneumonia    Pre-diabetes    Prediabetes 11/11/2020   Primary localized osteoarthritis of right knee 05/24/2017   Ureterovaginal fistula    Uterine fibroid 1994   Vaginal dysplasia    Past Surgical History:  Procedure Laterality Date   BREAST CYST ASPIRATION Right    BREAST EXCISIONAL BIOPSY Right    BREAST LUMPECTOMY     BREAST LUMPECTOMY WITH  RADIOACTIVE SEED AND SENTINEL LYMPH NODE BIOPSY Left 10/15/2020   Procedure: LEFT BREAST LUMPECTOMY WITH RADIOACTIVE SEED AND SENTINEL LYMPH NODE BIOPSY;  Surgeon: Aron Shoulders, MD;  Location: [facility] OR;  Service: General;  Laterality: Left;   COLONOSCOPY  2010   COLONOSCOPY  2012   COLONOSCOPY  2016   CYSTOSCOPY     ESOPHAGOGASTRODUODENOSCOPY  2010   TOTAL ABDOMINAL HYSTERECTOMY  1994   TOTAL KNEE ARTHROPLASTY Right 06/05/2017   TOTAL KNEE ARTHROPLASTY Right 06/05/2017   Procedure: RIGHT TOTAL KNEE ARTHROPLASTY;  Surgeon: Jane Charleston, MD;  Location: [facility] OR;  Service: Orthopedics;  Laterality: Right;   Ueterovaginal fistual repair     URETERAL EXPLORATION  04/16/1992   URETEROTOMY ON THE RIGHT, REPAIR LACERATION ON THE LEFT   Patient Active Problem List   Diagnosis Date Noted   Encounter for breast and pelvic examination 07/25/2023   S/P total hysterectomy 07/25/2023   Personal history of breast cancer 07/25/2023   VAIN I (vaginal intraepithelial neoplasia grade I) 07/25/2023   Recurrent UTI (urinary tract infection) 02/10/2023   Diuretic-induced hypokalemia 02/10/2023   Idiopathic guttate hypomelanosis 02/10/2023   Systolic murmur 08/06/2021   Prediabetes 11/11/2020   Genetic testing 09/16/2020   Family history of breast cancer 09/10/2020   Family history of brain  cancer 09/10/2020   Family history of uterine cancer 09/10/2020   Family history of pancreatic cancer 09/10/2020   Blindness right eye category 3, blindness left eye category 3 09/09/2020   Malignant neoplasm of lower-outer quadrant of left breast of female, estrogen receptor positive (HCC) 09/07/2020   Varicose veins of both lower extremities with pain 07/08/2020   Umbilical hernia without obstruction and without gangrene 07/08/2020   History of lacunar cerebrovascular accident (CVA) 09/27/2019   Serrated adenoma of colon 09/27/2019   Bilateral primary osteoarthritis of knee 06/05/2017   Diverticulosis    History of  vaginal dysplasia 08/31/2016   Nonrheumatic mitral valve regurgitation 07/04/2016   Mixed hyperlipidemia 06/02/2016   Sensorineural hearing loss (SNHL), bilateral 07/07/2015   Rectocele 05/30/2014   White coat syndrome with diagnosis of hypertension 07/31/2013   Vitamin D  deficiency 07/31/2013    REFERRING DIAG: left breast cancer at risk for lymphedema  THERAPY DIAG: Aftercare following surgery for neoplasm  PERTINENT HISTORY: Patient was diagnosed on 08/28/2020 with left grade I-II invasive ductal carcinoma breast cancer. She had a left lumpectomy and sentinel node biopsy on 10/15/2020 with 5 negative axillary nodes removed. It is ER/PR positive and HER2 negative with a Ki67 of 15%.   PRECAUTIONS: left UE Lymphedema risk, None  SUBJECTIVE: Pt returns for her 6 month L-Dex screen.   PAIN:  Are you having pain? No  SOZO SCREENING: Patient was assessed today using the SOZO machine to determine the lymphedema index score. This was compared to her baseline score. It was determined that she is within the recommended range when compared to her baseline and no further action is needed at this time. She will continue SOZO screenings. These are done every 3 months for 2 years post operatively followed by every 6 months for 2 years, and then annually.    L-DEX FLOWSHEETS - 03/18/24 1500       L-DEX LYMPHEDEMA SCREENING   Measurement Type Unilateral    L-DEX MEASUREMENT EXTREMITY Upper Extremity    POSITION  Standing    DOMINANT SIDE Right    At Risk Side Left    BASELINE SCORE (UNILATERAL) 2.3    L-DEX SCORE (UNILATERAL) -0.4    VALUE CHANGE (UNILAT) -2.7         P: Cont 6 month L-Dex screens until 09/2024, then can transition to annual fo ras long as she would like to come.    Aden Berwyn Caldron, PTA 03/18/2024, 3:46 PM    "

## 2024-04-05 ENCOUNTER — Encounter: Payer: Self-pay | Admitting: Family Medicine

## 2024-04-05 ENCOUNTER — Ambulatory Visit: Admitting: Family Medicine

## 2024-04-05 VITALS — BP 124/78 | HR 72 | Temp 98.2°F | Ht 59.0 in | Wt 150.6 lb

## 2024-04-05 DIAGNOSIS — E559 Vitamin D deficiency, unspecified: Secondary | ICD-10-CM

## 2024-04-05 DIAGNOSIS — Z23 Encounter for immunization: Secondary | ICD-10-CM | POA: Diagnosis not present

## 2024-04-05 DIAGNOSIS — E782 Mixed hyperlipidemia: Secondary | ICD-10-CM

## 2024-04-05 DIAGNOSIS — I10 Essential (primary) hypertension: Secondary | ICD-10-CM | POA: Diagnosis not present

## 2024-04-05 DIAGNOSIS — M1712 Unilateral primary osteoarthritis, left knee: Secondary | ICD-10-CM | POA: Insufficient documentation

## 2024-04-05 NOTE — Patient Instructions (Signed)
 Please return in 4 months for cpe  If you have any questions or concerns, please don't hesitate to send me a message via MyChart or call the office at [phone]. Thank you for visiting with us  today! It's our pleasure caring for you.

## 2024-04-05 NOTE — Progress Notes (Signed)
 "  Subjective  CC:  Chief Complaint  Patient presents with   Hypertension   Knee Pain   Leg Pain    HPI: Debra Simon is a 74 y.o. female who presents to the office today to address the problems listed above in the chief complaint. Hypertension f/u:  Discussed the use of AI scribe software for clinical note transcription with the patient, who gave verbal consent to proceed.  History of Present Illness Debra Simon is a 74 year old female with knee arthritis and hyperlipidemia who presents for follow-up of leg pain and vitamin D  deficiency. See last note.  This is follow-up for lower leg heaviness, knee arthritis and vitamin D  deficiency. Overall she is doing much better than she was last month.  Lower extremity pain and heaviness - Persistent leg pain and heaviness, attributed to arthritis, complains of knee pain after climbing stairs etc. -No claudication symptoms. - Legs feel tired, especially during travel and stair use - Pain exacerbated by activities such as climbing stairs - Leg pain improved after discontinuing cholesterol medication for several weeks, but not fully resolved no other significant myalgias.  Hyperlipidemia is well-controlled by recent lab work.  Knee osteoarthritis - End-stage, has orthopedic.  Varicose veins - Large varicose vein in the leg with associated burning sensation  Vitamin d  deficiency - Completed twelve weeks of high-dose vitamin D  supplementation, with last dose scheduled for Sunday - Plans to continue with maintenance dose of 2000 units daily - Vitamin D  levels last checked in November with results from [place]  Hypertension is well-controlled at home.  He has known whitecoat syndrome.    Assessment  1. White coat syndrome with diagnosis of hypertension   2. Need for influenza vaccination   3. Mixed hyperlipidemia   4. Vitamin D  deficiency   5. Primary osteoarthritis of left knee      Plan  Assessment and Plan Assessment &  Plan Knee osteoarthritis Chronic knee osteoarthritis with pain exacerbated by travel and stair use. Conservative management is preferred unless symptoms become severe enough to warrant surgical intervention. - Continue conservative management with pain medication as needed.  Hyperlipidemia with statin-associated myalgia Hyperlipidemia previously managed with statins, discontinued due to myalgia. Symptoms improved after stopping statins. Plan to restart statins with coenzyme Q10 to mitigate myalgia. - Restart statin therapy. - Add coenzyme Q10 to be taken at night with statin to help with side effects.  Vitamin D  deficiency Managed with high-dose vitamin D  for 12 weeks, transitioning to maintenance dose. Levels to be rechecked to assess efficacy of treatment. - Continue high-dose vitamin D  for 12 weeks, then transition to 2000 IU daily. - Will recheck vitamin D  levels at next visit.  White coat hypertension Elevated blood pressure readings in clinical settings, likely due to anxiety. Blood pressure is well controlled at home. - Continue monitoring blood pressure at home. - continue current medications. Potassium and renal function were stable.   Leg sxs: multifactorial. Nl pedal pulses   Varicose veins of lower extremity Presence of large varicose veins in lower extremity with occasional burning sensation. Conservative management with occasional use of Advil for pain relief. - Use Advil occasionally for pain relief.     Education regarding management of these chronic disease states was given. Management strategies discussed on successive visits include dietary and exercise recommendations, goals of achieving and maintaining IBW, and lifestyle modifications aiming for adequate sleep and minimizing stressors.  Follow up: 4 mo for cpe  Orders Placed This Encounter  Procedures  Flu vaccine HIGH DOSE PF(Fluzone Trivalent)   No orders of the defined types were placed in this  encounter.     BP Readings from Last 3 Encounters:  04/05/24 124/78  02/19/24 (!) 150/70  02/08/24 (!) 152/72   Wt Readings from Last 3 Encounters:  04/05/24 150 lb 9.6 oz (68.3 kg)  03/18/24 150 lb (68 kg)  02/19/24 151 lb (68.5 kg)    Lab Results  Component Value Date   CHOL 102 02/19/2024   CHOL 155 08/16/2023   CHOL 130 08/10/2022   Lab Results  Component Value Date   HDL 52.10 02/19/2024   HDL 48.80 08/16/2023   HDL 51.50 08/10/2022   Lab Results  Component Value Date   LDLCALC 38 02/19/2024   LDLCALC 92 08/16/2023   LDLCALC 61 08/10/2022   Lab Results  Component Value Date   TRIG 64.0 02/19/2024   TRIG 72.0 08/16/2023   TRIG 88.0 08/10/2022   Lab Results  Component Value Date   CHOLHDL 2 02/19/2024   CHOLHDL 3 08/16/2023   CHOLHDL 3 08/10/2022   No results found for: LDLDIRECT Lab Results  Component Value Date   CREATININE 0.49 02/19/2024   BUN 9 02/19/2024   NA 135 02/19/2024   K 4.1 02/19/2024   CL 96 02/19/2024   CO2 32 02/19/2024    The ASCVD Risk score (Arnett DK, et al., 2019) failed to calculate for the following reasons:   The valid total cholesterol range is 130 to 320 mg/dL  I reviewed the patients updated PMH, FH, and SocHx.    Patient Active Problem List   Diagnosis Date Noted   Prediabetes 11/11/2020    Priority: High   Serrated adenoma of colon 09/27/2019    Priority: High   Mixed hyperlipidemia 06/02/2016    Priority: High   White coat syndrome with diagnosis of hypertension 07/31/2013    Priority: High   Diuretic-induced hypokalemia 02/10/2023    Priority: Medium    Blindness right eye category 3, blindness left eye category 3 09/09/2020    Priority: Medium    Umbilical hernia without obstruction and without gangrene 07/08/2020    Priority: Medium    History of lacunar cerebrovascular accident (CVA) 09/27/2019    Priority: Medium    Bilateral primary osteoarthritis of knee 06/05/2017    Priority: Medium     History of vaginal dysplasia 08/31/2016    Priority: Medium    Systolic murmur 08/06/2021    Priority: Low   Varicose veins of both lower extremities with pain 07/08/2020    Priority: Low   Diverticulosis     Priority: Low   Nonrheumatic mitral valve regurgitation 07/04/2016    Priority: Low   Sensorineural hearing loss (SNHL), bilateral 07/07/2015    Priority: Low   Rectocele 05/30/2014    Priority: Low   Primary osteoarthritis of left knee 04/05/2024   Encounter for breast and pelvic examination 07/25/2023   S/P total hysterectomy 07/25/2023   Personal history of breast cancer 07/25/2023   VAIN I (vaginal intraepithelial neoplasia grade I) 07/25/2023   Recurrent UTI (urinary tract infection) 02/10/2023   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  Social History: Patient  reports that she  has never smoked. She has never used smokeless tobacco. She reports that she does not drink alcohol and does not use drugs.  Active Medications[1]  Review of Systems: Cardiovascular: negative for chest pain, palpitations, leg swelling, orthopnea Respiratory: negative for SOB, wheezing or persistent cough Gastrointestinal: negative for abdominal pain Genitourinary: negative for dysuria or gross hematuria  Objective  Vitals: BP 124/78 Comment: home  Pulse 72   Temp 98.2 F (36.8 C)   Ht 4' 11 (1.499 m)   Wt 150 lb 9.6 oz (68.3 kg)   SpO2 99%   BMI 30.42 kg/m  General: no acute distress , appears well today Psych:  Alert and oriented, normal mood and affect HEENT:  Normocephalic, atraumatic, supple neck  Skin:  Warm, no rashes large varicosities on left Pedal pulses +2  bilaterally Neurologic:   Mental status is normal Commons side effects, risks, benefits, and alternatives for medications and treatment plan prescribed today were discussed, and the patient expressed understanding of the given instructions. Patient is instructed to call or message via MyChart if he/she has any questions or concerns regarding our treatment plan. No barriers to understanding were identified. We discussed Red Flag symptoms and signs in detail. Patient expressed understanding regarding what to do in case of urgent or emergency type symptoms.  Medication list was reconciled, printed and provided to the patient in AVS. Patient instructions and summary information was reviewed with the patient as documented in the AVS. This note was prepared with assistance of Dragon voice recognition software. Occasional wrong-word or sound-a-like substitutions may have occurred due to the inherent limitation    [1]  Current Meds  Medication Sig   anastrozole  (ARIMIDEX ) 1 MG tablet Take 1 tablet (1 mg total) by mouth daily. Start December 26, 2020   aspirin  EC 81 MG tablet Take 1 tablet (81 mg total) by mouth daily.   Cranberry 50 MG CHEW Chew by mouth.   ergocalciferol  (VITAMIN D2) 1.25 MG (50000 UT) capsule Take 1 capsule (50,000 Units total) by mouth once a week.   Lactobacillus (PROBIOTIC ACIDOPHILUS PO) Take by mouth.   losartan -hydrochlorothiazide  (HYZAAR) 100-12.5 MG tablet TAKE 1 TABLET BY MOUTH EVERY DAY   methenamine  (HIPREX ) 1 g tablet Take 1 tablet (1 g total) by mouth in the morning and at bedtime. Take with vit C   potassium chloride  SA (KLOR-CON  M) 20 MEQ tablet TAKE 1 TABLET BY MOUTH EVERY DAY   rosuvastatin  (CRESTOR ) 10 MG tablet Take 1 tablet (10 mg total) by mouth daily.   "

## 2024-07-25 ENCOUNTER — Encounter: Admitting: Obstetrics and Gynecology

## 2024-08-16 ENCOUNTER — Encounter: Admitting: Family Medicine

## 2024-09-16 ENCOUNTER — Ambulatory Visit

## 2024-12-23 ENCOUNTER — Ambulatory Visit

## 2025-02-07 ENCOUNTER — Inpatient Hospital Stay: Admitting: Hematology and Oncology
